# Patient Record
Sex: Female | Born: 1965 | Race: Black or African American | Hispanic: No | Marital: Married | Smoking: Never smoker
Health system: Southern US, Community
[De-identification: ages and names within clinical notes are randomized; demographics above are authoritative.]

## PROBLEM LIST (undated history)

## (undated) DIAGNOSIS — N3281 Overactive bladder: Secondary | ICD-10-CM

## (undated) DIAGNOSIS — L988 Other specified disorders of the skin and subcutaneous tissue: Secondary | ICD-10-CM

## (undated) DIAGNOSIS — D649 Anemia, unspecified: Secondary | ICD-10-CM

## (undated) DIAGNOSIS — F32A Depression, unspecified: Secondary | ICD-10-CM

## (undated) DIAGNOSIS — K602 Anal fissure, unspecified: Secondary | ICD-10-CM

## (undated) DIAGNOSIS — F329 Major depressive disorder, single episode, unspecified: Secondary | ICD-10-CM

## (undated) DIAGNOSIS — F419 Anxiety disorder, unspecified: Secondary | ICD-10-CM

## (undated) DIAGNOSIS — M1990 Unspecified osteoarthritis, unspecified site: Secondary | ICD-10-CM

## (undated) DIAGNOSIS — K625 Hemorrhage of anus and rectum: Secondary | ICD-10-CM

## (undated) HISTORY — DX: Unspecified osteoarthritis, unspecified site: M19.90

## (undated) HISTORY — PX: APPENDECTOMY: SHX54

## (undated) HISTORY — PX: COLONOSCOPY: SHX174

## (undated) HISTORY — DX: Anal fissure, unspecified: K60.2

## (undated) HISTORY — DX: Anxiety disorder, unspecified: F41.9

---

## 2009-03-17 ENCOUNTER — Emergency Department (HOSPITAL_BASED_OUTPATIENT_CLINIC_OR_DEPARTMENT_OTHER): Admission: EM | Admit: 2009-03-17 | Discharge: 2009-03-17 | Payer: Self-pay | Admitting: Emergency Medicine

## 2009-03-19 ENCOUNTER — Emergency Department (HOSPITAL_BASED_OUTPATIENT_CLINIC_OR_DEPARTMENT_OTHER): Admission: EM | Admit: 2009-03-19 | Discharge: 2009-03-19 | Payer: Self-pay | Admitting: Emergency Medicine

## 2009-03-20 ENCOUNTER — Emergency Department (HOSPITAL_BASED_OUTPATIENT_CLINIC_OR_DEPARTMENT_OTHER): Admission: EM | Admit: 2009-03-20 | Discharge: 2009-03-20 | Payer: Self-pay | Admitting: Emergency Medicine

## 2009-05-09 ENCOUNTER — Emergency Department (HOSPITAL_BASED_OUTPATIENT_CLINIC_OR_DEPARTMENT_OTHER): Admission: EM | Admit: 2009-05-09 | Discharge: 2009-05-09 | Payer: Self-pay | Admitting: Emergency Medicine

## 2009-09-15 ENCOUNTER — Emergency Department (HOSPITAL_BASED_OUTPATIENT_CLINIC_OR_DEPARTMENT_OTHER): Admission: EM | Admit: 2009-09-15 | Discharge: 2009-09-15 | Payer: Self-pay | Admitting: Emergency Medicine

## 2010-07-13 LAB — URINALYSIS, ROUTINE W REFLEX MICROSCOPIC
Bilirubin Urine: NEGATIVE
Glucose, UA: NEGATIVE mg/dL
Hgb urine dipstick: NEGATIVE
Ketones, ur: NEGATIVE mg/dL
Protein, ur: NEGATIVE mg/dL
Urobilinogen, UA: 0.2 mg/dL (ref 0.0–1.0)

## 2010-07-13 LAB — BASIC METABOLIC PANEL
BUN: 7 mg/dL (ref 6–23)
CO2: 29 mEq/L (ref 19–32)
Calcium: 8.7 mg/dL (ref 8.4–10.5)
Chloride: 102 mEq/L (ref 96–112)
Creatinine, Ser: 0.8 mg/dL (ref 0.4–1.2)
GFR calc Af Amer: >60 mL/min (ref >60)
Glucose, Bld: 97 mg/dL (ref 70–99)

## 2010-07-13 LAB — CBC
MCHC: 31.8 g/dL (ref 30.0–36.0)
MCV: 75.6 fL — ABNORMAL LOW (ref 78.0–100.0)
RBC: 5.48 MIL/uL — ABNORMAL HIGH (ref 3.87–5.11)
RDW: 15.3 % (ref 11.5–15.5)

## 2010-07-13 LAB — DIFFERENTIAL
Basophils Absolute: 0.2 10*3/uL — ABNORMAL HIGH (ref 0.0–0.1)
Basophils Relative: 3 % — ABNORMAL HIGH (ref 0–1)
Eosinophils Absolute: 0 10*3/uL (ref 0.0–0.7)
Monocytes Relative: 6 % (ref 3–12)
Neutro Abs: 5.2 10*3/uL (ref 1.7–7.7)
Neutrophils Relative %: 83 % — ABNORMAL HIGH (ref 43–77)

## 2010-07-30 LAB — CULTURE, ROUTINE-ABSCESS: Gram Stain: NONE SEEN

## 2010-11-22 ENCOUNTER — Other Ambulatory Visit: Payer: Self-pay

## 2010-11-22 ENCOUNTER — Encounter: Payer: Self-pay | Admitting: *Deleted

## 2010-11-22 ENCOUNTER — Emergency Department (INDEPENDENT_AMBULATORY_CARE_PROVIDER_SITE_OTHER): Payer: PRIVATE HEALTH INSURANCE

## 2010-11-22 ENCOUNTER — Emergency Department (HOSPITAL_BASED_OUTPATIENT_CLINIC_OR_DEPARTMENT_OTHER)
Admission: EM | Admit: 2010-11-22 | Discharge: 2010-11-22 | Disposition: A | Discharge status: Home / Self Care | Payer: PRIVATE HEALTH INSURANCE | Attending: Emergency Medicine | Admitting: Emergency Medicine

## 2010-11-22 DIAGNOSIS — R51 Headache: Secondary | ICD-10-CM | POA: Insufficient documentation

## 2010-11-22 DIAGNOSIS — I10 Essential (primary) hypertension: Secondary | ICD-10-CM | POA: Insufficient documentation

## 2010-11-22 LAB — BASIC METABOLIC PANEL
CO2: 26 mEq/L (ref 19–32)
Chloride: 108 mEq/L (ref 96–112)
GFR calc Af Amer: >60 mL/min (ref >60)
Potassium: 4.7 mEq/L (ref 3.5–5.1)

## 2010-11-22 MED ORDER — TRIAMTERENE-HCTZ 37.5-25 MG PO TABS: 1.0000 | ORAL_TABLET | Freq: Every day | ORAL | Status: DC

## 2010-11-22 MED ORDER — ACETAMINOPHEN 500 MG PO TABS
1000.0000 mg | ORAL_TABLET | Freq: Once | ORAL | Status: AC
  Administered 2010-11-22: 1000 mg via ORAL
  Filled 2010-11-22: qty 2

## 2010-11-22 NOTE — ED Notes (Signed)
Patient states she was taking triamterene/hctz and ran out last week, c/o HA and legs swelling since that time

## 2010-11-22 NOTE — ED Provider Notes (Addendum)
History     Chief Complaint  Patient presents with  . Headache    C/O HA & legs swelling   Patient is a 45 y.o. female presenting with headaches. The history is provided by the patient.  Headache  This is a recurrent problem. The current episode started more than 1 week ago. The problem occurs constantly. The problem has been gradually worsening. The headache is associated with nothing. The pain is located in the bilateral region. The quality of the pain is described as dull and throbbing. The pain is at a severity of 5/10. The pain is moderate. The pain does not radiate. Pertinent negatives include no anorexia, no fever, no malaise/fatigue, no chest pressure, no near-syncope, no shortness of breath, no nausea and no vomiting. She has tried nothing for the symptoms.   Pt says that she is out of her BP/fluid pill for awhile.  Is having increased swelling to legs which she gets when she is out of her meds.  Also bifrontal h/a which she has had off/on for six month.  Usually worse when her BP is high, but has not been seen for her h/a in past.  No the worst h/a of life.  No trauma, no fever.  No cp or sob Past Medical History  Diagnosis Date  . Hypertension     Past Surgical History  Procedure Date  . Appendectomy     History reviewed. No pertinent family history.  History  Substance Use Topics  . Smoking status: Never Smoker   . Smokeless tobacco: Not on file  . Alcohol Use: Yes    OB History    Grav Para Term Preterm Abortions TAB SAB Ect Mult Living                  Review of Systems  Constitutional: Negative for fever, malaise/fatigue, diaphoresis and fatigue.  HENT: Negative for congestion and facial swelling.   Eyes: Negative.   Respiratory: Negative.  Negative for shortness of breath.   Cardiovascular: Negative.  Negative for near-syncope.  Gastrointestinal: Negative.  Negative for nausea, vomiting and anorexia.  Genitourinary: Negative.   Musculoskeletal: Negative.    Skin: Negative.   Neurological: Positive for headaches. Negative for dizziness, speech difficulty, weakness and numbness.    Physical Exam  BP 124/85  Pulse 70  Temp(Src) 98.1 F (36.7 C) (Oral)  Resp 14  SpO2 100%  Physical Exam  Constitutional: She is oriented to person, place, and time. She appears well-developed and well-nourished.  HENT:  Head: Normocephalic and atraumatic.  Eyes: EOM are normal. Pupils are equal, round, and reactive to light.  Neck: Normal range of motion.  Cardiovascular: Normal rate, regular rhythm and normal heart sounds.   Pulmonary/Chest: Breath sounds normal.  Abdominal: Soft. Bowel sounds are normal.  Musculoskeletal: Normal range of motion. She exhibits edema.  Neurological: She is alert and oriented to person, place, and time.  Skin: Skin is warm and dry.    ED Course  Procedures EKG: normal EKG, normal sinus rhythm, unchanged from previous tracings, there are no previous tracings available for comparison, normal sinus rhythm, no ST/T wave changes, normal axis.  HR 51.  MDM CT head negative.  Nothing to suggest ICH or meningitis.  Will start back on BP med.  Has appt on August to see PCP      Rolan Bucco, MD 11/22/10 1155  Rolan Bucco, MD 11/22/10 1610  Rolan Bucco, MD 11/22/10 1234

## 2010-11-22 NOTE — Discharge Instructions (Signed)
hyper

## 2011-02-09 ENCOUNTER — Ambulatory Visit (INDEPENDENT_AMBULATORY_CARE_PROVIDER_SITE_OTHER): Payer: No Typology Code available for payment source | Admitting: Family Medicine

## 2011-02-09 ENCOUNTER — Encounter: Payer: Self-pay | Admitting: Family Medicine

## 2011-02-09 VITALS — BP 118/82 | HR 80 | Temp 98.5°F | Resp 16 | Ht 66.0 in | Wt 171.0 lb

## 2011-02-09 DIAGNOSIS — R609 Edema, unspecified: Secondary | ICD-10-CM

## 2011-02-09 LAB — POCT URINALYSIS DIPSTICK
Bilirubin, UA: NEGATIVE
Leukocytes, UA: NEGATIVE
Nitrite, UA: NEGATIVE
Urobilinogen, UA: 0.2
pH, UA: 5

## 2011-02-09 MED ORDER — TRIAMTERENE-HCTZ 37.5-25 MG PO TABS: 1.0000 | ORAL_TABLET | Freq: Every day | ORAL | Status: DC

## 2011-02-09 NOTE — Patient Instructions (Signed)
Edema Edema is an abnormal build-up of fluids in tissues. Because this is partly dependent on gravity (water flows to the lowest place), it is more common in the lower extremities (legs and thighs). It is also common in the looser tissues, like around the eyes. Painless swelling of the feet and ankles is common and increases as a person ages. It may affect both legs and may include the calves or even thighs. When squeezed, the fluid may move out of the affected area and may leave a dent for a few moments. CAUSES  Prolonged standing or sitting in one place for extended periods of time. Movement helps pump tissue fluid into the veins, and absence of movement prevents this, resulting in edema.   Varicose veins. The valves in the veins do not work as well as they should. This causes fluid to leak into the tissues.   Fluid and salt overload.   Injury, burn, or surgery to the leg, ankle, or foot, may damage veins and allow fluid to leak out.   Sunburn damages vessels. Leaky vessels allow fluid to go out into the sunburned tissues.   Allergies (from insect bites or stings, medications or chemicals) cause swelling by allowing vessels to become leaky.   Protein in the blood helps keep fluid in your vessels. Low protein, as in malnutrition, allows fluid to leak out.   Hormonal changes, including pregnancy and menstruation, cause fluid retention. This fluid may leak out of vessels and cause edema.   Medications that cause fluid retention. Examples are sex hormones, blood pressure medications, steroid treatment, or anti-depressants.   Some illnesses cause edema, especially heart failure, kidney disease, or liver disease.   Surgery that cuts veins or lymph nodes, such as surgery done for the heart or for breast cancer, may result in edema.  DIAGNOSIS Your caregiver is usually easily able to determine what is causing your swelling (edema) by simply asking what is wrong (getting a history) and examining  you (doing a physical). Sometimes x-rays, EKG (electrocardiogram or heart tracing), and blood work may be done to evaluate for underlying medical illness. TREATMENT General treatment includes:  Leg elevation (or elevation of the affected body part).   Restriction of fluid intake.   Prevention of fluid overload.   Compression of the affected body part. Compression with elastic bandages or support stockings squeezes the tissues, preventing fluid from entering and forcing it back into the blood vessels.   Diuretics (also called water pills or fluid pills) pull fluid out of your body in the form of increased urination. These are effective in reducing the swelling, but can have side effects and must be used only under your caregiver's supervision. Diuretics are appropriate only for some types of edema.  The specific treatment can be directed at any underlying causes discovered. Heart, liver, or kidney disease should be treated appropriately. HOME CARE INSTRUCTIONS  Elevate the legs (or affected body part) above the level of the heart, while lying down.   Avoid sitting or standing still for prolonged periods of time.   Avoid putting anything directly under the knees when lying down, and do not wear constricting clothing or garters on the upper legs.   Exercising the legs causes the fluid to work back into the veins and lymphatic channels. This may help the swelling go down.   The pressure applied by elastic bandages or support stockings can help reduce ankle swelling.   A low-salt diet may help reduce fluid retention and decrease the   ankle swelling.   Take any medications exactly as prescribed.  SEEK MEDICAL CARE IF:  Your edema is not responding to recommended treatments.  SEEK IMMEDIATE MEDICAL CARE IF:  You develop shortness of breath or chest pain.   You cannot breathe when you lay down; or if, while lying down, you have to get up and go to the window to get your breath.   You are  having increasing swelling without relief from treatment.   You develop a fever over 100.4.   You develop pain or redness in the areas that are swollen.   Tell your caregiver right away if you have gained 1-2 lbs in 1 day or  5 lbs in a week.  MAKE SURE YOU:  Understand these instructions.   Will watch your condition.   Will get help right away if you are not doing well or get worse.  Document Released: 04/13/2005 Document Re-Released: 10/01/2009 [employer] Patient Information 2011 [employer]. 

## 2011-02-09 NOTE — Progress Notes (Signed)
  Subjective:    Jo Compton is a 45 y.o. female who presents for follow-up of congestive heart failure. Current symptoms include: dyspnea and lower extremity edema. She denies chest pain, chest pressure/discomfort, claudication, exertional chest pressure/discomfort, fatigue, irregular heart beat, near-syncope, orthopnea, palpitations, paroxysmal nocturnal dyspnea, syncope and tachypnea. She states she is compliant all of the time with her medications. She states she is noncompliant much of the time with her diet.  The following portions of the patient's history were reviewed and updated as appropriate: allergies, current medications, past family history, past medical history, past social history, past surgical history and problem list.  Review of Systems Pertinent items are noted in HPI.   Objective:    BP 118/82  Pulse 80  Temp(Src) 98.5 F (36.9 C) (Oral)  Resp 16  Ht 5\' 6"  (1.676 m)  Wt 171 lb (77.565 kg)  BMI 27.60 kg/m2 General appearance: alert, cooperative, appears stated age and no distress Neck: no adenopathy, no carotid bruit, no JVD, supple, symmetrical, trachea midline and thyroid not enlarged, symmetric, no tenderness/mass/nodules Lungs: clear to auscultation bilaterally Extremities: edema trace left > right   Assessment:    Lower extremity edema  Plan:    Emphasized salt restriction. Encouraged daily monitoring of the patient's weight. Encouraged regular exercise. maxzide

## 2011-02-10 LAB — CBC WITH DIFFERENTIAL/PLATELET
Basophils Relative: 0.3 % (ref 0.0–3.0)
Eosinophils Absolute: 0 10*3/uL (ref 0.0–0.7)
Eosinophils Relative: 0.7 % (ref 0.0–5.0)
Lymphocytes Relative: 46.3 % — ABNORMAL HIGH (ref 12.0–46.0)
MCHC: 31.7 g/dL (ref 30.0–36.0)
Neutrophils Relative %: 45.2 % (ref 43.0–77.0)
RBC: 4.69 Mil/uL (ref 3.87–5.11)
WBC: 4.7 10*3/uL (ref 4.5–10.5)

## 2011-02-10 LAB — LIPID PANEL
HDL: 57.9 mg/dL (ref >39.00)
LDL Cholesterol: 95 mg/dL (ref 0–99)
Total CHOL/HDL Ratio: 3

## 2011-02-10 LAB — HEPATIC FUNCTION PANEL
ALT: 21 U/L (ref 0–35)
AST: 22 U/L (ref 0–37)
Albumin: 3.8 g/dL (ref 3.5–5.2)
Alkaline Phosphatase: 51 U/L (ref 39–117)
Bilirubin, Direct: 0 mg/dL (ref 0.0–0.3)
Total Bilirubin: 0.4 mg/dL (ref 0.3–1.2)
Total Protein: 6.9 g/dL (ref 6.0–8.3)

## 2011-02-10 LAB — BASIC METABOLIC PANEL WITH GFR
BUN: 10 mg/dL (ref 6–23)
CO2: 27 meq/L (ref 19–32)
Calcium: 8.7 mg/dL (ref 8.4–10.5)
Chloride: 106 meq/L (ref 96–112)
Creatinine, Ser: 0.8 mg/dL (ref 0.4–1.2)
GFR: 78.54 mL/min
Glucose, Bld: 86 mg/dL (ref 70–99)
Potassium: 4.2 meq/L (ref 3.5–5.1)
Sodium: 138 meq/L (ref 135–145)

## 2011-02-10 LAB — TSH: TSH: 0.74 u[IU]/mL (ref 0.35–5.50)

## 2011-03-02 ENCOUNTER — Emergency Department (INDEPENDENT_AMBULATORY_CARE_PROVIDER_SITE_OTHER): Payer: PRIVATE HEALTH INSURANCE

## 2011-03-02 ENCOUNTER — Encounter (HOSPITAL_BASED_OUTPATIENT_CLINIC_OR_DEPARTMENT_OTHER): Payer: Self-pay | Admitting: *Deleted

## 2011-03-02 ENCOUNTER — Emergency Department (HOSPITAL_BASED_OUTPATIENT_CLINIC_OR_DEPARTMENT_OTHER)
Admission: EM | Admit: 2011-03-02 | Discharge: 2011-03-02 | Disposition: A | Discharge status: Home / Self Care | Payer: PRIVATE HEALTH INSURANCE | Attending: Emergency Medicine | Admitting: Emergency Medicine

## 2011-03-02 DIAGNOSIS — I10 Essential (primary) hypertension: Secondary | ICD-10-CM | POA: Insufficient documentation

## 2011-03-02 DIAGNOSIS — M79669 Pain in unspecified lower leg: Secondary | ICD-10-CM

## 2011-03-02 DIAGNOSIS — M79609 Pain in unspecified limb: Secondary | ICD-10-CM

## 2011-03-02 DIAGNOSIS — R209 Unspecified disturbances of skin sensation: Secondary | ICD-10-CM

## 2011-03-02 LAB — CBC
HCT: 33.7 % — ABNORMAL LOW (ref 36.0–46.0)
Hemoglobin: 10.4 g/dL — ABNORMAL LOW (ref 12.0–15.0)
MCV: 73.4 fL — ABNORMAL LOW (ref 78.0–100.0)
WBC: 6.3 10*3/uL (ref 4.0–10.5)

## 2011-03-02 LAB — BASIC METABOLIC PANEL
BUN: 9 mg/dL (ref 6–23)
CO2: 26 mEq/L (ref 19–32)
Chloride: 102 mEq/L (ref 96–112)
Glucose, Bld: 100 mg/dL — ABNORMAL HIGH (ref 70–99)
Potassium: 3.7 mEq/L (ref 3.5–5.1)

## 2011-03-02 NOTE — ED Provider Notes (Signed)
History    Scribed for Geoffery Lyons, MD, the patient was seen in room MH09/MH09. This chart was scribed by Katha Cabal.   CSN: [id number] Arrival date & time: 03/02/2011  3:56 PM   First MD Initiated Contact with Patient 03/02/11 1558      Chief Complaint  Patient presents with  . Leg Pain    (Consider location/radiation/quality/duration/timing/severity/associated sxs/prior treatment) HPI Jo Compton is a 45 y.o. female who presents to the Emergency Department complaining of intermittent moderate to severe bilateral LE pain over the last couple days.  Patient reports that is worse in left LE.  Patient denies leg swelling, weakness and numbness.  Pain is associated with intermittent tingling in feet. Pain is described as cramping spasms. Pain has improved.  Patient reports pain is worse at night.   Pain is aggravated by walking.  Patient reports no exertion activities.  There is no history of smoking.  Patient is not taking OCPs.  Patient has history of HTN and is compliant with medication.     Past Medical History  Diagnosis Date  . Hypertension     Past Surgical History  Procedure Date  . Appendectomy     No family history on file.  History  Substance Use Topics  . Smoking status: Never Smoker   . Smokeless tobacco: Not on file  . Alcohol Use: Yes    OB History    Grav Para Term Preterm Abortions TAB SAB Ect Mult Living                  Review of Systems  All other systems reviewed and are negative.    Allergies  Review of patient's allergies indicates no known allergies.  Home Medications   Current Outpatient Rx  Name Route Sig Dispense Refill  . FERROUS SULFATE 325 (65 FE) MG PO TABS Oral Take 325 mg by mouth daily with breakfast.      . IBUPROFEN 200 MG PO TABS Oral Take 200 mg by mouth every 6 (six) hours as needed.      . TRIAMTERENE-HCTZ 37.5-25 MG PO TABS Oral Take 1 tablet by mouth daily. 30 tablet 0    BP 103/66  Pulse 67  Temp(Src) 97.8  F (36.6 C) (Oral)  Resp 17  Ht 5\' 6"  (1.676 m)  Wt 174 lb (78.926 kg)  BMI 28.08 kg/m2  SpO2 99%  LMP 02/22/2011  Physical Exam  Constitutional: She is oriented to person, place, and time. She appears well-developed and well-nourished. No distress.  HENT:  Head: Normocephalic and atraumatic.  Eyes: Conjunctivae and EOM are normal.  Neck: Normal range of motion. Neck supple.  Cardiovascular: Normal rate and intact distal pulses.        DP Pulse is palpable   Pulmonary/Chest: Effort normal. No respiratory distress.  Abdominal: Soft. There is no tenderness.  Musculoskeletal: Normal range of motion. She exhibits tenderness. She exhibits no edema.       No Homans sign present, no palpable cord behind the knee, soft tissue tenderness in left calf   Neurological: She is alert and oriented to person, place, and time.  Skin: Skin is warm and dry. No rash noted.  Psychiatric: She has a normal mood and affect. Her behavior is normal.    ED Course  Procedures (including critical care time)   DIAGNOSTIC STUDIES: Oxygen Saturation is 99% on room air, normal by my interpretation.    COORDINATION OF CARE:  4:06 PM  Physical exam complete.  Will  check for electrolyte imbalances.  Will order left LE ultrasound.  5:41 PM  Reviewed ultrasound.  No evidence of DVT in LLE.   5:43 PM  Discussed results of ultrasound with patient.  Plan to discharge patient.  Patient agrees with plan.     Orders Placed This Encounter  Procedures  . US Venous Img Lower Unilateral Left  . CBC  . Basic metabolic panel     LABS / RADIOLOGY:   Labs Reviewed  CBC - Abnormal; Notable for the following:    Hemoglobin 10.4 (*)    HCT 33.7 (*)    MCV 73.4 (*)    MCH 22.7 (*)    RDW 20.1 (*)    All other components within normal limits  BASIC METABOLIC PANEL - Abnormal; Notable for the following:    Glucose, Bld 100 (*)    All other components within normal limits   US Venous Img Lower Unilateral  Left  03/02/2011  *RADIOLOGY REPORT*  Clinical Data: Left leg pain with muscle spasms and tingling for 3- 4 days question deep venous thrombosis, history hypertension  LEFT LOWER EXTREMITY VENOUS DUPLEX ULTRASOUND  Technique:  Gray-scale sonography with graded compression, as well as color Doppler and duplex ultrasound, were performed to evaluate the deep venous system of the lower extremity from the level of the common femoral vein through the popliteal and proximal calf veins. Spectral Doppler was utilized to evaluate flow at rest and with distal augmentation maneuvers.  Comparison:  None  Findings: Deep venous system patent and compressible from left groin through popliteal fossa. Spontaneous venous flow present with intact augmentation and evidence of respiratory phasicity. No intraluminal thrombus identified. Visualized portion of the greater saphenous system unremarkable.  IMPRESSION: No evidence of deep venous thrombosis in left lower extremity.  Original Report Authenticated By: Lollie Marrow, M.D.         MDM   MDM: Labs okay, ultrasound negative for dvt.  Appears to be muscle strain.  Will treat with nsaids, rest.  Follow up prn.     MEDICATIONS GIVEN IN THE E.D. Scheduled Meds:   Continuous Infusions:       IMPRESSION: No diagnosis found.   DISCHARGE MEDICATIONS: New Prescriptions   No medications on file      I personally performed the services described in this documentation, which was scribed in my presence. The recorded information has been reviewed and considered.   Scribe            Geoffery Lyons, MD 03/02/11 [phone]

## 2011-03-02 NOTE — ED Notes (Signed)
Pt c/o bilat lower leg cramping that began over the weekend. Pain is currently in left lower leg and is worse with walking.

## 2011-03-02 NOTE — ED Notes (Signed)
Pt acuity changed from 4 to 3 based on plan of care. 

## 2011-03-13 ENCOUNTER — Ambulatory Visit: Payer: No Typology Code available for payment source | Admitting: Family Medicine

## 2011-03-13 ENCOUNTER — Encounter (HOSPITAL_BASED_OUTPATIENT_CLINIC_OR_DEPARTMENT_OTHER): Payer: Self-pay | Admitting: *Deleted

## 2011-03-13 DIAGNOSIS — Z0289 Encounter for other administrative examinations: Secondary | ICD-10-CM

## 2011-04-13 ENCOUNTER — Telehealth: Payer: Self-pay | Admitting: Family Medicine

## 2011-04-13 ENCOUNTER — Encounter (HOSPITAL_BASED_OUTPATIENT_CLINIC_OR_DEPARTMENT_OTHER): Payer: Self-pay | Admitting: *Deleted

## 2011-04-13 ENCOUNTER — Emergency Department (HOSPITAL_BASED_OUTPATIENT_CLINIC_OR_DEPARTMENT_OTHER)
Admission: EM | Admit: 2011-04-13 | Discharge: 2011-04-13 | Disposition: A | Discharge status: Home / Self Care | Payer: PRIVATE HEALTH INSURANCE | Attending: Emergency Medicine | Admitting: Emergency Medicine

## 2011-04-13 DIAGNOSIS — K625 Hemorrhage of anus and rectum: Secondary | ICD-10-CM | POA: Insufficient documentation

## 2011-04-13 DIAGNOSIS — I10 Essential (primary) hypertension: Secondary | ICD-10-CM | POA: Insufficient documentation

## 2011-04-13 LAB — CBC
HCT: 36.9 % (ref 36.0–46.0)
Hemoglobin: 11.9 g/dL — ABNORMAL LOW (ref 12.0–15.0)
MCH: 24.3 pg — ABNORMAL LOW (ref 26.0–34.0)
MCHC: 32.2 g/dL (ref 30.0–36.0)
MCV: 75.3 fL — ABNORMAL LOW (ref 78.0–100.0)

## 2011-04-13 LAB — COMPREHENSIVE METABOLIC PANEL
ALT: 15 U/L (ref 0–35)
BUN: 16 mg/dL (ref 6–23)
CO2: 25 mEq/L (ref 19–32)
Calcium: 9 mg/dL (ref 8.4–10.5)
Creatinine, Ser: 0.9 mg/dL (ref 0.50–1.10)
GFR calc Af Amer: 89 mL/min — ABNORMAL LOW (ref >90)
GFR calc non Af Amer: 77 mL/min — ABNORMAL LOW (ref >90)
Glucose, Bld: 91 mg/dL (ref 70–99)
Sodium: 136 mEq/L (ref 135–145)

## 2011-04-13 NOTE — Telephone Encounter (Signed)
Patient is having rectal bleeding but she is also having her period - she doesn't have  A bowel movement every day she wondered if it could be a hemmroid

## 2011-04-13 NOTE — ED Provider Notes (Signed)
History     CSN: [id number] Arrival date & time: 04/13/2011  7:28 PM   First MD Initiated Contact with Patient 04/13/11 1932      Chief Complaint  Patient presents with  . Rectal Bleeding    (Consider location/radiation/quality/duration/timing/severity/associated sxs/prior treatment) HPI History provided by pt.   Pt has had bright red blood in stool for the past week.  Associated w/ nausea.  Denies fever, abd pain, rectal pain, SOB, weakness, lightheadedness.  No GU sx; currently has her period but started yesterday.  Had colonoscopy 3-4 years ago that was normal.    Past Medical History  Diagnosis Date  . Hypertension     Past Surgical History  Procedure Date  . Appendectomy     Family History  Problem Relation Age of Onset  . Hypertension Mother   . COPD Mother   . Hypertension Maternal Aunt   . Cancer Maternal Uncle     colon  . Cancer Maternal Uncle     liver    History  Substance Use Topics  . Smoking status: Never Smoker   . Smokeless tobacco: Not on file  . Alcohol Use: Yes    OB History    Grav Para Term Preterm Abortions TAB SAB Ect Mult Living                  Review of Systems  All other systems reviewed and are negative.    Allergies  Review of patient's allergies indicates no known allergies.  Home Medications   Current Outpatient Rx  Name Route Sig Dispense Refill  . FERROUS SULFATE 325 (65 FE) MG PO TABS Oral Take 325 mg by mouth daily with breakfast.      . IBUPROFEN 200 MG PO TABS Oral Take 200 mg by mouth every 6 (six) hours as needed. For pain    . TRIAMTERENE-HCTZ 37.5-25 MG PO TABS Oral Take 1 each (1 tablet total) by mouth daily. 30 tablet 5    BP 118/86  Pulse 65  Temp(Src) 98.4 F (36.9 C) (Oral)  Resp 16  Ht 5\' 7"  (1.702 m)  Wt 170 lb (77.111 kg)  BMI 26.63 kg/m2  SpO2 100%  LMP 04/12/2011  Physical Exam  Nursing note and vitals reviewed. Constitutional: She is oriented to person, place, and time. She appears  well-developed and well-nourished. No distress.  HENT:  Head: Normocephalic and atraumatic.  Eyes: Conjunctivae are normal.       Normal appearance  Neck: Normal range of motion.  Cardiovascular: Normal rate and regular rhythm.   Pulmonary/Chest: Effort normal and breath sounds normal.  Abdominal: Soft. Bowel sounds are normal. She exhibits no distension. There is no tenderness.  Genitourinary:       Nml rectal tone.  Non-tender.  Stool color nml and no gross blood.   Neurological: She is alert and oriented to person, place, and time.  Skin: Skin is warm and dry. No rash noted.  Psychiatric: She has a normal mood and affect. Her behavior is normal.    ED Course  Procedures (including critical care time)  Labs Reviewed  CBC - Abnormal; Notable for the following:    Hemoglobin 11.9 (*)    MCV 75.3 (*)    MCH 24.3 (*)    RDW 19.1 (*)    All other components within normal limits  COMPREHENSIVE METABOLIC PANEL - Abnormal; Notable for the following:    Total Bilirubin 0.2 (*)    GFR calc non Af Amer 77 (*)  GFR calc Af Amer 89 (*)    All other components within normal limits  OCCULT BLOOD X 1 CARD TO LAB, STOOL   No results found.   1. Rectal bleeding       MDM  Pt presents w/ 1wk rectal bleeding w/ associated nausea only.  Nml colonoscopy 3-61yrs ago.  No gross blood in rectum and abd benign and non-tender on exam.   Hemoccult pos.  Hgb stable and .  Pt has a gastroenterologist in [place] but referred to [facility] as well.  She also has a PCP to f/u with.  Strict return precautions discussed.        Otilio Miu, [place] 04/13/11 2129

## 2011-04-13 NOTE — ED Notes (Signed)
Pt c/o bright red rectal bleeding with clots  X 1 week. sched appt  tomorrow with pmd.

## 2011-04-13 NOTE — Telephone Encounter (Signed)
Discussed with patient and she stated she has seen blood from the rectum x's 1 week when wiping. She stated she has been constipated some but had some relief from prune juice. She is currently on her menstrual cycle and she was not sure if she is still having rectal bleeding. Apt scheduled for tomorrow at 1:30 for an evaluation    KP

## 2011-04-14 ENCOUNTER — Ambulatory Visit: Payer: Self-pay | Admitting: Family Medicine

## 2011-04-14 ENCOUNTER — Telehealth: Payer: Self-pay | Admitting: Internal Medicine

## 2011-04-14 NOTE — ED Provider Notes (Signed)
Medical screening examination/treatment/procedure(s) were performed by non-physician practitioner and as supervising physician I was immediately available for consultation/collaboration.  Annai Heick, MD 04/14/11 0003 

## 2011-04-14 NOTE — Telephone Encounter (Signed)
Reviewed patient's ER record from 04/13/11 for rectal bleeding. She has an appointment today with her PCP Dr. Laury Axon also. Discussed with patient the need for [place] to have her previous GI records from [facility] prior to her visit here so the MD can properly treat her problem. She will keep her PCP appointment today. She will get her previous GI records faxed to [place] and keep the appointment on 04/22/11 with Dr. Christella Hartigan.

## 2011-04-15 NOTE — Telephone Encounter (Signed)
I agree with the disposition 

## 2011-04-22 ENCOUNTER — Ambulatory Visit: Payer: Self-pay | Admitting: Gastroenterology

## 2011-05-22 ENCOUNTER — Telehealth: Payer: Self-pay | Admitting: Gastroenterology

## 2011-05-22 NOTE — Telephone Encounter (Signed)
Message copied by Arna Snipe on Fri May 22, 2011  8:43 AM ------      Message from: Donata Duff      Created: Wed Apr 22, 2011  4:05 PM       Do not charge

## 2011-08-11 ENCOUNTER — Telehealth: Payer: Self-pay | Admitting: Family Medicine

## 2011-08-11 ENCOUNTER — Ambulatory Visit: Payer: Self-pay | Admitting: Internal Medicine

## 2011-08-11 NOTE — Telephone Encounter (Signed)
This is ok to wait for Arman Bogus

## 2011-08-11 NOTE — Telephone Encounter (Signed)
To accommodate patients schedule I put patient in a 4pm appointment for Mole removal 5.28.13. Can you please review the schedule & let me know if that is a problem. If so I will call patient back to re-schedule at a better time for Dr.Lowne

## 2011-08-17 NOTE — Telephone Encounter (Signed)
This is ok.   KP

## 2011-09-19 ENCOUNTER — Emergency Department (HOSPITAL_BASED_OUTPATIENT_CLINIC_OR_DEPARTMENT_OTHER)
Admission: EM | Admit: 2011-09-19 | Discharge: 2011-09-19 | Disposition: A | Discharge status: Home / Self Care | Payer: 59 | Attending: Emergency Medicine | Admitting: Emergency Medicine

## 2011-09-19 ENCOUNTER — Encounter (HOSPITAL_BASED_OUTPATIENT_CLINIC_OR_DEPARTMENT_OTHER): Payer: Self-pay | Admitting: *Deleted

## 2011-09-19 ENCOUNTER — Emergency Department (HOSPITAL_BASED_OUTPATIENT_CLINIC_OR_DEPARTMENT_OTHER): Payer: 59

## 2011-09-19 DIAGNOSIS — R05 Cough: Secondary | ICD-10-CM | POA: Insufficient documentation

## 2011-09-19 DIAGNOSIS — R059 Cough, unspecified: Secondary | ICD-10-CM | POA: Insufficient documentation

## 2011-09-19 DIAGNOSIS — R079 Chest pain, unspecified: Secondary | ICD-10-CM | POA: Insufficient documentation

## 2011-09-19 DIAGNOSIS — I10 Essential (primary) hypertension: Secondary | ICD-10-CM | POA: Insufficient documentation

## 2011-09-19 DIAGNOSIS — J40 Bronchitis, not specified as acute or chronic: Secondary | ICD-10-CM | POA: Insufficient documentation

## 2011-09-19 DIAGNOSIS — R490 Dysphonia: Secondary | ICD-10-CM | POA: Insufficient documentation

## 2011-09-19 MED ORDER — IBUPROFEN 800 MG PO TABS
800.0000 mg | ORAL_TABLET | Freq: Once | ORAL | Status: AC
  Administered 2011-09-19: 800 mg via ORAL
  Filled 2011-09-19: qty 1

## 2011-09-19 MED ORDER — ALBUTEROL SULFATE HFA 108 (90 BASE) MCG/ACT IN AERS
2.0000 | INHALATION_SPRAY | RESPIRATORY_TRACT | Status: DC | PRN
  Administered 2011-09-19: 2 via RESPIRATORY_TRACT
  Filled 2011-09-19: qty 6.7

## 2011-09-19 MED ORDER — OXYMETAZOLINE HCL 0.05 % NA SOLN
1.0000 | Freq: Once | NASAL | Status: AC
  Administered 2011-09-19: 1 via NASAL
  Filled 2011-09-19: qty 15

## 2011-09-19 NOTE — ED Provider Notes (Signed)
History   This chart was scribed for Jo Chick, MD by Shari Heritage. The patient was seen in room MH01/MH01. Patient's care was started at 2110.     CSN: [id number]  Arrival date & time 09/19/11  2110   First MD Initiated Contact with Patient 09/19/11 2211      Chief Complaint  Patient presents with  . Sore Throat  . Nasal Congestion  . Chest Pain    (Consider location/radiation/quality/duration/timing/severity/associated sxs/prior treatment) The history is provided by the patient. No language interpreter was used.   JOSLIN DOELL is a 46 y.o. female who presents to the Emergency Department complaining of productive cough onset 3 days ago with associated nausea, nasal congestion, sore throat and chest pain. Patient says she has been producing green and yellow mucous especially in the morning. Patient says that nasal congestion makes breathing a little more difficult. Patient also reports general aches. Patient has taken Tylenol, Claritin and Robitussin with no relief. Patient denies vomiting. Patient with h/o of HTN and appendectomy. Patient has never smoked. Patient w/o hx of asthma.  Past Medical History  Diagnosis Date  . Hypertension     Past Surgical History  Procedure Date  . Appendectomy     Family History  Problem Relation Age of Onset  . Hypertension Mother   . COPD Mother   . Hypertension Maternal Aunt   . Cancer Maternal Uncle     colon  . Cancer Maternal Uncle     liver    History  Substance Use Topics  . Smoking status: Never Smoker   . Smokeless tobacco: Never Used  . Alcohol Use: 0.6 oz/week    1 Glasses of wine per week    OB History    Grav Para Term Preterm Abortions TAB SAB Ect Mult Living                  Review of Systems A complete 10 system review of systems was obtained and all systems are negative except as noted in the HPI and PMH.   Allergies  Review of patient's allergies indicates no known allergies.  Home Medications    Current Outpatient Rx  Name Route Sig Dispense Refill  . ACETAMINOPHEN 500 MG PO TABS Oral Take 1,000 mg by mouth every 6 (six) hours as needed. Patient used this medication for headache and body aches.    Marland Kitchen DEXTROMETHORPHAN HBR 15 MG/5ML PO SYRP Oral Take 10 mLs by mouth 4 (four) times daily as needed. Patient used this medication for cold symptoms.    Di Kindle SULFATE 325 (65 FE) MG PO TABS Oral Take 325 mg by mouth daily with breakfast.      . CLARITIN PO Oral Take 1 tablet by mouth daily as needed. Patient used this medication for allergies.    . TRIAMTERENE-HCTZ 37.5-25 MG PO TABS Oral Take 1 each (1 tablet total) by mouth daily. 30 tablet 5    BP 107/80  Pulse 85  Temp(Src) 98.4 F (36.9 C) (Oral)  Resp 18  Ht 5\' 6"  (1.676 m)  Wt 175 lb (79.379 kg)  BMI 28.25 kg/m2  SpO2 100%  LMP 09/06/2011  Physical Exam  Nursing note and vitals reviewed. Constitutional: She is oriented to person, place, and time. She appears well-developed and well-nourished.  HENT:  Head: Normocephalic and atraumatic.  Mouth/Throat: Oropharynx is clear and moist.       No redness in throat. Voice is hoarse.  Eyes: Conjunctivae and EOM are  normal. Pupils are equal, round, and reactive to light.  Neck: Normal range of motion. Neck supple.  Cardiovascular: Normal rate and regular rhythm.   Pulmonary/Chest: Effort normal and breath sounds normal.  Abdominal: Soft. Bowel sounds are normal.  Musculoskeletal: Normal range of motion.       No leg swelling.  Neurological: She is alert and oriented to person, place, and time.  Skin: Skin is warm and dry.  Psychiatric: She has a normal mood and affect.    ED Course  Procedures (including critical care time) DIAGNOSTIC STUDIES: Oxygen Saturation is 100% on room air, normal by my interpretation.   BP- 107/80 Pulse- 85  COORDINATION OF CARE: 11:00PM- Patient informed of current plan for treatment and evaluation. Will prescribe an Albuterol inhaler and  afrin. Suggested that pt take Ibuprofen for body aches.   Date: 09/19/2011  Rate: 89  Rhythm: normal sinus rhythm  QRS Axis: normal  Intervals: normal  ST/T Wave abnormalities: normal  Conduction Disutrbances: none  Narrative Interpretation: unremarkable        Labs Reviewed - No data to display Dg Chest 2 View  09/19/2011  *RADIOLOGY REPORT*  Clinical Data: Chest pain, cough, congestion  CHEST - 2 VIEW  Comparison: None.  Findings: The heart size and pulmonary vascularity are normal. The lungs appear clear and expanded without focal air space disease or consolidation. No blunting of the costophrenic angles.  No pneumothorax.  IMPRESSION: No evidence of active pulmonary disease.  Original Report Authenticated By: Marlon Pel, M.D.     1. Bronchitis       MDM  Pt presenting with c/o body aches, productive cough and nasal congestion.  No fever.  CXR reassuring- lungs clear.  Pt overall well hydrated and nontoxic on exam.  Pt given albuterol MDI for likely bronchitis, as well as afrin nasal spray for congestion, ibuprofen for body aches.  Pt discharged with stirct return precuations.  She is agreeable with this plan.      I personally performed the services described in this documentation, which was scribed in my presence. The recorded information has been reviewed and considered.    Jo Chick, MD 09/20/11 [phone]

## 2011-09-19 NOTE — ED Notes (Signed)
Pt reports congestion sore throat and chest pain x 3 days- productive cough for green and yellow mucous

## 2011-09-19 NOTE — Discharge Instructions (Signed)
Return to the ED with any concerns including difficulty breathing, vomiting and not able to keep down liquids, decreased level of alertness/lethargy, or any other alarming symptoms  You should use albuterol inhaler 2 puffs every 4-6 hours for cough, also use afrin nasal spray twice daily for no more than 3 days- otherwise this may cause increased congestion.  Please be sure to drink plenty of fluids.

## 2011-09-22 ENCOUNTER — Encounter: Payer: Self-pay | Admitting: Family Medicine

## 2011-09-22 ENCOUNTER — Ambulatory Visit (INDEPENDENT_AMBULATORY_CARE_PROVIDER_SITE_OTHER): Payer: PRIVATE HEALTH INSURANCE | Admitting: Family Medicine

## 2011-09-22 VITALS — BP 120/84 | HR 71 | Temp 98.5°F | Wt 175.2 lb

## 2011-09-22 DIAGNOSIS — R110 Nausea: Secondary | ICD-10-CM

## 2011-09-22 DIAGNOSIS — Q828 Other specified congenital malformations of skin: Secondary | ICD-10-CM

## 2011-09-22 MED ORDER — OMEPRAZOLE MAGNESIUM 20 MG PO TBEC: 20.0000 mg | DELAYED_RELEASE_TABLET | Freq: Every day | ORAL | Status: DC

## 2011-09-22 NOTE — Patient Instructions (Signed)
Diet for GERD or PUD Nutrition therapy can help ease the discomfort of gastroesophageal reflux disease (GERD) and peptic ulcer disease (PUD).  HOME CARE INSTRUCTIONS   Eat your meals slowly, in a relaxed setting.   Eat 5 to 6 small meals per day.   If a food causes distress, stop eating it for a period of time.  FOODS TO AVOID  Coffee, regular or decaffeinated.   Cola beverages, regular or low calorie.   Tea, regular or decaffeinated.   Pepper.   Cocoa.   High fat foods, including meats.   Butter, margarine, hydrogenated oil (trans fats).   Peppermint or spearmint (if you have GERD).   Fruits and vegetables if not tolerated.   Alcohol.   Nicotine (smoking or chewing). This is one of the most potent stimulants to acid production in the gastrointestinal tract.   Any food that seems to aggravate your condition.  If you have questions regarding your diet, ask your caregiver or a registered dietitian. TIPS  Lying flat may make symptoms worse. Keep the head of your bed raised 6 to 9 inches (15 to 23 cm) by using a foam wedge or blocks under the legs of the bed.   Do not lay down until 3 hours after eating a meal.   Daily physical activity may help reduce symptoms.  MAKE SURE YOU:   Understand these instructions.   Will watch your condition.   Will get help right away if you are not doing well or get worse.  Document Released: 04/13/2005 Document Revised: 04/02/2011 Document Reviewed: 02/27/2011 [employer] Patient Information 2012 [employer]. 

## 2011-09-22 NOTE — Progress Notes (Signed)
  Skin Tag Removal Procedure Note  Pre-operative Diagnosis: Classic skin tags (acrochordon)  Post-operative Diagnosis: Classic skin tags (acrochordon)  Locations:lateral neck  Indications: irritated  Anesthesia: Lidocaine 1% without epinephrine without added sodium bicarbonate  Procedure Details  The risks (including bleeding and infection) and benefits of the procedure and Written informed consent obtained. Using sterile iris scissors, multiple skin tags were snipped off at their bases after cleansing with Betadine.  Bleeding was controlled by pressure.   Findings: Pathognomonic benign lesions  not sent for pathological exam.  Condition: Stable  Complications: none.  Plan: 1. Instructed to keep the wounds dry and covered for 24-48h and clean thereafter. 2. Warning signs of infection were reviewed.   3. Recommended that the patient use OTC acetaminophen as needed for pain.  4. Return as needed.  As an aside pt states she was seen in er for epigastric pain and vaginal bleeding and was found to have fibroids.  She has also had nausea.   She has a gyn and GI doctor.  She will call them.  prilosec was given to pt.

## 2011-10-02 ENCOUNTER — Other Ambulatory Visit: Payer: Self-pay

## 2011-10-02 ENCOUNTER — Other Ambulatory Visit: Payer: Self-pay | Admitting: Family Medicine

## 2011-10-02 NOTE — Telephone Encounter (Signed)
Rx sent 

## 2011-12-15 ENCOUNTER — Other Ambulatory Visit (HOSPITAL_COMMUNITY)
Admission: RE | Admit: 2011-12-15 | Discharge: 2011-12-15 | Disposition: A | Discharge status: Home / Self Care | Payer: 59 | Source: Ambulatory Visit | Attending: Obstetrics and Gynecology | Admitting: Obstetrics and Gynecology

## 2011-12-15 DIAGNOSIS — Z1151 Encounter for screening for human papillomavirus (HPV): Secondary | ICD-10-CM | POA: Insufficient documentation

## 2011-12-15 DIAGNOSIS — Z124 Encounter for screening for malignant neoplasm of cervix: Secondary | ICD-10-CM | POA: Insufficient documentation

## 2011-12-27 ENCOUNTER — Encounter (HOSPITAL_BASED_OUTPATIENT_CLINIC_OR_DEPARTMENT_OTHER): Payer: Self-pay | Admitting: Emergency Medicine

## 2011-12-27 ENCOUNTER — Emergency Department (HOSPITAL_BASED_OUTPATIENT_CLINIC_OR_DEPARTMENT_OTHER)
Admission: EM | Admit: 2011-12-27 | Discharge: 2011-12-27 | Disposition: A | Discharge status: Home / Self Care | Payer: 59 | Attending: Emergency Medicine | Admitting: Emergency Medicine

## 2011-12-27 DIAGNOSIS — N390 Urinary tract infection, site not specified: Secondary | ICD-10-CM | POA: Insufficient documentation

## 2011-12-27 DIAGNOSIS — I10 Essential (primary) hypertension: Secondary | ICD-10-CM | POA: Insufficient documentation

## 2011-12-27 LAB — URINALYSIS, ROUTINE W REFLEX MICROSCOPIC
Nitrite: NEGATIVE
Specific Gravity, Urine: 1.026 (ref 1.005–1.030)
Urobilinogen, UA: 1 mg/dL (ref 0.0–1.0)

## 2011-12-27 LAB — PREGNANCY, URINE: Preg Test, Ur: NEGATIVE

## 2011-12-27 LAB — URINE MICROSCOPIC-ADD ON

## 2011-12-27 MED ORDER — CIPROFLOXACIN HCL 500 MG PO TABS
500.0000 mg | ORAL_TABLET | Freq: Once | ORAL | Status: AC
  Administered 2011-12-27: 500 mg via ORAL
  Filled 2011-12-27: qty 1

## 2011-12-27 MED ORDER — CIPROFLOXACIN HCL 500 MG PO TABS: 500.0000 mg | ORAL_TABLET | Freq: Two times a day (BID) | ORAL | Status: AC

## 2011-12-27 NOTE — ED Notes (Signed)
Pt having dysuria for several days.  Started having lower back pain yesterday.  No known fever.

## 2011-12-27 NOTE — ED Provider Notes (Signed)
History     CSN: [id number]  Arrival date & time 12/27/11  1021   First MD Initiated Contact with Patient 12/27/11 1110      Chief Complaint  Patient presents with  . Dysuria    (Consider location/radiation/quality/duration/timing/severity/associated sxs/prior treatment) HPI Comments: Patient presents with a 3 to four-day history of burning on urination and urinary frequency. The last 2 days she's been having some crampy pain in her left lower back area. She denies any nausea or vomiting. Denies any fevers or chills. Denies any vaginal bleeding or discharge. She's had a constant throbbing pain in her lower back the last 2 days. Denies any blood in her urine.  The history is provided by the patient.    Past Medical History  Diagnosis Date  . Hypertension     Past Surgical History  Procedure Date  . Appendectomy     Family History  Problem Relation Age of Onset  . Hypertension Mother   . COPD Mother   . Hypertension Maternal Aunt   . Cancer Maternal Uncle     colon  . Cancer Maternal Uncle     liver    History  Substance Use Topics  . Smoking status: Never Smoker   . Smokeless tobacco: Never Used  . Alcohol Use: 0.6 oz/week    1 Glasses of wine per week    OB History    Grav Para Term Preterm Abortions TAB SAB Ect Mult Living                  Review of Systems  Constitutional: Negative for fever, chills, diaphoresis and fatigue.  HENT: Negative for congestion, rhinorrhea and sneezing.   Eyes: Negative.   Respiratory: Negative for cough, chest tightness and shortness of breath.   Cardiovascular: Negative for chest pain and leg swelling.  Gastrointestinal: Negative for nausea, vomiting, abdominal pain, diarrhea and blood in stool.  Genitourinary: Positive for dysuria and frequency. Negative for hematuria, flank pain and difficulty urinating.  Musculoskeletal: Positive for back pain. Negative for arthralgias.  Skin: Negative for rash.  Neurological: Negative  for dizziness, speech difficulty, weakness, numbness and headaches.    Allergies  Review of patient's allergies indicates no known allergies.  Home Medications   Current Outpatient Rx  Name Route Sig Dispense Refill  . ACETAMINOPHEN 500 MG PO TABS Oral Take 1,000 mg by mouth every 6 (six) hours as needed. Patient used this medication for headache and body aches.    Marland Kitchen CIPROFLOXACIN HCL 500 MG PO TABS Oral Take 1 tablet (500 mg total) by mouth every 12 (twelve) hours. 14 tablet 0  . FERROUS SULFATE 325 (65 FE) MG PO TABS Oral Take 325 mg by mouth daily with breakfast.      . TRIAMTERENE-HCTZ 37.5-25 MG PO TABS  TAKE 1 TABLET BY MOUTH EVERY DAY 30 tablet 4    BP 113/78  Pulse 70  Temp 98.2 F (36.8 C) (Oral)  Resp 16  SpO2 100%  LMP 12/13/2011  Physical Exam  Constitutional: She is oriented to person, place, and time. She appears well-developed and well-nourished.  HENT:  Head: Normocephalic and atraumatic.  Eyes: Pupils are equal, round, and reactive to light.  Neck: Normal range of motion. Neck supple.  Cardiovascular: Normal rate, regular rhythm and normal heart sounds.   Pulmonary/Chest: Effort normal and breath sounds normal. No respiratory distress. She has no wheezes. She has no rales. She exhibits no tenderness.  Abdominal: Soft. Bowel sounds are normal. There is  no tenderness. There is no rebound and no guarding.  Musculoskeletal: Normal range of motion. She exhibits no edema.  Lymphadenopathy:    She has no cervical adenopathy.  Neurological: She is alert and oriented to person, place, and time.  Skin: Skin is warm and dry. No rash noted.  Psychiatric: She has a normal mood and affect.    ED Course  Procedures (including critical care time)  Results for orders placed during the hospital encounter of 12/27/11  PREGNANCY, URINE      Component Value Range   Preg Test, Ur NEGATIVE  NEGATIVE  URINALYSIS, ROUTINE W REFLEX MICROSCOPIC      Component Value Range    Color, Urine YELLOW  YELLOW   APPearance CLOUDY (*) CLEAR   Specific Gravity, Urine 1.026  1.005 - 1.030   pH 5.5  5.0 - 8.0   Glucose, UA NEGATIVE  NEGATIVE mg/dL   Hgb urine dipstick NEGATIVE  NEGATIVE   Bilirubin Urine SMALL (*) NEGATIVE   Ketones, ur NEGATIVE  NEGATIVE mg/dL   Protein, ur NEGATIVE  NEGATIVE mg/dL   Urobilinogen, UA 1.0  0.0 - 1.0 mg/dL   Nitrite NEGATIVE  NEGATIVE   Leukocytes, UA TRACE (*) NEGATIVE  URINE MICROSCOPIC-ADD ON      Component Value Range   Squamous Epithelial / LPF FEW (*) RARE   WBC, UA 0-2  <3 WBC/hpf   Bacteria, UA MANY (*) RARE   Urine-Other MUCOUS PRESENT     No results found.    1. UTI (lower urinary tract infection)       MDM  Patient is well-appearing in no signs of a urinary tract infection. There's no fevers vomiting or other symptoms suggestive of pyelonephritis. There is no hematuria to suggest kidney stone. She has no vaginal symptoms. We'll treat her with Cipro advised her to followup with her primary care physician if no better within the next 23 days return here as needed for any worsening symptoms        Rolan Bucco, MD 12/27/11 1205

## 2012-04-15 ENCOUNTER — Other Ambulatory Visit: Payer: Self-pay | Admitting: Family Medicine

## 2012-04-15 MED ORDER — TRIAMTERENE-HCTZ 37.5-25 MG PO TABS: ORAL_TABLET | ORAL | Status: DC

## 2012-04-15 NOTE — Telephone Encounter (Signed)
refill  Triamterene-HCTZ (Tab) 37.5-25 MG TAKE 1 TABLET BY MOUTH EVERY DAY #30 wt/2-refills last fill 10.28.13--last ov 5.28.13 Skin tag removal--last labs here 10.15.12

## 2012-04-18 ENCOUNTER — Telehealth: Payer: Self-pay | Admitting: Family Medicine

## 2012-04-18 MED ORDER — TRIAMTERENE-HCTZ 37.5-25 MG PO TABS: ORAL_TABLET | ORAL | Status: DC

## 2012-04-18 NOTE — Telephone Encounter (Signed)
Patient calling to inquire about her blood pressure medication states that she put in a request several days ago and that pharmacy has also submitted a request.  Reviewed epic and script for Maxzide submitted to [facility] pharmacy on 04/15/12.  Patient states that she no longer uses [facility] and that she now uses [facility] [phone].    OFFICE NOTE: PLEASE NOTE CHANGE TO PATIENT PHARMACY PREFERENCE.

## 2012-04-18 NOTE — Telephone Encounter (Signed)
Pharmacy updated and Rx resent to correct pharmacy,

## 2012-05-27 ENCOUNTER — Other Ambulatory Visit: Payer: Self-pay | Admitting: Family Medicine

## 2012-06-07 ENCOUNTER — Encounter: Payer: Self-pay | Admitting: Family Medicine

## 2012-06-07 ENCOUNTER — Ambulatory Visit (INDEPENDENT_AMBULATORY_CARE_PROVIDER_SITE_OTHER): Payer: PRIVATE HEALTH INSURANCE | Admitting: Family Medicine

## 2012-06-07 VITALS — BP 100/60 | HR 70 | Temp 98.7°F | Ht 66.5 in | Wt 184.8 lb

## 2012-06-07 DIAGNOSIS — I739 Peripheral vascular disease, unspecified: Secondary | ICD-10-CM

## 2012-06-07 DIAGNOSIS — R609 Edema, unspecified: Secondary | ICD-10-CM

## 2012-06-07 LAB — POCT URINALYSIS DIPSTICK
Blood, UA: NEGATIVE
Protein, UA: NEGATIVE
Spec Grav, UA: 1.01
Urobilinogen, UA: 0.2
pH, UA: 7.5

## 2012-06-07 LAB — HEPATIC FUNCTION PANEL
AST: 18 U/L (ref 0–37)
Total Bilirubin: 0 mg/dL — ABNORMAL LOW (ref 0.3–1.2)

## 2012-06-07 LAB — BASIC METABOLIC PANEL
BUN: 12 mg/dL (ref 6–23)
Calcium: 9.3 mg/dL (ref 8.4–10.5)
Creatinine, Ser: 0.8 mg/dL (ref 0.4–1.2)
GFR: 95.77 mL/min (ref >60.00)
Glucose, Bld: 81 mg/dL (ref 70–99)

## 2012-06-07 LAB — CBC WITH DIFFERENTIAL/PLATELET
Basophils Absolute: 0 10*3/uL (ref 0.0–0.1)
HCT: 39 % (ref 36.0–46.0)
Lymphs Abs: 2 10*3/uL (ref 0.7–4.0)
Monocytes Relative: 10.2 % (ref 3.0–12.0)
Platelets: 282 10*3/uL (ref 150.0–400.0)
RDW: 14 % (ref 11.5–14.6)

## 2012-06-07 LAB — LIPID PANEL
Cholesterol: 160 mg/dL (ref 0–200)
LDL Cholesterol: 91 mg/dL (ref 0–99)
Triglycerides: 101 mg/dL (ref 0.0–149.0)
VLDL: 20.2 mg/dL (ref 0.0–40.0)

## 2012-06-07 MED ORDER — FUROSEMIDE 20 MG PO TABS: 20.0000 mg | ORAL_TABLET | Freq: Every day | ORAL | Status: DC

## 2012-06-07 NOTE — Patient Instructions (Signed)
Edema Edema is an abnormal build-up of fluids in tissues. Because this is partly dependent on gravity (water flows to the lowest place), it is more common in the legs and thighs (lower extremities). It is also common in the looser tissues, like around the eyes. Painless swelling of the feet and ankles is common and increases as a person ages. It may affect both legs and may include the calves or even thighs. When squeezed, the fluid may move out of the affected area and may leave a dent for a few moments. CAUSES   Prolonged standing or sitting in one place for extended periods of time. Movement helps pump tissue fluid into the veins, and absence of movement prevents this, resulting in edema.  Varicose veins. The valves in the veins do not work as well as they should. This causes fluid to leak into the tissues.  Fluid and salt overload.  Injury, burn, or surgery to the leg, ankle, or foot, may damage veins and allow fluid to leak out.  Sunburn damages vessels. Leaky vessels allow fluid to go out into the sunburned tissues.  Allergies (from insect bites or stings, medications or chemicals) cause swelling by allowing vessels to become leaky.  Protein in the blood helps keep fluid in your vessels. Low protein, as in malnutrition, allows fluid to leak out.  Hormonal changes, including pregnancy and menstruation, cause fluid retention. This fluid may leak out of vessels and cause edema.  Medications that cause fluid retention. Examples are sex hormones, blood pressure medications, steroid treatment, or anti-depressants.  Some illnesses cause edema, especially heart failure, kidney disease, or liver disease.  Surgery that cuts veins or lymph nodes, such as surgery done for the heart or for breast cancer, may result in edema. DIAGNOSIS  Your caregiver is usually easily able to determine what is causing your swelling (edema) by simply asking what is wrong (getting a history) and examining you (doing  a physical). Sometimes x-rays, EKG (electrocardiogram or heart tracing), and blood work may be done to evaluate for underlying medical illness. TREATMENT  General treatment includes:  Leg elevation (or elevation of the affected body part).  Restriction of fluid intake.  Prevention of fluid overload.  Compression of the affected body part. Compression with elastic bandages or support stockings squeezes the tissues, preventing fluid from entering and forcing it back into the blood vessels.  Diuretics (also called water pills or fluid pills) pull fluid out of your body in the form of increased urination. These are effective in reducing the swelling, but can have side effects and must be used only under your caregiver's supervision. Diuretics are appropriate only for some types of edema. The specific treatment can be directed at any underlying causes discovered. Heart, liver, or kidney disease should be treated appropriately. HOME CARE INSTRUCTIONS   Elevate the legs (or affected body part) above the level of the heart, while lying down.  Avoid sitting or standing still for prolonged periods of time.  Avoid putting anything directly under the knees when lying down, and do not wear constricting clothing or garters on the upper legs.  Exercising the legs causes the fluid to work back into the veins and lymphatic channels. This may help the swelling go down.  The pressure applied by elastic bandages or support stockings can help reduce ankle swelling.  A low-salt diet may help reduce fluid retention and decrease the ankle swelling.  Take any medications exactly as prescribed. SEEK MEDICAL CARE IF:  Your edema is   not responding to recommended treatments. SEEK IMMEDIATE MEDICAL CARE IF:   You develop shortness of breath or chest pain.  You cannot breathe when you lay down; or if, while lying down, you have to get up and go to the window to get your breath.  You are having increasing  swelling without relief from treatment.  You develop a fever over 102 F (38.9 C).  You develop pain or redness in the areas that are swollen.  Tell your caregiver right away if you have gained 3 lb/1.4 kg in 1 day or 5 lb/2.3 kg in a week. MAKE SURE YOU:   Understand these instructions.  Will watch your condition.  Will get help right away if you are not doing well or get worse. Document Released: 04/13/2005 Document Revised: 10/13/2011 Document Reviewed: 11/30/2007 [employer] Patient Information 2013 [employer].  

## 2012-06-07 NOTE — Progress Notes (Signed)
  Subjective:    Jo Compton is a 47 y.o. female who presents for evaluation of edema in both ankles and feet and both lower legs. The edema has been severe. Onset of symptoms was 2 years ago, and patient reports symptoms have gradually worsened since that time. The edema is present all day. The patient states the problem is long-standing. The swelling has been aggravated by dependency of involved area and walking. The swelling has been relieved by nothing. Associated factors include: nothing.  Pt c/o pain and tingling in feet and legs with walking and it goes away when she rests.  Cardiac risk factors: hypertension.  The following portions of the patient's history were reviewed and updated as appropriate:  She  has a past medical history of Hypertension. She  does not have a problem list on file. She  has past surgical history that includes Appendectomy. Her family history includes COPD in her mother; Cancer in her maternal uncles; and Hypertension in her maternal aunt and mother. She  reports that she has never smoked. She has never used smokeless tobacco. She reports that she drinks about 0.6 ounces of alcohol per week. She reports that she does not use illicit drugs. She has a current medication list which includes the following prescription(s): acetaminophen, ferrous sulfate, and furosemide. Current Outpatient Prescriptions on File Prior to Visit  Medication Sig Dispense Refill  . acetaminophen (TYLENOL) 500 MG tablet Take 1,000 mg by mouth every 6 (six) hours as needed. Patient used this medication for headache and body aches.      . ferrous sulfate 325 (65 FE) MG tablet Take 325 mg by mouth daily with breakfast.         No current facility-administered medications on file prior to visit.   She has No Known Allergies..  Review of Systems Pertinent items are noted in HPI.   Objective:    BP 100/60  Pulse 70  Temp(Src) 98.7 F (37.1 C) (Oral)  Ht 5' 6.5" (1.689 m)  Wt 184 lb 12.8  oz (83.825 kg)  BMI 29.38 kg/m2  SpO2 99% General appearance: alert, cooperative, appears stated age and no distress Lungs: clear to auscultation bilaterally Heart: S1, S2 normal Extremities: edema trace pitting and no calf pain   Cardiographics ECG: not done  Imaging Chest x-ray: not indicated   Assessment:     Edema .    fatigue ? Claudication-- check art dopplers Plan:    Recommendations: decrease sodium in the diet, elevate feet above the level of the heart whenever possible, increase physical activity and use of compression stockings. The patient was also instructed to call IMMEDIATELY (i.e., day or night) if any cardiopulmonary symptoms occur, especially chest pain, shortness of breath, dyspnea on exertion, paroxysmal nocturnal dyspnea, or orthopnea, and these were explained. Follow up in 2 weeks and as needed.

## 2012-06-07 NOTE — Progress Notes (Deleted)
  Subjective:    Patient ID: Jo Compton, female    DOB: [birth year], 47 y.o.   MRN: [id number]  HPI   Review of Systems     Objective:   Physical Exam        Assessment & Plan:

## 2012-06-14 ENCOUNTER — Encounter: Payer: Self-pay | Admitting: Family Medicine

## 2012-06-16 IMAGING — US US EXTREM LOW VENOUS*L*
1 series · 14 of 20 positions shown · non-contrast
Comparison: None

CLINICAL DATA: Left leg pain with muscle spasms and tingling for 3-
4 days question deep venous thrombosis, history hypertension

LEFT LOWER EXTREMITY VENOUS DUPLEX ULTRASOUND
TECHNIQUE: Gray-scale sonography with graded compression, as well
as color Doppler and duplex ultrasound, were performed to evaluate
the deep venous system of the lower extremity from the level of the
common femoral vein through the popliteal and proximal calf veins.
Spectral Doppler was utilized to evaluate flow at rest and with
distal augmentation maneuvers.

[Series 1: us extrem low venous*left* · 14 of 20 slices shown]
[im 1/20]
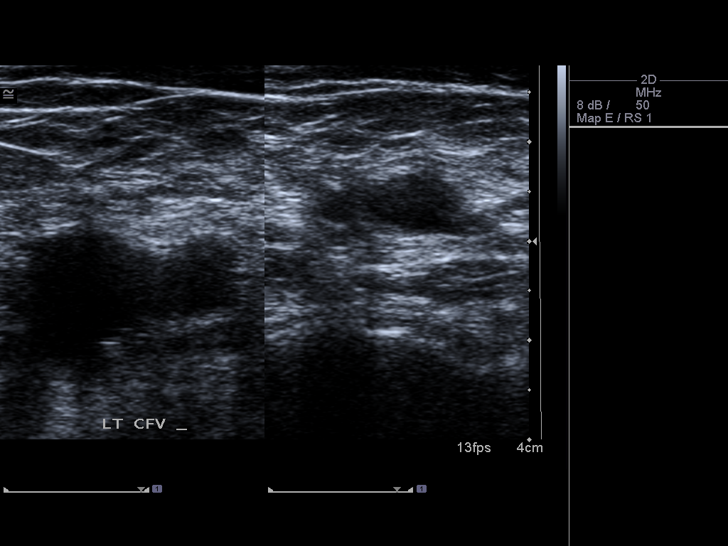
[im 3/20]
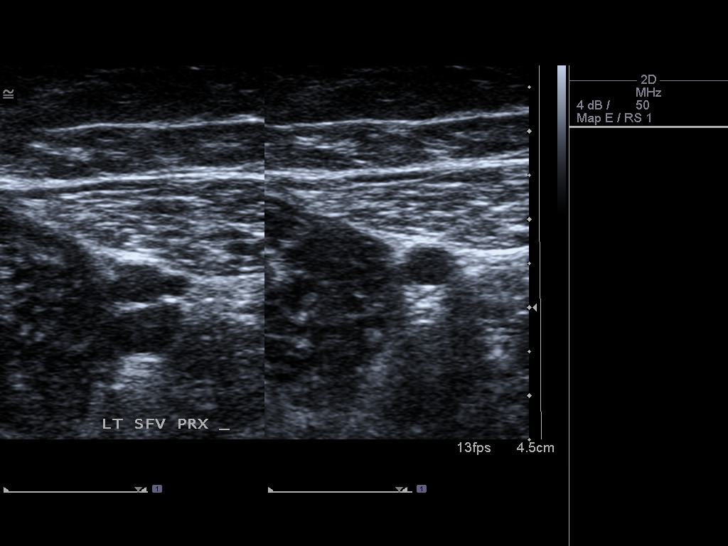
[im 4/20]
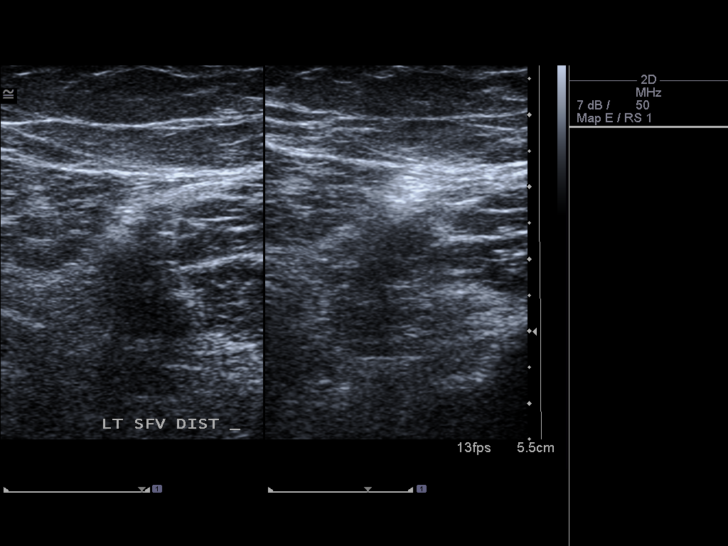
[im 6/20]
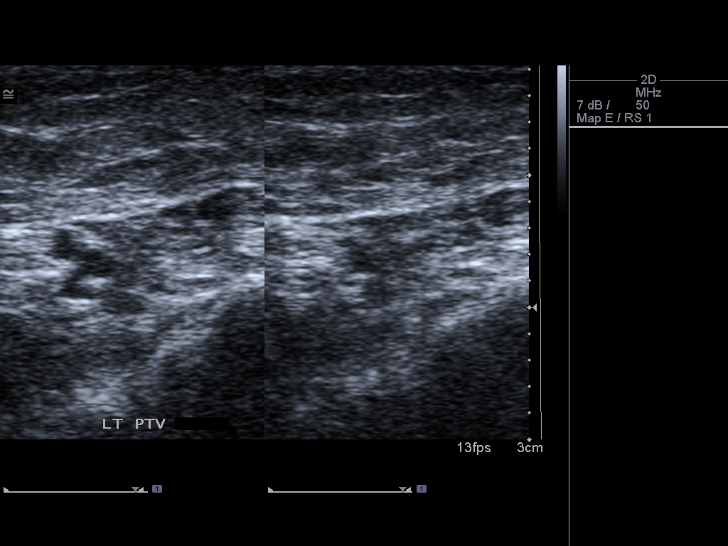
[im 7/20]
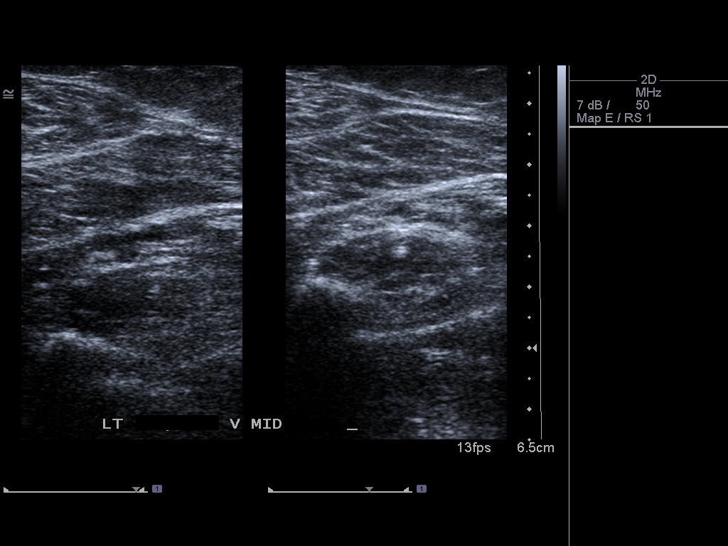
[im 8/20]
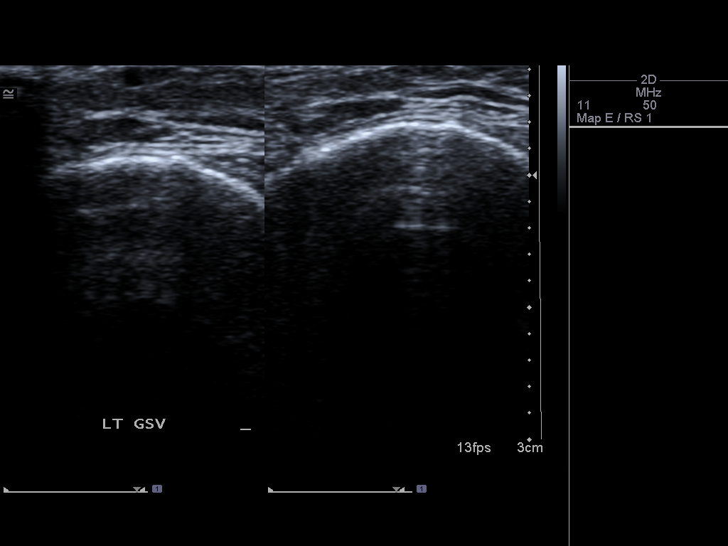
[im 10/20]
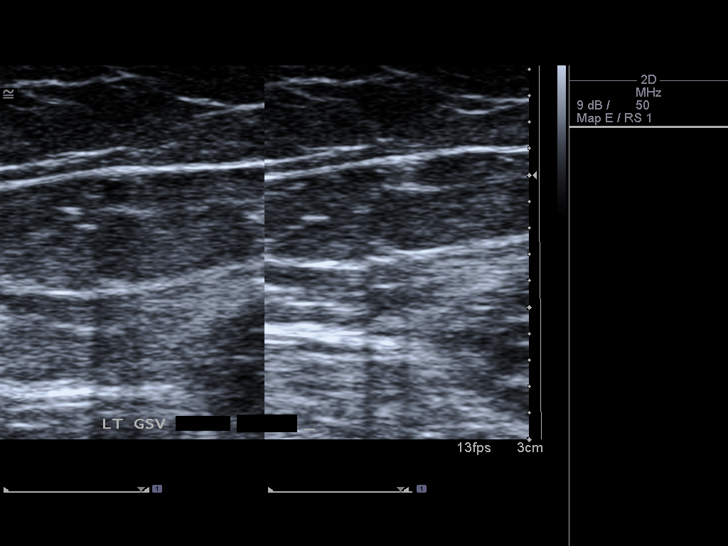
[im 11/20]
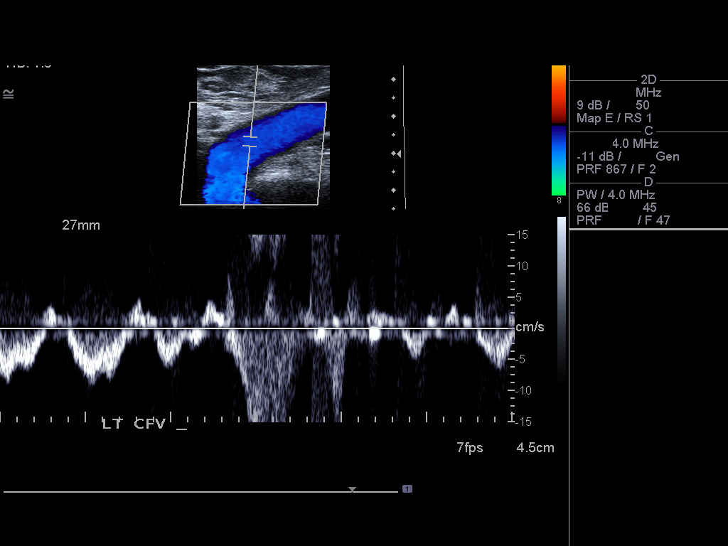
[im 13/20]
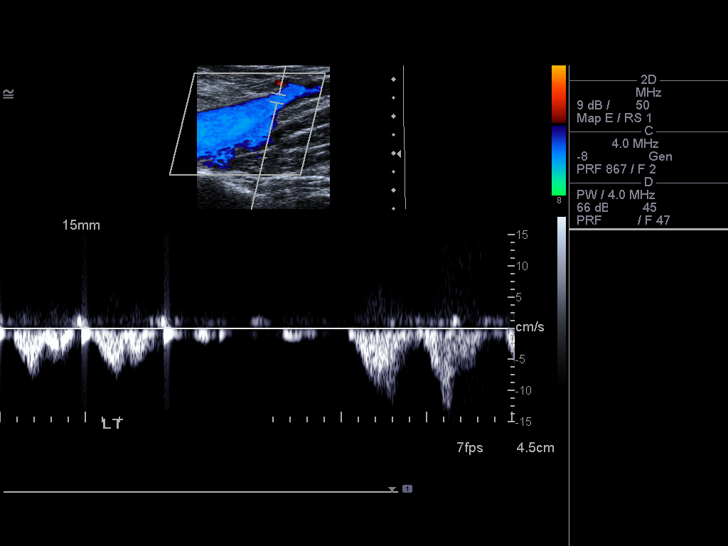
[im 14/20]
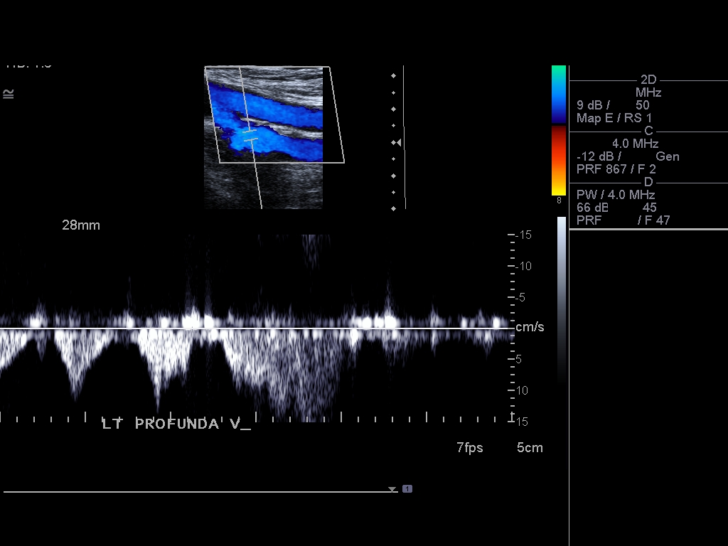
[im 16/20]
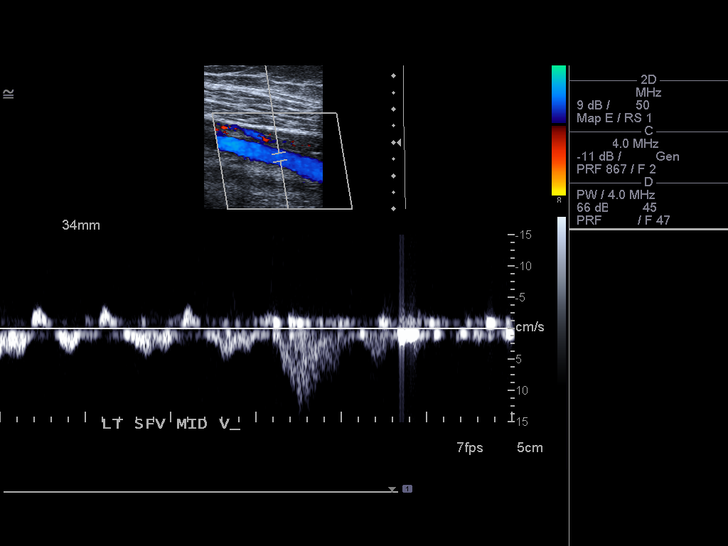
[im 17/20]
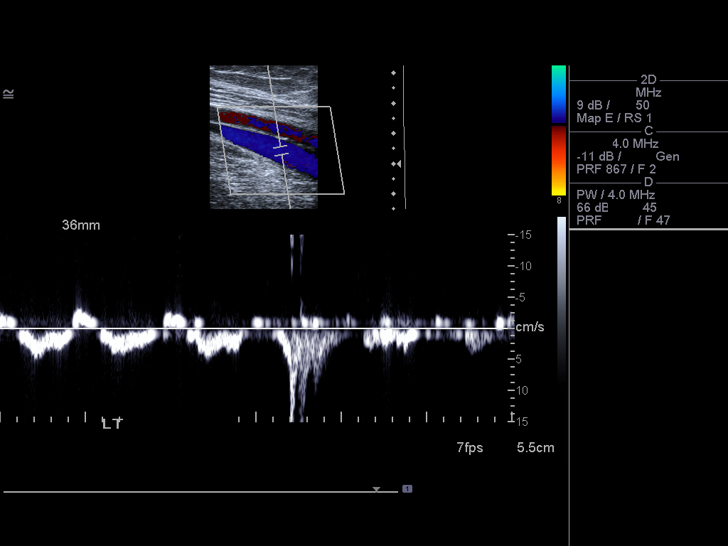
[im 18/20]
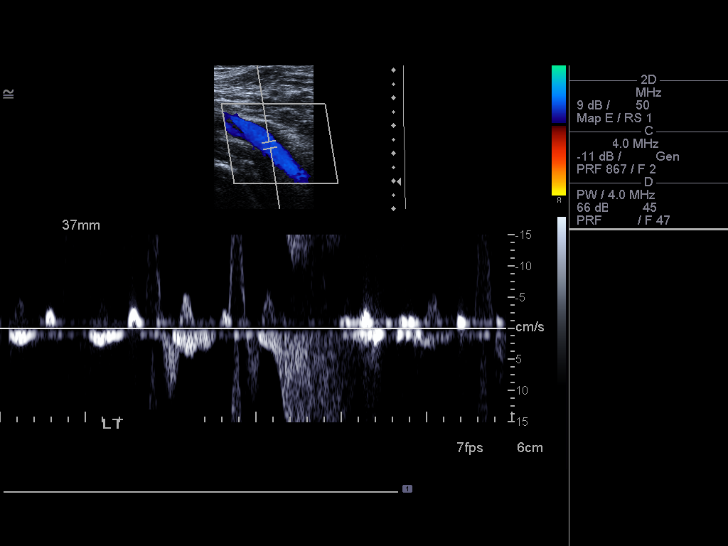
[im 20/20]
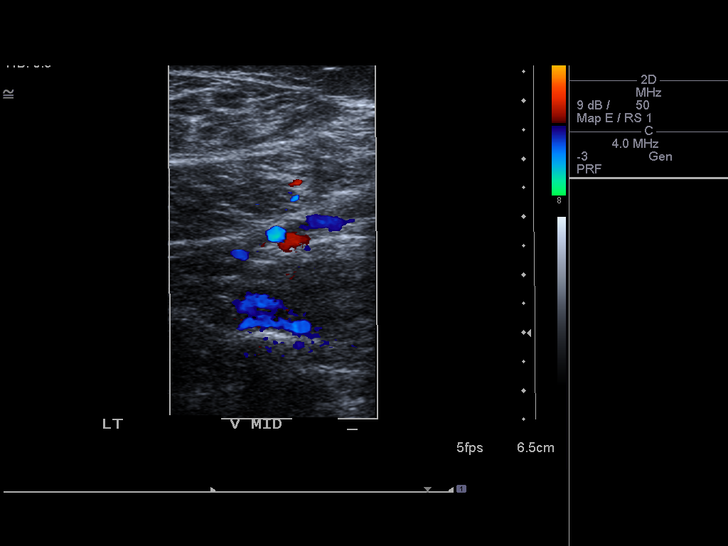

[14 of 20 positions shown; findings below may reference images not displayed]

FINDINGS: Deep venous system patent and compressible from left groin through
popliteal fossa.
Spontaneous venous flow present with intact augmentation and
evidence of respiratory phasicity.
No intraluminal thrombus identified.
Visualized portion of the greater saphenous system unremarkable.
IMPRESSION: No evidence of deep venous thrombosis in left lower extremity.

## 2012-06-17 ENCOUNTER — Other Ambulatory Visit (INDEPENDENT_AMBULATORY_CARE_PROVIDER_SITE_OTHER): Payer: PRIVATE HEALTH INSURANCE

## 2012-06-17 DIAGNOSIS — E0590 Thyrotoxicosis, unspecified without thyrotoxic crisis or storm: Secondary | ICD-10-CM

## 2012-06-17 LAB — T4, FREE: Free T4: 0.97 ng/dL (ref 0.80–1.80)

## 2012-06-17 LAB — TSH: TSH: 0.85 u[IU]/mL (ref 0.350–4.500)

## 2012-06-17 LAB — T3, FREE: T3, Free: 2.4 pg/mL (ref 2.3–4.2)

## 2012-06-17 NOTE — Addendum Note (Signed)
Addended by: Silvio Pate D on: 06/17/2012 03:11 PM   Modules accepted: Orders

## 2012-06-20 DIAGNOSIS — R0989 Other specified symptoms and signs involving the circulatory and respiratory systems: Secondary | ICD-10-CM

## 2012-07-17 ENCOUNTER — Other Ambulatory Visit: Payer: Self-pay | Admitting: Family Medicine

## 2012-08-10 ENCOUNTER — Ambulatory Visit (INDEPENDENT_AMBULATORY_CARE_PROVIDER_SITE_OTHER): Payer: PRIVATE HEALTH INSURANCE | Admitting: Family Medicine

## 2012-08-10 ENCOUNTER — Encounter: Payer: Self-pay | Admitting: Family Medicine

## 2012-08-10 VITALS — BP 120/82 | HR 76 | Temp 98.6°F | Wt 186.2 lb

## 2012-08-10 DIAGNOSIS — M2540 Effusion, unspecified joint: Secondary | ICD-10-CM

## 2012-08-10 DIAGNOSIS — R609 Edema, unspecified: Secondary | ICD-10-CM

## 2012-08-10 DIAGNOSIS — R0602 Shortness of breath: Secondary | ICD-10-CM

## 2012-08-10 DIAGNOSIS — I10 Essential (primary) hypertension: Secondary | ICD-10-CM

## 2012-08-10 DIAGNOSIS — R350 Frequency of micturition: Secondary | ICD-10-CM

## 2012-08-10 LAB — POCT URINALYSIS DIPSTICK
Ketones, UA: NEGATIVE
Nitrite, UA: NEGATIVE
Urobilinogen, UA: 1
pH, UA: 7.5

## 2012-08-10 MED ORDER — CIPROFLOXACIN HCL 250 MG PO TABS: 250.0000 mg | ORAL_TABLET | Freq: Two times a day (BID) | ORAL | Status: DC

## 2012-08-10 MED ORDER — SPIRONOLACTONE 25 MG PO TABS: 25.0000 mg | ORAL_TABLET | Freq: Every day | ORAL | Status: DC

## 2012-08-10 NOTE — Patient Instructions (Addendum)
Edema Edema is an abnormal build-up of fluids in tissues. Because this is partly dependent on gravity (water flows to the lowest place), it is more common in the legs and thighs (lower extremities). It is also common in the looser tissues, like around the eyes. Painless swelling of the feet and ankles is common and increases as a person ages. It may affect both legs and may include the calves or even thighs. When squeezed, the fluid may move out of the affected area and may leave a dent for a few moments. CAUSES   Prolonged standing or sitting in one place for extended periods of time. Movement helps pump tissue fluid into the veins, and absence of movement prevents this, resulting in edema.  Varicose veins. The valves in the veins do not work as well as they should. This causes fluid to leak into the tissues.  Fluid and salt overload.  Injury, burn, or surgery to the leg, ankle, or foot, may damage veins and allow fluid to leak out.  Sunburn damages vessels. Leaky vessels allow fluid to go out into the sunburned tissues.  Allergies (from insect bites or stings, medications or chemicals) cause swelling by allowing vessels to become leaky.  Protein in the blood helps keep fluid in your vessels. Low protein, as in malnutrition, allows fluid to leak out.  Hormonal changes, including pregnancy and menstruation, cause fluid retention. This fluid may leak out of vessels and cause edema.  Medications that cause fluid retention. Examples are sex hormones, blood pressure medications, steroid treatment, or anti-depressants.  Some illnesses cause edema, especially heart failure, kidney disease, or liver disease.  Surgery that cuts veins or lymph nodes, such as surgery done for the heart or for breast cancer, may result in edema. DIAGNOSIS  Your caregiver is usually easily able to determine what is causing your swelling (edema) by simply asking what is wrong (getting a history) and examining you (doing  a physical). Sometimes x-rays, EKG (electrocardiogram or heart tracing), and blood work may be done to evaluate for underlying medical illness. TREATMENT  General treatment includes:  Leg elevation (or elevation of the affected body part).  Restriction of fluid intake.  Prevention of fluid overload.  Compression of the affected body part. Compression with elastic bandages or support stockings squeezes the tissues, preventing fluid from entering and forcing it back into the blood vessels.  Diuretics (also called water pills or fluid pills) pull fluid out of your body in the form of increased urination. These are effective in reducing the swelling, but can have side effects and must be used only under your caregiver's supervision. Diuretics are appropriate only for some types of edema. The specific treatment can be directed at any underlying causes discovered. Heart, liver, or kidney disease should be treated appropriately. HOME CARE INSTRUCTIONS   Elevate the legs (or affected body part) above the level of the heart, while lying down.  Avoid sitting or standing still for prolonged periods of time.  Avoid putting anything directly under the knees when lying down, and do not wear constricting clothing or garters on the upper legs.  Exercising the legs causes the fluid to work back into the veins and lymphatic channels. This may help the swelling go down.  The pressure applied by elastic bandages or support stockings can help reduce ankle swelling.  A low-salt diet may help reduce fluid retention and decrease the ankle swelling.  Take any medications exactly as prescribed. SEEK MEDICAL CARE IF:  Your edema is   not responding to recommended treatments. SEEK IMMEDIATE MEDICAL CARE IF:   You develop shortness of breath or chest pain.  You cannot breathe when you lay down; or if, while lying down, you have to get up and go to the window to get your breath.  You are having increasing  swelling without relief from treatment.  You develop a fever over 102 F (38.9 C).  You develop pain or redness in the areas that are swollen.  Tell your caregiver right away if you have gained 3 lb/1.4 kg in 1 day or 5 lb/2.3 kg in a week. MAKE SURE YOU:   Understand these instructions.  Will watch your condition.  Will get help right away if you are not doing well or get worse. Document Released: 04/13/2005 Document Revised: 10/13/2011 Document Reviewed: 11/30/2007 [employer] Patient Information 2013 [employer].  

## 2012-08-10 NOTE — Progress Notes (Signed)
  Subjective:    Jo Compton is a 47 y.o. female who presents for evaluation of edema in both feet and L >R. The edema has been moderate. Onset of symptoms was several months ago, and patient reports symptoms have gradually worsened since that time. The edema is present all day. The patient states the problem is new. The swelling has been aggravated by dependency of involved area. The swelling has been relieved by nothing. Associated factors include: shortness of breath. Cardiac risk factors: hypertension.  The following portions of the patient's history were reviewed and updated as appropriate: allergies, current medications, past family history, past medical history, past social history, past surgical history and problem list.  Review of Systems Pertinent items are noted in HPI.   Objective:    BP 120/82  Pulse 76  Temp(Src) 98.6 F (37 C) (Oral)  Wt 186 lb 3.2 oz (84.46 kg)  BMI 29.61 kg/m2  SpO2 98% General appearance: alert, cooperative, appears stated age and no distress Throat: lips, mucosa, and tongue normal; teeth and gums normal Neck: no adenopathy, no carotid bruit, no JVD, supple, symmetrical, trachea midline and thyroid not enlarged, symmetric, no tenderness/mass/nodules Lungs: clear to auscultation bilaterally Heart: S1, S2 normal Extremities: edema +1 pitting edema R foot,  tr in L foot   Cardiographics ECG: normal sinus rhythm  Imaging Chest x-ray: not available for review   Assessment:     Edema .    Plan:    Recommendations: decrease sodium in the diet, elevate feet above the level of the heart whenever possible, increase physical activity and use of compression stockings. The patient was also instructed to call IMMEDIATELY (i.e., day or night) if any cardiopulmonary symptoms occur, especially chest pain, shortness of breath, dyspnea on exertion, paroxysmal nocturnal dyspnea, or orthopnea, and these were explained. Follow up in 3 weeks and as needed.

## 2012-08-11 LAB — HEPATIC FUNCTION PANEL
ALT: 30 U/L (ref 0–35)
AST: 31 U/L (ref 0–37)
Alkaline Phosphatase: 45 U/L (ref 39–117)
Total Bilirubin: 0.5 mg/dL (ref 0.3–1.2)

## 2012-08-11 LAB — CBC WITH DIFFERENTIAL/PLATELET
Eosinophils Relative: 0.7 % (ref 0.0–5.0)
HCT: 37.3 % (ref 36.0–46.0)
Hemoglobin: 12.2 g/dL (ref 12.0–15.0)
Lymphocytes Relative: 24.7 % (ref 12.0–46.0)
Lymphs Abs: 1.3 10*3/uL (ref 0.7–4.0)
Monocytes Relative: 4.6 % (ref 3.0–12.0)
Neutro Abs: 3.6 10*3/uL (ref 1.4–7.7)
WBC: 5.2 10*3/uL (ref 4.5–10.5)

## 2012-08-11 LAB — ANA: Anti Nuclear Antibody(ANA): POSITIVE — AB

## 2012-08-11 LAB — VITAMIN B12: Vitamin B-12: 630 pg/mL (ref 211–911)

## 2012-08-11 LAB — BASIC METABOLIC PANEL
GFR: 88.29 mL/min (ref >60.00)
Potassium: 3.6 mEq/L (ref 3.5–5.1)
Sodium: 137 mEq/L (ref 135–145)

## 2012-08-11 LAB — ANTI-NUCLEAR AB-TITER (ANA TITER)

## 2012-08-12 LAB — URINE CULTURE: Colony Count: 5000

## 2012-08-14 LAB — VITAMIN D 1,25 DIHYDROXY: Vitamin D3 1, 25 (OH)2: 54 pg/mL

## 2012-08-15 ENCOUNTER — Other Ambulatory Visit: Payer: Self-pay

## 2012-08-15 DIAGNOSIS — R768 Other specified abnormal immunological findings in serum: Secondary | ICD-10-CM

## 2012-08-15 DIAGNOSIS — M2550 Pain in unspecified joint: Secondary | ICD-10-CM

## 2012-08-16 ENCOUNTER — Encounter: Payer: Self-pay | Admitting: Family Medicine

## 2012-08-17 ENCOUNTER — Ambulatory Visit (HOSPITAL_BASED_OUTPATIENT_CLINIC_OR_DEPARTMENT_OTHER)
Admission: RE | Admit: 2012-08-17 | Discharge: 2012-08-17 | Disposition: A | Discharge status: Home / Self Care | Payer: PRIVATE HEALTH INSURANCE | Source: Ambulatory Visit | Attending: Family Medicine | Admitting: Family Medicine

## 2012-08-17 ENCOUNTER — Other Ambulatory Visit (HOSPITAL_COMMUNITY): Payer: Self-pay | Admitting: Family Medicine

## 2012-08-17 ENCOUNTER — Other Ambulatory Visit (HOSPITAL_COMMUNITY): Payer: PRIVATE HEALTH INSURANCE

## 2012-08-17 DIAGNOSIS — R0602 Shortness of breath: Secondary | ICD-10-CM

## 2012-08-17 DIAGNOSIS — R609 Edema, unspecified: Secondary | ICD-10-CM

## 2012-08-17 DIAGNOSIS — I369 Nonrheumatic tricuspid valve disorder, unspecified: Secondary | ICD-10-CM

## 2012-08-17 DIAGNOSIS — I739 Peripheral vascular disease, unspecified: Secondary | ICD-10-CM

## 2012-08-17 NOTE — Progress Notes (Signed)
Echocardiogram 2D Echocardiogram has been performed.  Jo Compton 08/17/2012, 1:05 PM

## 2012-08-29 ENCOUNTER — Encounter: Payer: Self-pay | Admitting: Cardiology

## 2012-08-29 ENCOUNTER — Encounter (INDEPENDENT_AMBULATORY_CARE_PROVIDER_SITE_OTHER): Payer: PRIVATE HEALTH INSURANCE

## 2012-08-29 DIAGNOSIS — I739 Peripheral vascular disease, unspecified: Secondary | ICD-10-CM

## 2012-08-29 DIAGNOSIS — R52 Pain, unspecified: Secondary | ICD-10-CM

## 2012-09-07 ENCOUNTER — Encounter: Payer: Self-pay | Admitting: General Practice

## 2012-10-17 ENCOUNTER — Telehealth: Payer: Self-pay | Admitting: Family Medicine

## 2012-10-17 NOTE — Telephone Encounter (Signed)
Patient also wanted to know if Dr. Laury Axon knew where she went for last colonoscopy.

## 2012-10-17 NOTE — Telephone Encounter (Signed)
Patient is seeing blood on toilet tissue. Should she come in for OV or should she contact her GI doctor?

## 2012-10-17 NOTE — Telephone Encounter (Signed)
Spoke with patient and I made her aware that she will need to see her GI doctor, she said she has call them and they can not see her until July but she was triaged and awaiting a call back for a sooner apt. She said she has had some issues with constipation and has not seen her GI doctor in a year. Her colonoscopy was done in Feb of 2013 and it was ok although they removed a few polyps. I made her aware to call us back and let us know the status of her appointment and if she can not get into see her GI doctor Dr.Lowne may seen her until she can get to her apt. She voiced understanding and agreed.    KP

## 2012-10-19 ENCOUNTER — Encounter (HOSPITAL_BASED_OUTPATIENT_CLINIC_OR_DEPARTMENT_OTHER): Payer: Self-pay | Admitting: Emergency Medicine

## 2012-10-19 ENCOUNTER — Emergency Department (HOSPITAL_BASED_OUTPATIENT_CLINIC_OR_DEPARTMENT_OTHER)
Admission: EM | Admit: 2012-10-19 | Discharge: 2012-10-20 | Disposition: A | Discharge status: Home / Self Care | Payer: PRIVATE HEALTH INSURANCE | Attending: Emergency Medicine | Admitting: Emergency Medicine

## 2012-10-19 DIAGNOSIS — Z3202 Encounter for pregnancy test, result negative: Secondary | ICD-10-CM | POA: Insufficient documentation

## 2012-10-19 DIAGNOSIS — I10 Essential (primary) hypertension: Secondary | ICD-10-CM | POA: Insufficient documentation

## 2012-10-19 DIAGNOSIS — K602 Anal fissure, unspecified: Secondary | ICD-10-CM | POA: Insufficient documentation

## 2012-10-19 DIAGNOSIS — Z79899 Other long term (current) drug therapy: Secondary | ICD-10-CM | POA: Insufficient documentation

## 2012-10-19 HISTORY — DX: Hemorrhage of anus and rectum: K62.5

## 2012-10-19 NOTE — ED Notes (Signed)
Pt c/o rectal bleeding with BM x 1 week. Pt states she is now having lower back pain. Pt had colonoscopy x 1 year ago due to hx of rectal bleeding.

## 2012-10-19 NOTE — ED Notes (Signed)
Pt reports bright red blood after bm, has history of hemorrhoids, feeling nauseated at times no vomiting and back pain that radiates to right buttocks, no history of injury

## 2012-10-19 NOTE — ED Notes (Signed)
Pt called GI MD who called her in suppository that she started taking yesterday.

## 2012-10-19 NOTE — ED Provider Notes (Signed)
History    CSN: [id number] Arrival date & time 10/19/12  2103  First MD Initiated Contact with Patient 10/19/12 2329     Chief Complaint  Patient presents with  . Rectal Bleeding   (Consider location/radiation/quality/duration/timing/severity/associated sxs/prior Treatment) HPI This is a 47 year old female with one-week history of rectal bleeding. The bleeding occurs after defecation and is described as blood on the paper. She is chronically constipated and only moves her bowels about once a week. Her husband states she strains at stool. There is also some bleeding into the toilet bowl and on her stools. She is not sure if the blood is coming from the outside or higher. There is no pain associated with bowel movements. She recently had a colonoscopy and was placed on a stool softener yesterday. Today she is having low back pain. She denies dysuria but does on her menses.  Past Medical History  Diagnosis Date  . Hypertension   . Rectal bleeding    Past Surgical History  Procedure Laterality Date  . Appendectomy    . Colonoscopy     Family History  Problem Relation Age of Onset  . Hypertension Mother   . COPD Mother   . Hypertension Maternal Aunt   . Cancer Maternal Uncle     colon  . Cancer Maternal Uncle     liver   History  Substance Use Topics  . Smoking status: Never Smoker   . Smokeless tobacco: Never Used  . Alcohol Use: 0.6 oz/week    1 Glasses of wine per week   OB History   Grav Para Term Preterm Abortions TAB SAB Ect Mult Living                 Review of Systems  All other systems reviewed and are negative.    Allergies  Review of patient's allergies indicates no known allergies.  Home Medications   Current Outpatient Rx  Name  Route  Sig  Dispense  Refill  . ferrous sulfate 325 (65 FE) MG tablet   Oral   Take 325 mg by mouth daily with breakfast.           . hydrocortisone (ANUSOL-HC) 25 MG suppository   Rectal   Place 25 mg rectally 2  (two) times daily.         . metroNIDAZOLE (FLAGYL) 500 MG tablet   Oral   Take 500 mg by mouth 3 (three) times daily.         Marland Kitchen spironolactone (ALDACTONE) 25 MG tablet   Oral   Take 1 tablet (25 mg total) by mouth daily.   30 tablet   2   . ciprofloxacin (CIPRO) 250 MG tablet   Oral   Take 1 tablet (250 mg total) by mouth 2 (two) times daily.   6 tablet   0    BP 125/79  Pulse 74  Temp(Src) 97.5 F (36.4 C) (Oral)  Resp 18  Ht 5\' 7"  (1.702 m)  Wt 185 lb (83.915 kg)  BMI 28.97 kg/m2  SpO2 100%  Physical Exam General: Well-developed, well-nourished female in no acute distress; appearance consistent with age of record HENT: normocephalic, atraumatic Eyes: pupils equal round and reactive to light; extraocular muscles intact Neck: supple Heart: regular rate and rhythm Lungs: clear to auscultation bilaterally Abdomen: soft; nondistended; nontender; no masses or hepatosplenomegaly; bowel sounds present Rectal: External hemorrhoids without thrombosis or inflammation; posterior midline fissure with slight bleeding; normal rectal tone; no stool or gross blood  in rectal vault Extremities: No deformity; full range of motion; pulses normal Neurologic: Awake, alert and oriented; motor function intact in all extremities and symmetric; no facial droop Skin: Warm and dry Psychiatric: Normal mood and affect    ED Course  Procedures (including critical care time)    MDM   Nursing notes and vitals signs, including pulse oximetry, reviewed.  Summary of this visit's results, reviewed by myself:  Labs:  Results for orders placed during the hospital encounter of 10/19/12 (from the past 24 hour(s))  OCCULT BLOOD X 1 CARD TO LAB, STOOL     Status: Abnormal   Collection Time    10/19/12 11:48 PM      Result Value Range   Fecal Occult Bld POSITIVE (*) NEGATIVE  CBC WITH DIFFERENTIAL     Status: None   Collection Time    10/20/12 12:10 AM      Result Value Range   WBC 6.3   4.0 - 10.5 K/uL   RBC 4.76  3.87 - 5.11 MIL/uL   Hemoglobin 12.4  12.0 - 15.0 g/dL   HCT 40.9  81.1 - 91.4 %   MCV 78.6  78.0 - 100.0 fL   MCH 26.1  26.0 - 34.0 pg   MCHC 33.2  30.0 - 36.0 g/dL   RDW 78.2  95.6 - 21.3 %   Platelets 272  150 - 400 K/uL   Neutrophils Relative % 49  43 - 77 %   Neutro Abs 3.1  1.7 - 7.7 K/uL   Lymphocytes Relative 40  12 - 46 %   Lymphs Abs 2.6  0.7 - 4.0 K/uL   Monocytes Relative 9  3 - 12 %   Monocytes Absolute 0.6  0.1 - 1.0 K/uL   Eosinophils Relative 2  0 - 5 %   Eosinophils Absolute 0.1  0.0 - 0.7 K/uL   Basophils Relative 0  0 - 1 %   Basophils Absolute 0.0  0.0 - 0.1 K/uL  BASIC METABOLIC PANEL     Status: Abnormal   Collection Time    10/20/12 12:10 AM      Result Value Range   Sodium 139  135 - 145 mEq/L   Potassium 4.2  3.5 - 5.1 mEq/L   Chloride 104  96 - 112 mEq/L   CO2 24  19 - 32 mEq/L   Glucose, Bld 96  70 - 99 mg/dL   BUN 11  6 - 23 mg/dL   Creatinine, Ser 0.86  0.50 - 1.10 mg/dL   Calcium 9.8  8.4 - 57.8 mg/dL   GFR calc non Af Amer 88 (*) >90 mL/min   GFR calc Af Amer >90  >90 mL/min  URINALYSIS, ROUTINE W REFLEX MICROSCOPIC     Status: None   Collection Time    10/20/12 12:13 AM      Result Value Range   Color, Urine YELLOW  YELLOW   APPearance CLEAR  CLEAR   Specific Gravity, Urine 1.023  1.005 - 1.030   pH 7.0  5.0 - 8.0   Glucose, UA NEGATIVE  NEGATIVE mg/dL   Hgb urine dipstick NEGATIVE  NEGATIVE   Bilirubin Urine NEGATIVE  NEGATIVE   Ketones, ur NEGATIVE  NEGATIVE mg/dL   Protein, ur NEGATIVE  NEGATIVE mg/dL   Urobilinogen, UA 1.0  0.0 - 1.0 mg/dL   Nitrite NEGATIVE  NEGATIVE   Leukocytes, UA NEGATIVE  NEGATIVE  PREGNANCY, URINE     Status: None   Collection  Time    10/20/12 12:13 AM      Result Value Range   Preg Test, Ur NEGATIVE  NEGATIVE   1:03 AM The patient's bleeding is likely due to the observed fissure and we will treat accordingly. She has a followup appointment with her primary care physician  tomorrow.   Hanley Seamen, MD 10/20/12 [id number]

## 2012-10-20 LAB — CBC WITH DIFFERENTIAL/PLATELET
Basophils Absolute: 0 10*3/uL (ref 0.0–0.1)
Basophils Relative: 0 % (ref 0–1)
Eosinophils Absolute: 0.1 10*3/uL (ref 0.0–0.7)
Hemoglobin: 12.4 g/dL (ref 12.0–15.0)
MCHC: 33.2 g/dL (ref 30.0–36.0)
Neutro Abs: 3.1 10*3/uL (ref 1.7–7.7)
Neutrophils Relative %: 49 % (ref 43–77)
Platelets: 272 10*3/uL (ref 150–400)
RDW: 15.3 % (ref 11.5–15.5)

## 2012-10-20 LAB — BASIC METABOLIC PANEL
Chloride: 104 mEq/L (ref 96–112)
GFR calc Af Amer: >90 mL/min (ref >90)
GFR calc non Af Amer: 88 mL/min — ABNORMAL LOW (ref >90)
Potassium: 4.2 mEq/L (ref 3.5–5.1)
Sodium: 139 mEq/L (ref 135–145)

## 2012-10-20 LAB — URINALYSIS, ROUTINE W REFLEX MICROSCOPIC
Leukocytes, UA: NEGATIVE
Nitrite: NEGATIVE
Specific Gravity, Urine: 1.023 (ref 1.005–1.030)
Urobilinogen, UA: 1 mg/dL (ref 0.0–1.0)
pH: 7 (ref 5.0–8.0)

## 2012-10-20 LAB — OCCULT BLOOD X 1 CARD TO LAB, STOOL: Fecal Occult Bld: POSITIVE — AB

## 2012-10-20 MED ORDER — LIDOCAINE (ANORECTAL) 5 % EX CREA: TOPICAL_CREAM | CUTANEOUS | Status: DC

## 2012-10-21 ENCOUNTER — Ambulatory Visit (INDEPENDENT_AMBULATORY_CARE_PROVIDER_SITE_OTHER): Payer: PRIVATE HEALTH INSURANCE | Admitting: Family Medicine

## 2012-10-21 ENCOUNTER — Encounter: Payer: Self-pay | Admitting: Family Medicine

## 2012-10-21 ENCOUNTER — Other Ambulatory Visit: Payer: Self-pay | Admitting: *Deleted

## 2012-10-21 VITALS — BP 120/70 | HR 80 | Temp 98.8°F | Wt 185.0 lb

## 2012-10-21 DIAGNOSIS — K219 Gastro-esophageal reflux disease without esophagitis: Secondary | ICD-10-CM

## 2012-10-21 DIAGNOSIS — R195 Other fecal abnormalities: Secondary | ICD-10-CM

## 2012-10-21 MED ORDER — OMEPRAZOLE 40 MG PO CPDR: 40.0000 mg | DELAYED_RELEASE_CAPSULE | Freq: Every day | ORAL | Status: DC

## 2012-10-21 NOTE — Telephone Encounter (Signed)
Spoke with pharmacist to inquire if lidocaine cream came in lower dose, she advised the 2.5% was available OTC and not covered by insurance. The lowest strength available by prescription was the one already ordered by Dr. Laury Axon and already picked up by the patient.

## 2012-10-22 ENCOUNTER — Encounter: Payer: Self-pay | Admitting: Family Medicine

## 2012-10-22 NOTE — Progress Notes (Signed)
  Subjective:    Jo Compton is a 47 y.o. female here for evaluation of blood in stool. Patient has associated symptoms of constipation and visible blood: water in bowl turns pinkish. The patient denies abdominal pain, diarrhea and loose stools. The patient has a known history of: anal fissure. The patient has had several episodes of rectal bleeding. There is not a history of rectal injury. Patient has had similar episodes of rectal bleeding in the past.  The following portions of the patient's history were reviewed and updated as appropriate: allergies, current medications, past family history, past medical history, past social history, past surgical history and problem list.  Review of Systems Pertinent items are noted in HPI.    Objective:    BP 120/70  Pulse 80  Temp(Src) 98.8 F (37.1 C) (Oral)  Wt 185 lb (83.915 kg)  BMI 28.97 kg/m2  SpO2 98% General appearance: alert, cooperative, appears stated age and no distress Lungs: clear to auscultation bilaterally Heart: S1, S2 normal Abdomen: soft, non-tender; bowel sounds normal; no masses,  no organomegaly Rectal-heme+ brown stool      Assessment:    Rectal bleeding, likely secondary to probably anal fissure.    Plan:    1. f/u GI 2. Refill prilosec. Follow up as needed.

## 2012-11-14 ENCOUNTER — Other Ambulatory Visit: Payer: Self-pay | Admitting: Family Medicine

## 2012-12-16 ENCOUNTER — Encounter: Payer: Self-pay | Admitting: Family Medicine

## 2012-12-16 ENCOUNTER — Ambulatory Visit (INDEPENDENT_AMBULATORY_CARE_PROVIDER_SITE_OTHER): Payer: PRIVATE HEALTH INSURANCE | Admitting: Family Medicine

## 2012-12-16 VITALS — BP 118/74 | HR 80 | Temp 98.6°F | Wt 195.0 lb

## 2012-12-16 DIAGNOSIS — R195 Other fecal abnormalities: Secondary | ICD-10-CM

## 2012-12-16 DIAGNOSIS — R5381 Other malaise: Secondary | ICD-10-CM

## 2012-12-16 DIAGNOSIS — R350 Frequency of micturition: Secondary | ICD-10-CM

## 2012-12-16 LAB — BASIC METABOLIC PANEL
BUN: 11 mg/dL (ref 6–23)
CO2: 24 mEq/L (ref 19–32)
Chloride: 105 mEq/L (ref 96–112)
Glucose, Bld: 84 mg/dL (ref 70–99)
Potassium: 4.2 mEq/L (ref 3.5–5.3)
Sodium: 135 mEq/L (ref 135–145)

## 2012-12-16 LAB — CBC WITH DIFFERENTIAL/PLATELET
Basophils Relative: 0 % (ref 0–1)
HCT: 37.2 % (ref 36.0–46.0)
Hemoglobin: 12 g/dL (ref 12.0–15.0)
Lymphs Abs: 1.8 10*3/uL (ref 0.7–4.0)
MCHC: 32.3 g/dL (ref 30.0–36.0)
Monocytes Absolute: 0.5 10*3/uL (ref 0.1–1.0)
Monocytes Relative: 9 % (ref 3–12)
Neutro Abs: 3.4 10*3/uL (ref 1.7–7.7)
Neutrophils Relative %: 59 % (ref 43–77)
RBC: 4.77 MIL/uL (ref 3.87–5.11)

## 2012-12-16 LAB — POCT URINALYSIS DIPSTICK
Bilirubin, UA: NEGATIVE
Blood, UA: NEGATIVE
Nitrite, UA: NEGATIVE
Protein, UA: NEGATIVE
pH, UA: 7.5

## 2012-12-16 LAB — HEPATIC FUNCTION PANEL
ALT: 15 U/L (ref 0–35)
AST: 16 U/L (ref 0–37)
Albumin: 3.6 g/dL (ref 3.5–5.2)
Alkaline Phosphatase: 45 U/L (ref 39–117)
Total Protein: 6.1 g/dL (ref 6.0–8.3)

## 2012-12-16 MED ORDER — SPIRONOLACTONE 25 MG PO TABS: ORAL_TABLET | ORAL | Status: DC

## 2012-12-16 NOTE — Patient Instructions (Addendum)

## 2012-12-17 DIAGNOSIS — E669 Obesity, unspecified: Secondary | ICD-10-CM | POA: Insufficient documentation

## 2012-12-17 LAB — TSH: TSH: 0.713 u[IU]/mL (ref 0.350–4.500)

## 2012-12-17 NOTE — Progress Notes (Signed)
  Subjective:     Jo Compton is a 47 y.o. female who presents for evaluation of fatigue. Symptoms began several weeks ago. The patient feels the fatigue began with: excessive menstrual bleeding. Symptoms of her fatigue have been general malaise. Patient describes the following psychological symptoms: none. Patient denies change in hair texture, cold intolerance, constipation, exercise intolerance, fever, GI blood loss, significant change in weight, symptoms of arthritis, unusual rashes and witnessed or suspected sleep apnea. Symptoms have gradually worsened. Symptom severity: symptoms bothersome, but easily able to carry out all usual work/school/family activities. Previous visits for this problem: none. -- here but she has been seen by gyn and was dx with fibroids.  She has appointment with them next week to discuss next step.  She does also have urinary frequency.  No dysuria.  The following portions of the patient's history were reviewed and updated as appropriate: allergies, current medications, past family history, past medical history, past social history, past surgical history and problem list.  Review of Systems Pertinent items are noted in HPI.    Objective:    BP 118/74  Pulse 80  Temp(Src) 98.6 F (37 C) (Oral)  Wt 195 lb (88.451 kg)  BMI 30.53 kg/m2  SpO2 98% General appearance: alert, cooperative, appears stated age and no distress Eyes: conjunctiva is pale Ears: normal TM's and external ear canals both ears Nose: Nares normal. Septum midline. Mucosa normal. No drainage or sinus tenderness. Throat: lips, mucosa, and tongue normal; teeth and gums normal Neck: no adenopathy, no carotid bruit, no JVD, supple, symmetrical, trachea midline and thyroid not enlarged, symmetric, no tenderness/mass/nodules Lungs: clear to auscultation bilaterally Heart: S1, S2 normal Extremities: extremities normal, atraumatic, no cyanosis or edema    Assessment:    Fatigue, organic cause  likely.  Differential diagnoses includes: anemia due to fibroids?.    Plan:    Discussed diagnosis with patient. See orders for lab evaluation. f/u gyn rto here prn

## 2012-12-20 ENCOUNTER — Telehealth: Payer: Self-pay | Admitting: Family Medicine

## 2012-12-20 ENCOUNTER — Encounter: Payer: Self-pay | Admitting: Gastroenterology

## 2012-12-20 LAB — URINE CULTURE

## 2012-12-20 NOTE — Telephone Encounter (Signed)
12/20/12  Achol called to get test results from blood work and UA.  Please call.

## 2012-12-21 NOTE — Telephone Encounter (Signed)
Patient aware labs are WNL.     KP 

## 2013-01-09 ENCOUNTER — Encounter: Payer: Self-pay | Admitting: Family Medicine

## 2013-01-09 ENCOUNTER — Ambulatory Visit (INDEPENDENT_AMBULATORY_CARE_PROVIDER_SITE_OTHER): Payer: PRIVATE HEALTH INSURANCE | Admitting: Family Medicine

## 2013-01-09 VITALS — BP 108/74 | HR 79 | Temp 98.3°F | Wt 196.8 lb

## 2013-01-09 DIAGNOSIS — R350 Frequency of micturition: Secondary | ICD-10-CM

## 2013-01-09 LAB — POCT URINALYSIS DIPSTICK
Blood, UA: NEGATIVE
Glucose, UA: NEGATIVE
Nitrite, UA: NEGATIVE
Spec Grav, UA: 1.01
Urobilinogen, UA: 0.2
pH, UA: 7

## 2013-01-09 NOTE — Patient Instructions (Signed)
Urinary Frequency °The number of times a normal person urinates depends upon how much liquid they take in and how much liquid they are losing. If the temperature is hot and there is high humidity then the person will sweat more and usually breathe a little more frequently. These factors decrease the amount of frequency of urination that would be considered normal. °The amount you drink is easily determined, but the amount of fluid lost is sometimes more difficult to calculate.  °Fluid is lost in two ways: °· Sensible fluid loss is usually measured by the amount of urine that you get rid of. Losses of fluid can also occur with diarrhea. °· Insensible fluid loss is more difficult to measure. It is caused by evaporation. Insensible loss of fluid occurs through breathing and sweating. It usually ranges from a little less than a quart to a little more than a quart of fluid a day. °In normal temperatures and activity levels the average person may urinate 4 to 7 times in a 24-hour period. Needing to urinate more often than that could indicate a problem. If one urinates 4 to 7 times in 24 hours and has large volumes each time, that could indicate a different problem from one who urinates 4 to 7 times a day and has small volumes. The time of urinating is also an important. Most urinating should be done during the waking hours. Getting up at night to urinate frequently can indicate some problems. °CAUSES  °The bladder is the organ in your lower abdomen that holds urine. Like a balloon, it swells some as it fills up. Your nerves sense this and tell you it is time to head for the bathroom. There are a number of reasons that you might feel the need to urinate more often than usual. They include: °· Urinary tract infection. This is usually associated with other signs such as burning when you urinate. °· In men, problems with the prostate (a walnut-size gland that is located near the tube that carries urine out of your body).  There are two reasons why the prostate can cause an increased frequency of urination: °· An enlarged prostate that does not let the bladder empty well. If the bladder only half empties when you urinate then it only has half the capacity to fill before you have to urinate again. °· The nerves in the bladder become more hypersensitive with an increased size of the prostate even if the bladder empties completely. °· Pregnancy. °· Obesity. Excess weight is more likely to cause a problem for women more than for men. °· Bladder stones or other bladder problems. °· Caffeine. °· Alcohol. °· Medications. For example, drugs that help the body get rid of extra fluid (diuretics) increase urine production. Some other medicines must be taken with lots of fluids. °· Muscle or nerve weakness. This might be the result of a spinal cord injury, a stroke, multiple sclerosis or Parkinson's disease. °· Long-standing diabetes can decrease the sensation of the bladder. This loss of sensation makes it harder to sense the bladder needs to be emptied. Over a period of years the bladder is stretched out by constant overfilling. This weakens the bladder muscles so that the bladder does not empty well and has less capacity to fill with new urine. °· Interstitial cystitis (also called painful bladder syndrome). This condition develops because the tissues that line the insider of the bladder are inflamed (inflammation is the body's way of reacting to injury or infection). It causes pain   and frequent urination. It occurs in women more often than in men. °DIAGNOSIS  °· To decide what might be causing your urinary frequency, your healthcare provider will probably: °· Ask about symptoms you have noticed. °· Ask about your overall health. This will include questions about any medications you are taking. °· Do a physical examination. °· Order some tests. These might include: °· A blood test to check for diabetes or other health issues that could be  contributing to the problem. °· Urine testing. This could measure the flow of urine and the pressure on the bladder. °· A test of your neurological system (the brain, spinal cord and nerves). This is the system that senses the need to urinate. °· A bladder test to check whether it is emptying completely when you urinate. °· Cytoscopy. This test uses a thin tube with a tiny camera on it. It offers a look inside your urethra and bladder to see if there are problems. °· Imaging tests. You might be given a contrast dye and then asked to urinate. X-rays are taken to see how your bladder is working. °TREATMENT  °It is important for you to be evaluated to determine if the amount or frequency that you have is unusual or abnormal. If it is found to be abnormal the cause should be determined and this can usually be found out easily. Depending upon the cause treatment could include medication, stimulation of the nerves, or surgery. °There are not too many things that you can do as an individual to change your urinary frequency. It is important that you balance the amount of fluid intake needed to compensate for your activity and the temperature. Medical problems will be diagnosed and taken care of by your physician. There is no particular bladder training such as Kegel's exercises that you can do to help urinary frequency. This is an exercise this is usually done for people who have leaking of urine when they laugh cough or sneeze. °HOME CARE INSTRUCTIONS  °· Take any medications your healthcare provider prescribed or suggested. Follow the directions carefully. °· Practice any lifestyle changes that are recommended. These might include: °· Drinking less fluid or drinking at different times of the day. If you need to urinate often during the night, for example, you may need to stop drinking fluids early in the evening. °· Cutting down on caffeine or alcohol. They both can make you need to urinate more often than normal.  Caffeine is found in coffee, tea and sodas. °· Losing weight, if that is recommended. °· Keep a journal or a log. You might be asked to record how much you drink and when and when you feel the need to urinate. This will also help evaluate how well the treatment provided by your physician is working. °SEEK MEDICAL CARE IF:  °· Your need to urinate often gets worse. °· You feel increased pain or irritation when you urinate. °· You notice blood in your urine. °· You have questions about any medications that your healthcare provider recommended. °· You notice blood, pus or swelling at the site of any test or treatment procedure. °· You develop a fever of more than 100.5° F (38.1° C). °SEEK IMMEDIATE MEDICAL CARE IF:  °You develop a fever of more than 102.0° F (38.9° C). °Document Released: 02/07/2009 Document Revised: 07/06/2011 Document Reviewed: 02/07/2009 °Exit[employer]® Patient Information ©2014 [employer]. ° °

## 2013-01-09 NOTE — Progress Notes (Signed)
  Subjective:    Jo Compton is a 47 y.o. female who complains of frequency and urgency. She has had symptoms for since last ov. Patient also complains of nothing else. Patient denies back pain, congestion, cough, fever, headache, rhinitis, sorethroat, stomach ache and vaginal discharge. Patient does not have a history of recurrent UTI. Patient does not have a history of pyelonephritis.   The following portions of the patient's history were reviewed and updated as appropriate: allergies, current medications, past family history, past medical history, past social history, past surgical history and problem list.  Review of Systems Pertinent items are noted in HPI.    Objective:    BP 108/74  Pulse 79  Temp(Src) 98.3 F (36.8 C) (Oral)  Wt 196 lb 12.8 oz (89.268 kg)  BMI 30.82 kg/m2  SpO2 98% General appearance: alert, cooperative and no distress Abdomen: soft, non-tender; bowel sounds normal; no masses,  no organomegaly  Laboratory:  Urine dipstick: trace for leukocyte esterase.   Micro exam: not done.    Assessment:    urinary frequency     Plan:    Medications: not indicated at this time. Maintain adequate hydration. Follow up if symptoms not improving, and as needed.  Check labs Consider urology referral

## 2013-01-10 ENCOUNTER — Telehealth: Payer: Self-pay | Admitting: Family Medicine

## 2013-01-10 LAB — CBC WITH DIFFERENTIAL/PLATELET
Basophils Absolute: 0 10*3/uL (ref 0.0–0.1)
Basophils Relative: 0.3 % (ref 0.0–3.0)
Eosinophils Absolute: 0.1 10*3/uL (ref 0.0–0.7)
Hemoglobin: 11.7 g/dL — ABNORMAL LOW (ref 12.0–15.0)
MCHC: 32.7 g/dL (ref 30.0–36.0)
MCV: 79.2 fl (ref 78.0–100.0)
Monocytes Absolute: 0.4 10*3/uL (ref 0.1–1.0)
Neutro Abs: 3.2 10*3/uL (ref 1.4–7.7)
Neutrophils Relative %: 61.5 % (ref 43.0–77.0)
RBC: 4.5 Mil/uL (ref 3.87–5.11)
RDW: 15.1 % — ABNORMAL HIGH (ref 11.5–14.6)

## 2013-01-10 LAB — BASIC METABOLIC PANEL
CO2: 26 mEq/L (ref 19–32)
Chloride: 108 mEq/L (ref 96–112)
Creatinine, Ser: 0.7 mg/dL (ref 0.4–1.2)
Glucose, Bld: 87 mg/dL (ref 70–99)

## 2013-01-10 LAB — HEPATIC FUNCTION PANEL
ALT: 15 U/L (ref 0–35)
Total Protein: 6.2 g/dL (ref 6.0–8.3)

## 2013-01-10 NOTE — Telephone Encounter (Signed)
Patient is calling to request blood work results. Please advise.

## 2013-01-11 LAB — URINE CULTURE: Organism ID, Bacteria: NO GROWTH

## 2013-01-11 NOTE — Telephone Encounter (Signed)
Notes Recorded by Lelon Perla, DO on 01/11/2013 at 9:51 AM normal Notes Recorded by Lelon Perla, DO on 01/10/2013 at 2:05 PM Is she taking the iron? + anemia If yes . Inc to 2 a day

## 2013-01-11 NOTE — Telephone Encounter (Signed)
VM box is not setup yet. Copy mailed     KP

## 2013-01-12 ENCOUNTER — Other Ambulatory Visit: Payer: Self-pay | Admitting: Family Medicine

## 2013-01-12 ENCOUNTER — Telehealth: Payer: Self-pay | Admitting: Family Medicine

## 2013-01-12 DIAGNOSIS — R350 Frequency of micturition: Secondary | ICD-10-CM

## 2013-01-12 NOTE — Telephone Encounter (Signed)
Spoke with pt who states she continues to have frequent urination and requests referral to urology since blood work was essentially neg for DM or UTI. Pt also advised to take iron as prescribed, (she stopped it due to constipation) and to add OTC stool softeners prn

## 2013-01-12 NOTE — Telephone Encounter (Signed)
Patient would like someone to call her regarding her recent lab results.

## 2013-01-12 NOTE — Telephone Encounter (Signed)
Referral put in.

## 2013-01-17 ENCOUNTER — Encounter: Payer: Self-pay | Admitting: Gastroenterology

## 2013-01-17 ENCOUNTER — Ambulatory Visit (INDEPENDENT_AMBULATORY_CARE_PROVIDER_SITE_OTHER): Payer: PRIVATE HEALTH INSURANCE | Admitting: Gastroenterology

## 2013-01-17 VITALS — BP 100/70 | HR 72 | Ht 68.0 in | Wt 193.4 lb

## 2013-01-17 DIAGNOSIS — K625 Hemorrhage of anus and rectum: Secondary | ICD-10-CM

## 2013-01-17 DIAGNOSIS — K602 Anal fissure, unspecified: Secondary | ICD-10-CM

## 2013-01-17 NOTE — Progress Notes (Signed)
History of Present Illness: A 47-year-old Afro-American female referred for evaluation of rectal bleeding.  Approximately 3 months ago she was experiencing bright red blood per rectum consisting of blood discoloring the water and on the tissue.  She also has a burning and stinging pain with defecation  It may last for hours.  In June she tested Hemoccult positive at her primary care physician's office.  At an ER visit a fissure was diagnosed. Bleeding has been persistent but has subsided over the past 4 weeks.  This has been a recurrent problem.  She underwent colonoscopy about 2012 for similar symptoms and was told to have a polyp.  Rectal pain has been treated with topicals.    Past Medical History  Diagnosis Date  . Hypertension   . Rectal bleeding    Past Surgical History  Procedure Laterality Date  . Appendectomy    . Colonoscopy     family history includes COPD in her mother; Cancer in her maternal uncle and maternal uncle; Hypertension in her maternal aunt and mother. Current Outpatient Prescriptions  Medication Sig Dispense Refill  . Docusate Calcium (STOOL SOFTENER PO) Take by mouth. prn      . omeprazole (PRILOSEC) 40 MG capsule Take 1 capsule (40 mg total) by mouth daily.  30 capsule  11  . ferrous sulfate 325 (65 FE) MG tablet Take 325 mg by mouth daily with breakfast.        . Lidocaine, Anorectal, 5 % CREA Apply to anal area as needed for pain.  45 g  0   No current facility-administered medications for this visit.   Allergies as of 01/17/2013  . (No Known Allergies)    reports that she has never smoked. She has never used smokeless tobacco. She reports that she drinks about 0.6 ounces of alcohol per week. She reports that she does not use illicit drugs.     Review of Systems: Pertinent positive and negative review of systems were noted in the above HPI section. All other review of systems were otherwise negative.  Vital signs were reviewed in today's medical  record Physical Exam: General: Well developed , well nourished, no acute distress Skin: anicteric Head: Normocephalic and atraumatic Eyes:  sclerae anicteric, EOMI Ears: Normal auditory acuity Mouth: No deformity or lesions Neck: Supple, no masses or thyromegaly Lungs: Clear throughout to auscultation Heart: Regular rate and rhythm; no murmurs, rubs or bruits Abdomen: Soft, non tender and non distended. No masses, hepatosplenomegaly or hernias noted. Normal Bowel sounds Rectal: Skin tags are present.  There is a fissure at the posterior midline. Musculoskeletal: Symmetrical with no gross deformities  Skin: No lesions on visible extremities Pulses:  Normal pulses noted Extremities: No clubbing, cyanosis, edema or deformities noted Neurological: Alert oriented x 4, grossly nonfocal Cervical Nodes:  No significant cervical adenopathy Inguinal Nodes: No significant inguinal adenopathy Psychological:  Alert and cooperative. Normal mood and affect

## 2013-01-17 NOTE — Patient Instructions (Addendum)
You will pick up this prescription at [facility] Use a stool softner daily Warm Soaks Follow up in 2 weeks

## 2013-01-17 NOTE — Assessment & Plan Note (Signed)
See treatment under rectal bleeding

## 2013-01-17 NOTE — Assessment & Plan Note (Signed)
Limited rectal bleeding is very likely due to 2 a persistent fissure.  Recommendations #1 diltiazem ointment #2 stool softeners

## 2013-07-07 ENCOUNTER — Encounter: Payer: Self-pay | Admitting: Family Medicine

## 2013-07-07 ENCOUNTER — Ambulatory Visit (INDEPENDENT_AMBULATORY_CARE_PROVIDER_SITE_OTHER): Payer: PRIVATE HEALTH INSURANCE | Admitting: Family Medicine

## 2013-07-07 VITALS — BP 116/80 | HR 67 | Temp 98.5°F | Wt 195.0 lb

## 2013-07-07 DIAGNOSIS — L0231 Cutaneous abscess of buttock: Secondary | ICD-10-CM

## 2013-07-07 DIAGNOSIS — L03317 Cellulitis of buttock: Principal | ICD-10-CM

## 2013-07-07 MED ORDER — CEPHALEXIN 500 MG PO CAPS: 500.0000 mg | ORAL_CAPSULE | Freq: Two times a day (BID) | ORAL | Status: DC

## 2013-07-07 NOTE — Patient Instructions (Signed)
Abscess An abscess is an infected area that contains a collection of pus and debris.It can occur in almost any part of the body. An abscess is also known as a furuncle or boil. CAUSES  An abscess occurs when tissue gets infected. This can occur from blockage of oil or sweat glands, infection of hair follicles, or a minor injury to the skin. As the body tries to fight the infection, pus collects in the area and creates pressure under the skin. This pressure causes pain. People with weakened immune systems have difficulty fighting infections and get certain abscesses more often.  SYMPTOMS Usually an abscess develops on the skin and becomes a painful mass that is red, warm, and tender. If the abscess forms under the skin, you may feel a moveable soft area under the skin. Some abscesses break open (rupture) on their own, but most will continue to get worse without care. The infection can spread deeper into the body and eventually into the bloodstream, causing you to feel ill.  DIAGNOSIS  Your caregiver will take your medical history and perform a physical exam. A sample of fluid may also be taken from the abscess to determine what is causing your infection. TREATMENT  Your caregiver may prescribe antibiotic medicines to fight the infection. However, taking antibiotics alone usually does not cure an abscess. Your caregiver may need to make a small cut (incision) in the abscess to drain the pus. In some cases, gauze is packed into the abscess to reduce pain and to continue draining the area. HOME CARE INSTRUCTIONS   Only take over-the-counter or prescription medicines for pain, discomfort, or fever as directed by your caregiver.  If you were prescribed antibiotics, take them as directed. Finish them even if you start to feel better.  If gauze is used, follow your caregiver's directions for changing the gauze.  To avoid spreading the infection:  Keep your draining abscess covered with a  bandage.  Wash your hands well.  Do not share personal care items, towels, or whirlpools with others.  Avoid skin contact with others.  Keep your skin and clothes clean around the abscess.  Keep all follow-up appointments as directed by your caregiver. SEEK MEDICAL CARE IF:   You have increased pain, swelling, redness, fluid drainage, or bleeding.  You have muscle aches, chills, or a general ill feeling.  You have a fever. MAKE SURE YOU:   Understand these instructions.  Will watch your condition.  Will get help right away if you are not doing well or get worse. Document Released: 01/21/2005 Document Revised: 10/13/2011 Document Reviewed: 06/26/2011 ExitCare Patient Information 2014 [employer].  

## 2013-07-07 NOTE — Progress Notes (Signed)
Pre visit review using our clinic review tool, if applicable. No additional management support is needed unless otherwise documented below in the visit note. 

## 2013-07-07 NOTE — Progress Notes (Signed)
Patient ID: ELLERIE ARENZ, female   DOB: 11-29-1965, 48 y.o.   MRN: [id number]   Subjective:    Patient ID: Cherie Dark, female    DOB: 1965/11/29, 48 y.o.   MRN: [id number] HPI Pt here c/o abscess perirectally.  No fever.  + pain It is draining.           Objective:    BP 116/80  Pulse 67  Temp(Src) 98.5 F (36.9 C) (Oral)  Wt 195 lb (88.451 kg)  SpO2 99% General appearance: alert, cooperative and appears stated age Skin: + perirectal abscess draining , tender        Assessment & Plan:  1. Cellulitis and abscess of buttock abx per orders Refer to gyn/ surgery if persist Warm soaks

## 2013-08-03 ENCOUNTER — Encounter: Payer: Self-pay | Admitting: Family Medicine

## 2013-08-03 ENCOUNTER — Ambulatory Visit (INDEPENDENT_AMBULATORY_CARE_PROVIDER_SITE_OTHER): Payer: PRIVATE HEALTH INSURANCE | Admitting: Family Medicine

## 2013-08-03 VITALS — BP 116/78 | HR 80 | Temp 98.3°F | Wt 196.0 lb

## 2013-08-03 DIAGNOSIS — R609 Edema, unspecified: Secondary | ICD-10-CM

## 2013-08-03 DIAGNOSIS — D509 Iron deficiency anemia, unspecified: Secondary | ICD-10-CM

## 2013-08-03 MED ORDER — FUROSEMIDE 40 MG PO TABS: 40.0000 mg | ORAL_TABLET | Freq: Every day | ORAL | Status: DC

## 2013-08-03 MED ORDER — FERRALET 90 90-1 MG PO TABS: ORAL_TABLET | ORAL | Status: DC

## 2013-08-03 NOTE — Progress Notes (Signed)
Pre visit review using our clinic review tool, if applicable. No additional management support is needed unless otherwise documented below in the visit note. 

## 2013-08-03 NOTE — Progress Notes (Signed)
  Subjective:    Jo Compton is a 49 y.o. female who presents for evaluation of edema in both ankles and feet. The edema has been moderate. Onset of symptoms was several months ago, and patient reports symptoms have gradually worsened since that time. The edema is present in the evening. The patient states the problem is long-standing. The swelling has been aggravated by dependency of involved area and increased salt intake. The swelling has been relieved by support stockings, elevation of involved area. Associated factors include: nothing. Cardiac risk factors: obesity (BMI >= 30 kg/m2) and sedentary lifestyle.  The following portions of the patient's history were reviewed and updated as appropriate: allergies, current medications, past family history, past medical history, past social history, past surgical history and problem list.  Review of Systems Pertinent items are noted in HPI.   Objective:    BP 116/78  Pulse 80  Temp(Src) 98.3 F (36.8 C) (Oral)  Wt 196 lb (88.905 kg)  SpO2 98%  LMP 07/24/2013 General appearance: alert, cooperative, appears stated age and no distress Throat: lips, mucosa, and tongue normal; teeth and gums normal Neck: no adenopathy, supple, symmetrical, trachea midline and thyroid not enlarged, symmetric, no tenderness/mass/nodules Lungs: clear to auscultation bilaterally Heart: S1, S2 normal Extremities: extremities normal, atraumatic, no cyanosis or edema   Cardiographics ECG: not done  Imaging Chest x-ray: not indicated   Assessment:     Edema.    Plan:    Recommendations: decrease sodium in the diet, elevate feet above the level of the heart whenever possible, increase physical activity and use of compression stockings. The patient was also instructed to call IMMEDIATELY (i.e., day or night) if any cardiopulmonary symptoms occur, especially chest pain, shortness of breath, dyspnea on exertion, paroxysmal nocturnal dyspnea, or orthopnea, and  these were explained. Follow up in 1 month and as needed.

## 2013-08-03 NOTE — Patient Instructions (Signed)
Edema Edema is an abnormal build-up of fluids in tissues. Because this is partly dependent on gravity (water flows to the lowest place), it is more common in the legs and thighs (lower extremities). It is also common in the looser tissues, like around the eyes. Painless swelling of the feet and ankles is common and increases as a person ages. It may affect both legs and may include the calves or even thighs. When squeezed, the fluid may move out of the affected area and may leave a dent for a few moments. CAUSES   Prolonged standing or sitting in one place for extended periods of time. Movement helps pump tissue fluid into the veins, and absence of movement prevents this, resulting in edema.  Varicose veins. The valves in the veins do not work as well as they should. This causes fluid to leak into the tissues.  Fluid and salt overload.  Injury, burn, or surgery to the leg, ankle, or foot, may damage veins and allow fluid to leak out.  Sunburn damages vessels. Leaky vessels allow fluid to go out into the sunburned tissues.  Allergies (from insect bites or stings, medications or chemicals) cause swelling by allowing vessels to become leaky.  Protein in the blood helps keep fluid in your vessels. Low protein, as in malnutrition, allows fluid to leak out.  Hormonal changes, including pregnancy and menstruation, cause fluid retention. This fluid may leak out of vessels and cause edema.  Medications that cause fluid retention. Examples are sex hormones, blood pressure medications, steroid treatment, or anti-depressants.  Some illnesses cause edema, especially heart failure, kidney disease, or liver disease.  Surgery that cuts veins or lymph nodes, such as surgery done for the heart or for breast cancer, may result in edema. DIAGNOSIS  Your caregiver is usually easily able to determine what is causing your swelling (edema) by simply asking what is wrong (getting a history) and examining you (doing  a physical). Sometimes x-rays, EKG (electrocardiogram or heart tracing), and blood work may be done to evaluate for underlying medical illness. TREATMENT  General treatment includes:  Leg elevation (or elevation of the affected body part).  Restriction of fluid intake.  Prevention of fluid overload.  Compression of the affected body part. Compression with elastic bandages or support stockings squeezes the tissues, preventing fluid from entering and forcing it back into the blood vessels.  Diuretics (also called water pills or fluid pills) pull fluid out of your body in the form of increased urination. These are effective in reducing the swelling, but can have side effects and must be used only under your caregiver's supervision. Diuretics are appropriate only for some types of edema. The specific treatment can be directed at any underlying causes discovered. Heart, liver, or kidney disease should be treated appropriately. HOME CARE INSTRUCTIONS   Elevate the legs (or affected body part) above the level of the heart, while lying down.  Avoid sitting or standing still for prolonged periods of time.  Avoid putting anything directly under the knees when lying down, and do not wear constricting clothing or garters on the upper legs.  Exercising the legs causes the fluid to work back into the veins and lymphatic channels. This may help the swelling go down.  The pressure applied by elastic bandages or support stockings can help reduce ankle swelling.  A low-salt diet may help reduce fluid retention and decrease the ankle swelling.  Take any medications exactly as prescribed. SEEK MEDICAL CARE IF:  Your edema is   not responding to recommended treatments. SEEK IMMEDIATE MEDICAL CARE IF:   You develop shortness of breath or chest pain.  You cannot breathe when you lay down; or if, while lying down, you have to get up and go to the window to get your breath.  You are having increasing  swelling without relief from treatment.  You develop a fever over 102 F (38.9 C).  You develop pain or redness in the areas that are swollen.  Tell your caregiver right away if you have gained 03 lb/1.4 kg in 1 day or 05 lb/2.3 kg in a week. MAKE SURE YOU:   Understand these instructions.  Will watch your condition.  Will get help right away if you are not doing well or get worse. Document Released: 04/13/2005 Document Revised: 10/13/2011 Document Reviewed: 11/30/2007 Exit[employer] Patient Information 2014 [employer].  

## 2013-08-03 NOTE — Assessment & Plan Note (Signed)
Check labs ferrelet daily

## 2013-08-04 LAB — CBC WITH DIFFERENTIAL/PLATELET
BASOS ABS: 0 10*3/uL (ref 0.0–0.1)
Basophils Relative: 0.5 % (ref 0.0–3.0)
EOS ABS: 0 10*3/uL (ref 0.0–0.7)
Eosinophils Relative: 0.8 % (ref 0.0–5.0)
HCT: 32.4 % — ABNORMAL LOW (ref 36.0–46.0)
Hemoglobin: 10.5 g/dL — ABNORMAL LOW (ref 12.0–15.0)
LYMPHS PCT: 24.4 % (ref 12.0–46.0)
Lymphs Abs: 1.3 10*3/uL (ref 0.7–4.0)
MCHC: 32.3 g/dL (ref 30.0–36.0)
MCV: 76 fl — AB (ref 78.0–100.0)
MONO ABS: 0.5 10*3/uL (ref 0.1–1.0)
Monocytes Relative: 10 % (ref 3.0–12.0)
NEUTROS PCT: 64.3 % (ref 43.0–77.0)
Neutro Abs: 3.4 10*3/uL (ref 1.4–7.7)
PLATELETS: 303 10*3/uL (ref 150.0–400.0)
RBC: 4.26 Mil/uL (ref 3.87–5.11)
RDW: 16.1 % — AB (ref 11.5–14.6)
WBC: 5.2 10*3/uL (ref 4.5–10.5)

## 2013-08-04 LAB — BASIC METABOLIC PANEL
BUN: 9 mg/dL (ref 6–23)
CALCIUM: 9.1 mg/dL (ref 8.4–10.5)
CHLORIDE: 106 meq/L (ref 96–112)
CO2: 26 meq/L (ref 19–32)
CREATININE: 0.7 mg/dL (ref 0.4–1.2)
GFR: 112.26 mL/min (ref >60.00)
GLUCOSE: 87 mg/dL (ref 70–99)
Potassium: 3.7 mEq/L (ref 3.5–5.1)
Sodium: 138 mEq/L (ref 135–145)

## 2013-08-04 LAB — IBC PANEL
Iron: 41 ug/dL — ABNORMAL LOW (ref 42–145)
SATURATION RATIOS: 10.3 % — AB (ref 20.0–50.0)
TRANSFERRIN: 284.4 mg/dL (ref 212.0–360.0)

## 2013-10-11 ENCOUNTER — Other Ambulatory Visit: Payer: Self-pay | Admitting: Gastroenterology

## 2014-01-19 ENCOUNTER — Other Ambulatory Visit: Payer: Self-pay

## 2014-01-19 DIAGNOSIS — R609 Edema, unspecified: Secondary | ICD-10-CM

## 2014-01-19 MED ORDER — FUROSEMIDE 40 MG PO TABS: 40.0000 mg | ORAL_TABLET | Freq: Every day | ORAL | Status: DC

## 2014-02-04 ENCOUNTER — Encounter (HOSPITAL_BASED_OUTPATIENT_CLINIC_OR_DEPARTMENT_OTHER): Payer: Self-pay | Admitting: Emergency Medicine

## 2014-02-04 ENCOUNTER — Emergency Department (HOSPITAL_BASED_OUTPATIENT_CLINIC_OR_DEPARTMENT_OTHER)
Admission: EM | Admit: 2014-02-04 | Discharge: 2014-02-04 | Disposition: A | Discharge status: Home / Self Care | Payer: PRIVATE HEALTH INSURANCE | Attending: Emergency Medicine | Admitting: Emergency Medicine

## 2014-02-04 ENCOUNTER — Emergency Department (HOSPITAL_BASED_OUTPATIENT_CLINIC_OR_DEPARTMENT_OTHER): Payer: PRIVATE HEALTH INSURANCE

## 2014-02-04 DIAGNOSIS — Y92410 Unspecified street and highway as the place of occurrence of the external cause: Secondary | ICD-10-CM | POA: Diagnosis not present

## 2014-02-04 DIAGNOSIS — I10 Essential (primary) hypertension: Secondary | ICD-10-CM | POA: Diagnosis not present

## 2014-02-04 DIAGNOSIS — Z79899 Other long term (current) drug therapy: Secondary | ICD-10-CM | POA: Diagnosis not present

## 2014-02-04 DIAGNOSIS — S3992XA Unspecified injury of lower back, initial encounter: Secondary | ICD-10-CM | POA: Diagnosis present

## 2014-02-04 DIAGNOSIS — S4991XA Unspecified injury of right shoulder and upper arm, initial encounter: Secondary | ICD-10-CM | POA: Diagnosis not present

## 2014-02-04 DIAGNOSIS — Y9389 Activity, other specified: Secondary | ICD-10-CM | POA: Diagnosis not present

## 2014-02-04 DIAGNOSIS — Z8719 Personal history of other diseases of the digestive system: Secondary | ICD-10-CM | POA: Insufficient documentation

## 2014-02-04 DIAGNOSIS — S4992XA Unspecified injury of left shoulder and upper arm, initial encounter: Secondary | ICD-10-CM | POA: Insufficient documentation

## 2014-02-04 MED ORDER — CYCLOBENZAPRINE HCL 10 MG PO TABS: 10.0000 mg | ORAL_TABLET | Freq: Two times a day (BID) | ORAL | Status: DC | PRN

## 2014-02-04 MED ORDER — KETOROLAC TROMETHAMINE 30 MG/ML IJ SOLN
60.0000 mg | Freq: Once | INTRAMUSCULAR | Status: AC
  Administered 2014-02-04: 60 mg via INTRAMUSCULAR
  Filled 2014-02-04: qty 2

## 2014-02-04 NOTE — Discharge Instructions (Signed)
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
## 2014-06-25 ENCOUNTER — Other Ambulatory Visit: Payer: Self-pay | Admitting: Family Medicine

## 2014-08-23 ENCOUNTER — Other Ambulatory Visit: Payer: Self-pay | Admitting: Family Medicine

## 2014-09-26 ENCOUNTER — Other Ambulatory Visit: Payer: Self-pay | Admitting: Family Medicine

## 2014-09-27 NOTE — Telephone Encounter (Signed)
Appointment scheduled for 10/22/14. Patient requesting that meds that she had requested be sent in. Thanks!

## 2014-09-27 NOTE — Telephone Encounter (Signed)
#  30 only-- sent to the pharmacy.       KP

## 2014-09-27 NOTE — Telephone Encounter (Signed)
Please offer this patient an apt for a follow up.     KP

## 2014-10-22 ENCOUNTER — Ambulatory Visit: Payer: PRIVATE HEALTH INSURANCE | Admitting: Family Medicine

## 2014-10-22 DIAGNOSIS — Z0289 Encounter for other administrative examinations: Secondary | ICD-10-CM

## 2014-10-23 ENCOUNTER — Telehealth: Payer: Self-pay | Admitting: Family Medicine

## 2014-10-23 ENCOUNTER — Encounter: Payer: Self-pay | Admitting: Family Medicine

## 2014-10-23 NOTE — Telephone Encounter (Signed)
Pt was no show 10/22/14 2:15pm, follow up 15 appt, has not rescheduled, mailing letter, charge?

## 2014-10-23 NOTE — Telephone Encounter (Signed)
yes

## 2014-12-04 ENCOUNTER — Ambulatory Visit (INDEPENDENT_AMBULATORY_CARE_PROVIDER_SITE_OTHER): Payer: PRIVATE HEALTH INSURANCE | Admitting: Family Medicine

## 2014-12-04 ENCOUNTER — Encounter: Payer: Self-pay | Admitting: Family Medicine

## 2014-12-04 VITALS — BP 116/81 | HR 65 | Temp 98.1°F | Ht 68.0 in | Wt 192.2 lb

## 2014-12-04 DIAGNOSIS — N390 Urinary tract infection, site not specified: Secondary | ICD-10-CM | POA: Diagnosis not present

## 2014-12-04 DIAGNOSIS — R609 Edema, unspecified: Secondary | ICD-10-CM

## 2014-12-04 DIAGNOSIS — F32A Depression, unspecified: Secondary | ICD-10-CM

## 2014-12-04 DIAGNOSIS — R82998 Other abnormal findings in urine: Secondary | ICD-10-CM

## 2014-12-04 DIAGNOSIS — F430 Acute stress reaction: Secondary | ICD-10-CM | POA: Diagnosis not present

## 2014-12-04 DIAGNOSIS — R350 Frequency of micturition: Secondary | ICD-10-CM | POA: Diagnosis not present

## 2014-12-04 DIAGNOSIS — F329 Major depressive disorder, single episode, unspecified: Secondary | ICD-10-CM

## 2014-12-04 LAB — POCT URINALYSIS DIPSTICK
Glucose, UA: NEGATIVE
Ketones, UA: NEGATIVE
NITRITE UA: NEGATIVE
PH UA: 6
UROBILINOGEN UA: 2

## 2014-12-04 MED ORDER — FUROSEMIDE 20 MG PO TABS: 20.0000 mg | ORAL_TABLET | Freq: Every day | ORAL | Status: DC

## 2014-12-04 MED ORDER — ESCITALOPRAM OXALATE 10 MG PO TABS: 10.0000 mg | ORAL_TABLET | Freq: Every day | ORAL | Status: DC
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
## 2014-12-04 NOTE — Patient Instructions (Signed)

## 2014-12-04 NOTE — Progress Notes (Signed)
Pre visit review using our clinic review tool, if applicable. No additional management support is needed unless otherwise documented below in the visit note. 

## 2014-12-05 DIAGNOSIS — F32A Depression, unspecified: Secondary | ICD-10-CM | POA: Insufficient documentation

## 2014-12-05 DIAGNOSIS — F329 Major depressive disorder, single episode, unspecified: Secondary | ICD-10-CM | POA: Insufficient documentation

## 2014-12-05 NOTE — Assessment & Plan Note (Signed)
lexapro 10 mg qd rto 4-6 weeks

## 2014-12-06 LAB — URINE CULTURE: Colony Count: 60000

## 2014-12-15 ENCOUNTER — Encounter: Payer: Self-pay | Admitting: Family Medicine

## 2015-03-13 ENCOUNTER — Other Ambulatory Visit: Payer: Self-pay | Admitting: Family Medicine

## 2015-03-17 ENCOUNTER — Emergency Department (HOSPITAL_BASED_OUTPATIENT_CLINIC_OR_DEPARTMENT_OTHER): Payer: PRIVATE HEALTH INSURANCE

## 2015-03-17 ENCOUNTER — Encounter (HOSPITAL_BASED_OUTPATIENT_CLINIC_OR_DEPARTMENT_OTHER): Payer: Self-pay | Admitting: *Deleted

## 2015-03-17 ENCOUNTER — Emergency Department (HOSPITAL_BASED_OUTPATIENT_CLINIC_OR_DEPARTMENT_OTHER)
Admission: EM | Admit: 2015-03-17 | Discharge: 2015-03-17 | Disposition: A | Discharge status: Home / Self Care | Payer: PRIVATE HEALTH INSURANCE | Source: Home / Self Care | Attending: Emergency Medicine | Admitting: Emergency Medicine

## 2015-03-17 DIAGNOSIS — D259 Leiomyoma of uterus, unspecified: Secondary | ICD-10-CM | POA: Insufficient documentation

## 2015-03-17 DIAGNOSIS — K644 Residual hemorrhoidal skin tags: Secondary | ICD-10-CM

## 2015-03-17 DIAGNOSIS — Z9889 Other specified postprocedural states: Secondary | ICD-10-CM

## 2015-03-17 DIAGNOSIS — Z79899 Other long term (current) drug therapy: Secondary | ICD-10-CM

## 2015-03-17 DIAGNOSIS — I10 Essential (primary) hypertension: Secondary | ICD-10-CM | POA: Insufficient documentation

## 2015-03-17 DIAGNOSIS — N938 Other specified abnormal uterine and vaginal bleeding: Secondary | ICD-10-CM | POA: Insufficient documentation

## 2015-03-17 DIAGNOSIS — R1084 Generalized abdominal pain: Secondary | ICD-10-CM

## 2015-03-17 DIAGNOSIS — N920 Excessive and frequent menstruation with regular cycle: Secondary | ICD-10-CM | POA: Diagnosis not present

## 2015-03-17 DIAGNOSIS — Z3202 Encounter for pregnancy test, result negative: Secondary | ICD-10-CM

## 2015-03-17 DIAGNOSIS — R112 Nausea with vomiting, unspecified: Secondary | ICD-10-CM

## 2015-03-17 LAB — CBC WITH DIFFERENTIAL/PLATELET
BASOS PCT: 0 %
Basophils Absolute: 0 10*3/uL (ref 0.0–0.1)
EOS ABS: 0.2 10*3/uL (ref 0.0–0.7)
Eosinophils Relative: 3 %
HCT: 27.2 % — ABNORMAL LOW (ref 36.0–46.0)
HEMOGLOBIN: 8.2 g/dL — AB (ref 12.0–15.0)
LYMPHS ABS: 1.1 10*3/uL (ref 0.7–4.0)
Lymphocytes Relative: 23 %
MCH: 21.5 pg — AB (ref 26.0–34.0)
MCHC: 30.1 g/dL (ref 30.0–36.0)
MCV: 71.4 fL — ABNORMAL LOW (ref 78.0–100.0)
MONOS PCT: 11 %
Monocytes Absolute: 0.5 10*3/uL (ref 0.1–1.0)
NEUTROS ABS: 2.9 10*3/uL (ref 1.7–7.7)
NEUTROS PCT: 62 %
PLATELETS: 354 10*3/uL (ref 150–400)
RBC: 3.81 MIL/uL — AB (ref 3.87–5.11)
RDW: 18 % — ABNORMAL HIGH (ref 11.5–15.5)
WBC: 4.7 10*3/uL (ref 4.0–10.5)

## 2015-03-17 LAB — URINALYSIS, ROUTINE W REFLEX MICROSCOPIC
BILIRUBIN URINE: NEGATIVE
GLUCOSE, UA: NEGATIVE mg/dL
Ketones, ur: NEGATIVE mg/dL
Nitrite: NEGATIVE
PROTEIN: 30 mg/dL — AB
Specific Gravity, Urine: 1.015 (ref 1.005–1.030)
pH: 6 (ref 5.0–8.0)

## 2015-03-17 LAB — URINE MICROSCOPIC-ADD ON

## 2015-03-17 LAB — COMPREHENSIVE METABOLIC PANEL
ALT: 10 U/L — AB (ref 14–54)
ANION GAP: 4 — AB (ref 5–15)
AST: 15 U/L (ref 15–41)
Albumin: 3.3 g/dL — ABNORMAL LOW (ref 3.5–5.0)
Alkaline Phosphatase: 42 U/L (ref 38–126)
BUN: 6 mg/dL (ref 6–20)
CHLORIDE: 108 mmol/L (ref 101–111)
CO2: 25 mmol/L (ref 22–32)
Calcium: 9 mg/dL (ref 8.9–10.3)
Creatinine, Ser: 0.82 mg/dL (ref 0.44–1.00)
Glucose, Bld: 95 mg/dL (ref 65–99)
POTASSIUM: 4.1 mmol/L (ref 3.5–5.1)
SODIUM: 137 mmol/L (ref 135–145)
Total Bilirubin: 0.4 mg/dL (ref 0.3–1.2)
Total Protein: 6.7 g/dL (ref 6.5–8.1)

## 2015-03-17 LAB — LIPASE, BLOOD: LIPASE: 26 U/L (ref 11–51)

## 2015-03-17 LAB — PREGNANCY, URINE: PREG TEST UR: NEGATIVE

## 2015-03-17 MED ORDER — HYDROCODONE-ACETAMINOPHEN 5-325 MG PO TABS: 1.0000 | ORAL_TABLET | Freq: Four times a day (QID) | ORAL | Status: DC | PRN

## 2015-03-17 MED ORDER — SODIUM CHLORIDE 0.9 % IV SOLN
INTRAVENOUS | Status: DC
  Administered 2015-03-17: 10:00:00 via INTRAVENOUS

## 2015-03-17 MED ORDER — IOHEXOL 300 MG/ML  SOLN
25.0000 mL | Freq: Once | INTRAMUSCULAR | Status: AC | PRN
Start: 2015-03-17 — End: 2015-03-17
  Administered 2015-03-17: 25 mL via ORAL

## 2015-03-17 MED ORDER — ONDANSETRON HCL 4 MG/2ML IJ SOLN
4.0000 mg | Freq: Once | INTRAMUSCULAR | Status: AC
  Administered 2015-03-17: 4 mg via INTRAVENOUS
  Filled 2015-03-17: qty 2

## 2015-03-17 MED ORDER — SODIUM CHLORIDE 0.9 % IV BOLUS (SEPSIS)
500.0000 mL | Freq: Once | INTRAVENOUS | Status: AC
  Administered 2015-03-17: 500 mL via INTRAVENOUS

## 2015-03-17 MED ORDER — IOHEXOL 300 MG/ML  SOLN
100.0000 mL | Freq: Once | INTRAMUSCULAR | Status: AC | PRN
  Administered 2015-03-17: 100 mL via INTRAVENOUS

## 2015-03-17 MED ORDER — HYDROMORPHONE HCL 1 MG/ML IJ SOLN
1.0000 mg | Freq: Once | INTRAMUSCULAR | Status: AC
  Administered 2015-03-17: 1 mg via INTRAVENOUS
  Filled 2015-03-17: qty 1

## 2015-03-17 NOTE — ED Provider Notes (Signed)
CSN: [id number]     Arrival date & time 03/17/15  0801 History   First MD Initiated Contact with Patient 03/17/15 [phone]     Chief Complaint  Patient presents with  . Abdominal Pain     (Consider location/radiation/quality/duration/timing/severity/associated sxs/prior Treatment) Patient is a 49 y.o. female presenting with abdominal pain. The history is provided by the patient.  Abdominal Pain Associated symptoms: constipation, nausea, vaginal bleeding and vomiting   Associated symptoms: no chest pain, no dysuria, no fever and no shortness of breath    patient presents with the complaint of generalized abdominal pain for one week associated with some nausea and vomiting earlier in the week no diarrhea. No nausea vomiting recently. Patient has a history of uterine fibroids and chronic vaginal bleeding due to this. Patient was transfused at [facility] 2 units just 4 days ago. For significant anemia. Patient was asymptomatic at the time. Patient started with the perianal rectal pain about 3 days ago. Thinks that it's hemorrhoids. No bleeding. Patient feels that she's constipated. Patient's been on tramadol for the uterine discomfort for several days. The generalized abdominal pain is different than her uterine pain.  Past Medical History  Diagnosis Date  . Hypertension   . Rectal bleeding    Past Surgical History  Procedure Laterality Date  . Appendectomy    . Colonoscopy     Family History  Problem Relation Age of Onset  . Hypertension Mother   . COPD Mother   . Hypertension Maternal Aunt   . Cancer Maternal Uncle     colon  . Cancer Maternal Uncle     liver   Social History  Substance Use Topics  . Smoking status: Never Smoker   . Smokeless tobacco: Never Used  . Alcohol Use: 0.6 oz/week    1 Glasses of wine per week   OB History    No data available     Review of Systems  Constitutional: Negative for fever.  HENT: Negative for congestion.   Eyes: Negative for  visual disturbance.  Respiratory: Negative for shortness of breath.   Cardiovascular: Negative for chest pain.  Gastrointestinal: Positive for nausea, vomiting, abdominal pain, constipation and rectal pain. Negative for blood in stool and anal bleeding.  Genitourinary: Positive for vaginal bleeding. Negative for dysuria.  Musculoskeletal: Negative for back pain.  Skin: Negative for rash.  Neurological: Negative for dizziness, light-headedness and headaches.  Hematological: Does not bruise/bleed easily.  Psychiatric/Behavioral: Negative for confusion.      Allergies  Review of patient's allergies indicates no known allergies.  Home Medications   Prior to Admission medications   Medication Sig Start Date End Date Taking? Authorizing Provider  escitalopram (LEXAPRO) 10 MG tablet Take 1 tablet (10 mg total) by mouth daily. 12/04/14   Alferd Apa Lowne, DO  Fe Cbn-Fe Gluc-FA-B12-C-DSS (FERRALET 90) 90-1 MG TABS Take 1 tablet by mouth daily. 03/13/15   Rosalita Chessman, DO  furosemide (LASIX) 20 MG tablet Take 1 tablet (20 mg total) by mouth daily. 12/04/14   Rosalita Chessman, DO  HYDROcodone-acetaminophen (NORCO/VICODIN) 5-325 MG tablet Take 1-2 tablets by mouth every 6 (six) hours as needed for moderate pain. 03/17/15   Fredia Sorrow, MD   BP 129/99 mmHg  Pulse 68  Temp(Src) 98 F (36.7 C) (Oral)  Resp 16  Ht 5\' 8"  (1.727 m)  Wt 190 lb (86.183 kg)  BMI 28.90 kg/m2  SpO2 100%  LMP 02/14/2015 (Approximate) Physical Exam  Constitutional: She is oriented  to person, place, and time. She appears well-developed and well-nourished. No distress.  HENT:  Head: Normocephalic and atraumatic.  Mouth/Throat: Oropharynx is clear and moist.  Eyes: Conjunctivae and EOM are normal. Pupils are equal, round, and reactive to light.  Neck: Normal range of motion. Neck supple.  Cardiovascular: Normal rate, regular rhythm and normal heart sounds.   Pulmonary/Chest: Effort normal and breath sounds normal. No  respiratory distress.  Abdominal: Soft. Bowel sounds are normal. She exhibits no mass. There is no tenderness.  Genitourinary:  Perianal area with the several inflamed external hemorrhoids no thrombosis no bleeding no evidence of any anal fissure. Rectal exam was not done due to the discomfort from the external hemorrhoids.   Musculoskeletal: Normal range of motion.  Neurological: She is alert and oriented to person, place, and time. No cranial nerve deficit. She exhibits normal muscle tone. Coordination normal.  Skin: Skin is warm. No rash noted.  Nursing note and vitals reviewed.   ED Course  Procedures (including critical care time) Labs Review Labs Reviewed  URINALYSIS, ROUTINE W REFLEX MICROSCOPIC (NOT AT [facility]) - Abnormal; Notable for the following:    Color, Urine RED (*)    APPearance CLOUDY (*)    Hgb urine dipstick LARGE (*)    Protein, ur 30 (*)    Leukocytes, UA TRACE (*)    All other components within normal limits  URINE MICROSCOPIC-ADD ON - Abnormal; Notable for the following:    Squamous Epithelial / LPF 0-5 (*)    Bacteria, UA MANY (*)    All other components within normal limits  CBC WITH DIFFERENTIAL/PLATELET - Abnormal; Notable for the following:    RBC 3.81 (*)    Hemoglobin 8.2 (*)    HCT 27.2 (*)    MCV 71.4 (*)    MCH 21.5 (*)    RDW 18.0 (*)    All other components within normal limits  COMPREHENSIVE METABOLIC PANEL - Abnormal; Notable for the following:    Albumin 3.3 (*)    ALT 10 (*)    Anion gap 4 (*)    All other components within normal limits  PREGNANCY, URINE  LIPASE, BLOOD   Results for orders placed or performed during the hospital encounter of 03/17/15  Urinalysis, Routine w reflex microscopic (not at [facility])  Result Value Ref Range   Color, Urine RED (A) YELLOW   APPearance CLOUDY (A) CLEAR   Specific Gravity, Urine 1.015 1.005 - 1.030   pH 6.0 5.0 - 8.0   Glucose, UA NEGATIVE NEGATIVE mg/dL   Hgb urine dipstick LARGE (A) NEGATIVE    Bilirubin Urine NEGATIVE NEGATIVE   Ketones, ur NEGATIVE NEGATIVE mg/dL   Protein, ur 30 (A) NEGATIVE mg/dL   Nitrite NEGATIVE NEGATIVE   Leukocytes, UA TRACE (A) NEGATIVE  Pregnancy, urine  Result Value Ref Range   Preg Test, Ur NEGATIVE NEGATIVE  Urine microscopic-add on  Result Value Ref Range   Squamous Epithelial / LPF 0-5 (A) NONE SEEN   WBC, UA 0-5 0 - 5 WBC/hpf   RBC / HPF TOO NUMEROUS TO COUNT 0 - 5 RBC/hpf   Bacteria, UA MANY (A) NONE SEEN   Urine-Other MUCOUS PRESENT   CBC with Differential/Platelet  Result Value Ref Range   WBC 4.7 4.0 - 10.5 K/uL   RBC 3.81 (L) 3.87 - 5.11 MIL/uL   Hemoglobin 8.2 (L) 12.0 - 15.0 g/dL   HCT 27.2 (L) 36.0 - 46.0 %   MCV 71.4 (L) 78.0 - 100.0  fL   MCH 21.5 (L) 26.0 - 34.0 pg   MCHC 30.1 30.0 - 36.0 g/dL   RDW 18.0 (H) 11.5 - 15.5 %   Platelets 354 150 - 400 K/uL   Neutrophils Relative % 62 %   Neutro Abs 2.9 1.7 - 7.7 K/uL   Lymphocytes Relative 23 %   Lymphs Abs 1.1 0.7 - 4.0 K/uL   Monocytes Relative 11 %   Monocytes Absolute 0.5 0.1 - 1.0 K/uL   Eosinophils Relative 3 %   Eosinophils Absolute 0.2 0.0 - 0.7 K/uL   Basophils Relative 0 %   Basophils Absolute 0.0 0.0 - 0.1 K/uL   RBC Morphology TEARDROP CELLS   Comprehensive metabolic panel  Result Value Ref Range   Sodium 137 135 - 145 mmol/L   Potassium 4.1 3.5 - 5.1 mmol/L   Chloride 108 101 - 111 mmol/L   CO2 25 22 - 32 mmol/L   Glucose, Bld 95 65 - 99 mg/dL   BUN 6 6 - 20 mg/dL   Creatinine, Ser 0.82 0.44 - 1.00 mg/dL   Calcium 9.0 8.9 - 10.3 mg/dL   Total Protein 6.7 6.5 - 8.1 g/dL   Albumin 3.3 (L) 3.5 - 5.0 g/dL   AST 15 15 - 41 U/L   ALT 10 (L) 14 - 54 U/L   Alkaline Phosphatase 42 38 - 126 U/L   Total Bilirubin 0.4 0.3 - 1.2 mg/dL   GFR calc non Af Amer >60 >60 mL/min   GFR calc Af Amer >60 >60 mL/min   Anion gap 4 (L) 5 - 15  Lipase, blood  Result Value Ref Range   Lipase 26 11 - 51 U/L     Imaging Review Ct Abdomen Pelvis W Contrast  03/17/2015   CLINICAL DATA:  Patient with history of constipation. Uterine fibroids. Pelvic pain. EXAM: CT ABDOMEN AND PELVIS WITH CONTRAST TECHNIQUE: Multidetector CT imaging of the abdomen and pelvis was performed using the standard protocol following bolus administration of intravenous contrast. CONTRAST:  172mL OMNIPAQUE IOHEXOL 300 MG/ML SOLN, 27mL OMNIPAQUE IOHEXOL 300 MG/ML SOLN COMPARISON:  None. FINDINGS: Lower chest: Normal heart size. Right middle lobe calcified granuloma. No large pleural effusion. Hepatobiliary: Liver is normal in size and contour. Fatty deposition adjacent to the falciform ligament. Gallbladder is decompressed. No intrahepatic or extrahepatic biliary ductal dilatation. Pancreas: Unremarkable Spleen: Unremarkable Adrenals/Urinary Tract: The adrenal glands are normal. Kidneys enhance symmetrically with contrast. Stomach/Bowel: No abnormal bowel wall thickening or evidence for bowel obstruction. There is a nonspecific 10 mm soft tissue nodule along the anterior aspect of ascending colon (image 49; series 2). The stomach is normal in morphology. No free intraperitoneal air. Vascular/Lymphatic: Normal caliber abdominal aorta. No retroperitoneal lymphadenopathy. Other: Uterus is enlarged and heterogeneous in enhancement, most compatible with fibroids. The endometrium is distended and contains mixed density material measuring up to 3 cm. Additionally there is soft tissue fullness within the expected location of the cervix (image 72; series 2). The ovaries are grossly unremarkable. Small amount of fluid within the adnexal bilaterally. Small amount of ascites within the left upper and right lower quadrants. Musculoskeletal: Sclerotic foci within the proximal right femur nonspecific however may represent a bone island. Lumbar spine degenerative changes. IMPRESSION: There is abnormal thickening of endometrium which is nonspecific however may be secondary to a primary endometrial process (hyperplasia or  malignancy) or potentially cervical stenosis. This needs dedicated evaluation with pelvic ultrasound. Fibroid uterus. Nonspecific soft tissue nodule anterior to the ascending colon. This  may potentially be postprocedural from prior appendectomy. If process involving the endometrium is benign, follow-up CT abdomen pelvis in 6 months is recommended to ensure stability. Small amount of ascites. These results were called by telephone at the time of interpretation on 03/17/2015 at 10:39 am to Dr. Fredia Sorrow , who verbally acknowledged these results. Electronically Signed   By: Lovey Newcomer M.D.   On: 03/17/2015 10:43   I have personally reviewed and evaluated these images and lab results as part of my medical decision-making.   EKG Interpretation None      MDM   Final diagnoses:  Dysfunctional uterine bleeding  Uterine leiomyoma, unspecified location  Generalized abdominal pain  External hemorrhoids without complication    Workup shows evidence of the fibroid uterus which was known and marked thickening of the lining the uterus which I suspect is known by her OB/GYN doctor since they are planning a hysterectomy. But also shows evidence of a nodule in the ascending colon which could be from having the appendix removed but further evaluation is needed. Patient's hemoglobin is 8.2 just had a transfusion 2 units for days ago. Good chance that she will drop below 8 sometime in the next few days. Patient will be contacted her OB/GYN on Monday. Right now they have planned hysterectomy for the end of December. Patient may very well require another blood transfusion part of that time.  No evidence of any significant constipation. On rectal exam there was marked inflammation of external hemorrhoids but no thrombosis. No bleeding. Treatment for this will be sits baths and continuing the Preparation H and will treat with pain medicine.      Fredia Sorrow, MD 03/17/15 1112

## 2015-03-17 NOTE — ED Notes (Signed)
Has abd pain, no nausea in the past two days. Appetite poor

## 2015-03-17 NOTE — ED Notes (Signed)
Pt returns from radiology, placed back on cont POX monitoring with int NBP assessments.

## 2015-03-17 NOTE — ED Notes (Signed)
DC instructions reviewed with pt, discussed safety while taking PO narcotics, also discussed importance of calling OB/GYN MD and make an appointment to be seen this week per EDP recommendations. Opportunity for questions provided

## 2015-03-17 NOTE — ED Notes (Signed)
MD at bedside. 

## 2015-03-17 NOTE — ED Notes (Signed)
Pt states she has utilized [employer] and preparation H for comfort measures

## 2015-03-17 NOTE — ED Notes (Signed)
Presents with abd pain x 1 week, decreased BMs

## 2015-03-17 NOTE — Discharge Instructions (Signed)
Blood counts are getting low again. Very important to call your OB/GYN doctor on Monday for follow-up. CT scan also did show a nodule on the ascending part of the large intestines that we'll need further investigation. No evidence of constipation. For the external hemorrhoids recommend sitz bath twice a day and the continuing of the Preparation H. Take pain medicine as needed.

## 2015-03-17 NOTE — ED Notes (Signed)
Placed on cont POX with int NBP

## 2015-03-19 ENCOUNTER — Other Ambulatory Visit: Payer: Self-pay | Admitting: Obstetrics and Gynecology

## 2015-03-19 ENCOUNTER — Encounter (HOSPITAL_COMMUNITY): Payer: Self-pay

## 2015-03-19 NOTE — Patient Instructions (Addendum)
Your procedure is scheduled on:  Tomorrow, Nov. 23, 2016  Enter through the [employer] [facility] at:  7:30 AM  Sign Name on [place] at main desk and have a sit.  Bring Picture ID and Insurance card.  Remember: Do NOT eat food or drink after:  Midnight Tonight Take these medicines the morning of surgery with a SIP OF WATER:  None  Do NOT wear jewelry (body piercing), metal hair clips/bobby pins, make-up, or nail polish. Do NOT wear lotions, powders, or perfumes.  You may wear deoderant. Do NOT shave for 48 hours prior to surgery. Do NOT bring valuables to the hospital. Contacts, dentures, or bridgework may not be worn into surgery. Leave suitcase in car.  After surgery it may be brought to your room.  For patients admitted to the hospital, checkout time is 11:00 AM the day of discharge.

## 2015-03-19 NOTE — H&P (Addendum)
  Admission History and Physical Exam for a Gynecology Patient  [name] is a 49 y.o. female, P 1-0-0-1, who presents for laparoscopy assisted vaginal hysterectomy, bilateral salpingectomy, possible total abdominal hysterectomy. She has been followed at the [facility] and Gynecology division of [facility]. She complains of menorrhagia, fibroids, anemia, and abdominal pain.  She had a D&C by her primary gynecologist.  Pathology report was benign.  She has been transfused 2 units of blood because of a hemoglobin of 7.  Her post-transfusion hemoglobin was 8.2.  She has had a CT scan performed.  No etiology can be found for her pain.  Fibroids were noted.  OB History    No data available      Past Medical History  Diagnosis Date  . Rectal bleeding   . OAB (overactive bladder)   . Depression   . Anemia   . Fissure     rectal    No prescriptions prior to admission    Past Surgical History  Procedure Laterality Date  . Appendectomy    . Colonoscopy      No Known Allergies  Family History: family history includes COPD in her mother; Cancer in her maternal uncle and maternal uncle; Hypertension in her maternal aunt and mother.  Social History:  reports that she has never smoked. She has never used smokeless tobacco. She reports that she drinks about 0.6 oz of alcohol per week. She reports that she does not use illicit drugs.  Review of systems: See HPI.  Admission Physical Exam:    BMI equals 28  Last menstrual period 02/14/2015.  HEENT:                 Within normal limits Chest:                   Clear Heart:                    Regular rate and rhythm Breasts:                No masses, skin changes, bleeding, or discharge present Abdomen:             Nontender, no masses Extremities:          Grossly normal Neurologic exam: Grossly normal  Pelvic exam:  External genitalia: normal general appearance Vaginal: normal without  tenderness, induration or masses and cystocele Cervix: normal appearance Adnexa: normal bimanual exam Uterus: 12-14 week size, irregular  Assessment:  Menorrhagia  Fibroid uterus  Anemia  Pelvic pain  Overweight  Plan:  The patient will undergo a laparoscopy assisted vaginal hysterectomy.  She will also have a bilateral salpingectomy.  She understands that we may need to perform a total abdominal hysterectomy.  She understands that no guarantees can be given concerning total relief of her pain.  She understands the indications for her surgical procedure.  She accepts the risks of, but not limited to, anesthetic complications, bleeding, infections, and possible damage to the surrounding organs.   Eli Hose 03/19/2015

## 2015-03-20 ENCOUNTER — Inpatient Hospital Stay (HOSPITAL_COMMUNITY)
Admission: RE | Admit: 2015-03-20 | Discharge: 2015-03-22 | DRG: 743 | Disposition: A | Discharge status: Home / Self Care | Payer: PRIVATE HEALTH INSURANCE | Source: Ambulatory Visit | Attending: Obstetrics and Gynecology | Admitting: Obstetrics and Gynecology

## 2015-03-20 ENCOUNTER — Encounter (HOSPITAL_COMMUNITY): Payer: Self-pay | Admitting: Anesthesiology

## 2015-03-20 ENCOUNTER — Ambulatory Visit (HOSPITAL_COMMUNITY): Payer: PRIVATE HEALTH INSURANCE | Admitting: Anesthesiology

## 2015-03-20 ENCOUNTER — Encounter (HOSPITAL_COMMUNITY)
Admission: RE | Disposition: A | Discharge status: Home / Self Care | Payer: Self-pay | Source: Ambulatory Visit | Attending: Obstetrics and Gynecology

## 2015-03-20 DIAGNOSIS — N803 Endometriosis of pelvic peritoneum: Secondary | ICD-10-CM | POA: Diagnosis present

## 2015-03-20 DIAGNOSIS — N3289 Other specified disorders of bladder: Secondary | ICD-10-CM | POA: Diagnosis present

## 2015-03-20 DIAGNOSIS — E669 Obesity, unspecified: Secondary | ICD-10-CM | POA: Diagnosis present

## 2015-03-20 DIAGNOSIS — N920 Excessive and frequent menstruation with regular cycle: Principal | ICD-10-CM | POA: Diagnosis present

## 2015-03-20 DIAGNOSIS — N739 Female pelvic inflammatory disease, unspecified: Secondary | ICD-10-CM | POA: Diagnosis present

## 2015-03-20 DIAGNOSIS — I10 Essential (primary) hypertension: Secondary | ICD-10-CM | POA: Diagnosis present

## 2015-03-20 DIAGNOSIS — I878 Other specified disorders of veins: Secondary | ICD-10-CM | POA: Diagnosis present

## 2015-03-20 DIAGNOSIS — D649 Anemia, unspecified: Secondary | ICD-10-CM | POA: Diagnosis present

## 2015-03-20 DIAGNOSIS — D259 Leiomyoma of uterus, unspecified: Secondary | ICD-10-CM | POA: Diagnosis present

## 2015-03-20 HISTORY — PX: ABDOMINAL HYSTERECTOMY: SHX81

## 2015-03-20 HISTORY — DX: Major depressive disorder, single episode, unspecified: F32.9

## 2015-03-20 HISTORY — PX: LAPAROSCOPY: SHX197

## 2015-03-20 HISTORY — DX: Overactive bladder: N32.81

## 2015-03-20 HISTORY — PX: BILATERAL SALPINGECTOMY: SHX5743

## 2015-03-20 HISTORY — DX: Anemia, unspecified: D64.9

## 2015-03-20 HISTORY — PX: CYSTOSCOPY: SHX5120

## 2015-03-20 HISTORY — DX: Other specified disorders of the skin and subcutaneous tissue: L98.8

## 2015-03-20 HISTORY — DX: Depression, unspecified: F32.A

## 2015-03-20 LAB — CBC
HCT: 28.5 % — ABNORMAL LOW (ref 36.0–46.0)
HCT: 25.7 % — ABNORMAL LOW (ref 36.0–46.0)
HEMOGLOBIN: 7.9 g/dL — AB (ref 12.0–15.0)
Hemoglobin: 8.6 g/dL — ABNORMAL LOW (ref 12.0–15.0)
MCH: 21.6 pg — ABNORMAL LOW (ref 26.0–34.0)
MCH: 22 pg — ABNORMAL LOW (ref 26.0–34.0)
MCHC: 30.2 g/dL (ref 30.0–36.0)
MCHC: 30.7 g/dL (ref 30.0–36.0)
MCV: 71.6 fL — AB (ref 78.0–100.0)
MCV: 71.6 fL — ABNORMAL LOW (ref 78.0–100.0)
PLATELETS: 383 10*3/uL (ref 150–400)
Platelets: 321 10*3/uL (ref 150–400)
RBC: 3.98 MIL/uL (ref 3.87–5.11)
RBC: 3.59 MIL/uL — AB (ref 3.87–5.11)
RDW: 18.3 % — ABNORMAL HIGH (ref 11.5–15.5)
RDW: 18.1 % — ABNORMAL HIGH (ref 11.5–15.5)
WBC: 6.4 10*3/uL (ref 4.0–10.5)
WBC: 9.7 10*3/uL (ref 4.0–10.5)

## 2015-03-20 LAB — PREPARE RBC (CROSSMATCH)

## 2015-03-20 LAB — ABO/RH: ABO/RH(D): O POS

## 2015-03-20 LAB — CREATININE, SERUM
CREATININE: 0.71 mg/dL (ref 0.44–1.00)
GFR calc Af Amer: >60 mL/min (ref >60)

## 2015-03-20 LAB — PREGNANCY, URINE: PREG TEST UR: NEGATIVE

## 2015-03-20 SURGERY — HYSTERECTOMY, ABDOMINAL
Anesthesia: General | Site: Bladder

## 2015-03-20 MED ORDER — HYDROCORTISONE ACE-PRAMOXINE 1-1 % RE CREA
TOPICAL_CREAM | Freq: Three times a day (TID) | RECTAL | Status: DC
  Filled 2015-03-20: qty 30

## 2015-03-20 MED ORDER — DIPHENHYDRAMINE HCL 12.5 MG/5ML PO ELIX
12.5000 mg | ORAL_SOLUTION | Freq: Four times a day (QID) | ORAL | Status: DC | PRN
  Administered 2015-03-21: 12.5 mg via ORAL
  Filled 2015-03-20: qty 5

## 2015-03-20 MED ORDER — STERILE WATER FOR IRRIGATION IR SOLN
Status: DC | PRN
  Administered 2015-03-20: 1000 mL via INTRAVESICAL

## 2015-03-20 MED ORDER — IBUPROFEN 800 MG PO TABS: 800.0000 mg | ORAL_TABLET | Freq: Three times a day (TID) | ORAL | Status: DC | PRN

## 2015-03-20 MED ORDER — FENTANYL CITRATE (PF) 100 MCG/2ML IJ SOLN
INTRAMUSCULAR | Status: DC | PRN
  Administered 2015-03-20 (×3): 50 ug via INTRAVENOUS
  Administered 2015-03-20: 100 ug via INTRAVENOUS
  Administered 2015-03-20 (×2): 50 ug via INTRAVENOUS

## 2015-03-20 MED ORDER — SCOPOLAMINE 1 MG/3DAYS TD PT72
MEDICATED_PATCH | TRANSDERMAL | Status: AC
  Administered 2015-03-20: 1.5 mg via TRANSDERMAL
  Filled 2015-03-20: qty 1

## 2015-03-20 MED ORDER — NEOSTIGMINE METHYLSULFATE 10 MG/10ML IV SOLN
INTRAVENOUS | Status: AC
  Filled 2015-03-20: qty 1

## 2015-03-20 MED ORDER — GENTAMICIN SULFATE 40 MG/ML IJ SOLN: 1.5000 mg/kg | Freq: Three times a day (TID) | INTRAVENOUS | Status: DC

## 2015-03-20 MED ORDER — HYDROMORPHONE HCL 1 MG/ML IJ SOLN
INTRAMUSCULAR | Status: AC
  Filled 2015-03-20: qty 1

## 2015-03-20 MED ORDER — METHYLENE BLUE 1 % INJ SOLN
INTRAMUSCULAR | Status: AC
  Filled 2015-03-20: qty 10

## 2015-03-20 MED ORDER — SODIUM CHLORIDE 0.9 % IJ SOLN
INTRAMUSCULAR | Status: AC
  Filled 2015-03-20: qty 10

## 2015-03-20 MED ORDER — DEXTROSE IN LACTATED RINGERS 5 % IV SOLN
INTRAVENOUS | Status: DC
  Administered 2015-03-20 – 2015-03-21 (×3): via INTRAVENOUS

## 2015-03-20 MED ORDER — DEXAMETHASONE SODIUM PHOSPHATE 10 MG/ML IJ SOLN
INTRAMUSCULAR | Status: DC | PRN
  Administered 2015-03-20: 4 mg via INTRAVENOUS

## 2015-03-20 MED ORDER — BUPIVACAINE-EPINEPHRINE 0.5% -1:200000 IJ SOLN
INTRAMUSCULAR | Status: DC | PRN
  Administered 2015-03-20: 3 mL
  Administered 2015-03-20: 17 mL

## 2015-03-20 MED ORDER — GLYCOPYRROLATE 0.2 MG/ML IJ SOLN
INTRAMUSCULAR | Status: AC
  Filled 2015-03-20: qty 3

## 2015-03-20 MED ORDER — ROCURONIUM BROMIDE 100 MG/10ML IV SOLN
INTRAVENOUS | Status: AC
  Filled 2015-03-20: qty 1

## 2015-03-20 MED ORDER — OXYCODONE-ACETAMINOPHEN 5-325 MG PO TABS
1.0000 | ORAL_TABLET | ORAL | Status: DC | PRN
  Administered 2015-03-21: 2 via ORAL
  Administered 2015-03-21: 1 via ORAL
  Administered 2015-03-22 (×2): 2 via ORAL
  Filled 2015-03-20 (×2): qty 1
  Filled 2015-03-20 (×3): qty 2

## 2015-03-20 MED ORDER — MIRABEGRON ER 50 MG PO TB24
50.0000 mg | ORAL_TABLET | Freq: Every day | ORAL | Status: DC
  Filled 2015-03-20 (×2): qty 1

## 2015-03-20 MED ORDER — SODIUM CHLORIDE 0.9 % IJ SOLN
INTRAMUSCULAR | Status: AC
Start: 2015-03-20 — End: 2015-03-20
  Filled 2015-03-20: qty 100

## 2015-03-20 MED ORDER — HYDROMORPHONE 1 MG/ML IV SOLN
INTRAVENOUS | Status: DC
  Administered 2015-03-20: 0.3 mg via INTRAVENOUS
  Administered 2015-03-20: 15:00:00 via INTRAVENOUS
  Administered 2015-03-20: 2.1 mg via INTRAVENOUS
  Administered 2015-03-21: 1.5 mg via INTRAVENOUS
  Administered 2015-03-21: 0.9 mg via INTRAVENOUS
  Filled 2015-03-20: qty 25

## 2015-03-20 MED ORDER — PROPOFOL 10 MG/ML IV BOLUS
INTRAVENOUS | Status: DC | PRN
  Administered 2015-03-20: 200 mg via INTRAVENOUS

## 2015-03-20 MED ORDER — ESCITALOPRAM OXALATE 10 MG PO TABS
10.0000 mg | ORAL_TABLET | Freq: Every day | ORAL | Status: DC
  Administered 2015-03-21: 10 mg via ORAL
  Filled 2015-03-20 (×2): qty 1

## 2015-03-20 MED ORDER — GENTAMICIN SULFATE 40 MG/ML IJ SOLN
Freq: Three times a day (TID) | INTRAVENOUS | Status: AC
  Administered 2015-03-20 – 2015-03-21 (×3): via INTRAVENOUS
  Filled 2015-03-20 (×3): qty 3.25

## 2015-03-20 MED ORDER — SCOPOLAMINE 1 MG/3DAYS TD PT72
1.0000 | MEDICATED_PATCH | Freq: Once | TRANSDERMAL | Status: DC
  Administered 2015-03-20: 1.5 mg via TRANSDERMAL

## 2015-03-20 MED ORDER — LIDOCAINE HCL (CARDIAC) 20 MG/ML IV SOLN
INTRAVENOUS | Status: AC
  Filled 2015-03-20: qty 5

## 2015-03-20 MED ORDER — FUROSEMIDE 20 MG PO TABS
20.0000 mg | ORAL_TABLET | Freq: Every day | ORAL | Status: DC
  Administered 2015-03-21: 20 mg via ORAL
  Filled 2015-03-20 (×2): qty 1

## 2015-03-20 MED ORDER — FENTANYL CITRATE (PF) 100 MCG/2ML IJ SOLN
INTRAMUSCULAR | Status: AC
  Filled 2015-03-20: qty 2

## 2015-03-20 MED ORDER — SODIUM CHLORIDE 0.9 % IJ SOLN: 9.0000 mL | INTRAMUSCULAR | Status: DC | PRN

## 2015-03-20 MED ORDER — ONDANSETRON HCL 4 MG PO TABS: 4.0000 mg | ORAL_TABLET | Freq: Four times a day (QID) | ORAL | Status: DC | PRN

## 2015-03-20 MED ORDER — MIDAZOLAM HCL 2 MG/2ML IJ SOLN
INTRAMUSCULAR | Status: AC
  Filled 2015-03-20: qty 2

## 2015-03-20 MED ORDER — LACTATED RINGERS IV SOLN
INTRAVENOUS | Status: DC
  Administered 2015-03-20 (×3): via INTRAVENOUS

## 2015-03-20 MED ORDER — MIDAZOLAM HCL 2 MG/2ML IJ SOLN
INTRAMUSCULAR | Status: DC | PRN
  Administered 2015-03-20: 2 mg via INTRAVENOUS

## 2015-03-20 MED ORDER — CEFAZOLIN SODIUM-DEXTROSE 2-3 GM-% IV SOLR
2.0000 g | INTRAVENOUS | Status: AC
  Administered 2015-03-20: 2 g via INTRAVENOUS

## 2015-03-20 MED ORDER — SIMETHICONE 80 MG PO CHEW
80.0000 mg | CHEWABLE_TABLET | Freq: Four times a day (QID) | ORAL | Status: DC | PRN
  Administered 2015-03-21: 80 mg via ORAL
  Filled 2015-03-20: qty 1

## 2015-03-20 MED ORDER — NALOXONE HCL 0.4 MG/ML IJ SOLN: 0.4000 mg | INTRAMUSCULAR | Status: DC | PRN

## 2015-03-20 MED ORDER — HYDROMORPHONE HCL 1 MG/ML IJ SOLN
INTRAMUSCULAR | Status: AC
  Administered 2015-03-20: 0.5 mg via INTRAVENOUS
  Filled 2015-03-20: qty 1

## 2015-03-20 MED ORDER — KETOROLAC TROMETHAMINE 30 MG/ML IJ SOLN: 30.0000 mg | Freq: Four times a day (QID) | INTRAMUSCULAR | Status: AC

## 2015-03-20 MED ORDER — METHYLENE BLUE 1 % INJ SOLN
INTRAMUSCULAR | Status: DC | PRN
  Administered 2015-03-20: 7 mL via INTRAVENOUS

## 2015-03-20 MED ORDER — PROPOFOL 10 MG/ML IV BOLUS
INTRAVENOUS | Status: AC
  Filled 2015-03-20: qty 20

## 2015-03-20 MED ORDER — FENTANYL CITRATE (PF) 250 MCG/5ML IJ SOLN
INTRAMUSCULAR | Status: AC
  Filled 2015-03-20: qty 5

## 2015-03-20 MED ORDER — BUPIVACAINE-EPINEPHRINE (PF) 0.5% -1:200000 IJ SOLN
INTRAMUSCULAR | Status: AC
  Filled 2015-03-20: qty 30

## 2015-03-20 MED ORDER — CLINDAMYCIN PHOSPHATE 900 MG/50ML IV SOLN: 900.0000 mg | Freq: Three times a day (TID) | INTRAVENOUS | Status: DC

## 2015-03-20 MED ORDER — NEOSTIGMINE METHYLSULFATE 10 MG/10ML IV SOLN
INTRAVENOUS | Status: DC | PRN
  Administered 2015-03-20: 4 mg via INTRAVENOUS

## 2015-03-20 MED ORDER — ROCURONIUM BROMIDE 100 MG/10ML IV SOLN
INTRAVENOUS | Status: DC | PRN
  Administered 2015-03-20: 50 mg via INTRAVENOUS
  Administered 2015-03-20: 20 mg via INTRAVENOUS

## 2015-03-20 MED ORDER — DIPHENHYDRAMINE HCL 50 MG/ML IJ SOLN: 12.5000 mg | Freq: Four times a day (QID) | INTRAMUSCULAR | Status: DC | PRN

## 2015-03-20 MED ORDER — CEFAZOLIN SODIUM-DEXTROSE 2-3 GM-% IV SOLR
INTRAVENOUS | Status: AC
  Filled 2015-03-20: qty 50

## 2015-03-20 MED ORDER — ONDANSETRON HCL 4 MG/2ML IJ SOLN: 4.0000 mg | Freq: Four times a day (QID) | INTRAMUSCULAR | Status: DC | PRN

## 2015-03-20 MED ORDER — HYDROMORPHONE HCL 1 MG/ML IJ SOLN
0.2500 mg | INTRAMUSCULAR | Status: DC | PRN
  Administered 2015-03-20 (×2): 0.5 mg via INTRAVENOUS

## 2015-03-20 MED ORDER — KETOROLAC TROMETHAMINE 30 MG/ML IJ SOLN
INTRAMUSCULAR | Status: DC | PRN
  Administered 2015-03-20: 30 mg via INTRAVENOUS

## 2015-03-20 MED ORDER — ONDANSETRON HCL 4 MG/2ML IJ SOLN
INTRAMUSCULAR | Status: DC | PRN
  Administered 2015-03-20: 4 mg via INTRAVENOUS

## 2015-03-20 MED ORDER — MEPERIDINE HCL 25 MG/ML IJ SOLN
INTRAMUSCULAR | Status: AC
  Filled 2015-03-20: qty 1

## 2015-03-20 MED ORDER — METOCLOPRAMIDE HCL 5 MG/ML IJ SOLN: 10.0000 mg | Freq: Once | INTRAMUSCULAR | Status: DC | PRN

## 2015-03-20 MED ORDER — ONDANSETRON HCL 4 MG/2ML IJ SOLN
INTRAMUSCULAR | Status: AC
  Filled 2015-03-20: qty 2

## 2015-03-20 MED ORDER — HYDROCORTISONE ACE-PRAMOXINE 1-1 % RE FOAM
1.0000 | Freq: Two times a day (BID) | RECTAL | Status: DC
  Administered 2015-03-20 – 2015-03-21 (×2): 1 via RECTAL
  Filled 2015-03-20: qty 10

## 2015-03-20 MED ORDER — ENOXAPARIN SODIUM 40 MG/0.4ML ~~LOC~~ SOLN
40.0000 mg | SUBCUTANEOUS | Status: DC
  Administered 2015-03-21 – 2015-03-22 (×2): 40 mg via SUBCUTANEOUS
  Filled 2015-03-20 (×2): qty 0.4

## 2015-03-20 MED ORDER — HYDROMORPHONE HCL 1 MG/ML IJ SOLN
INTRAMUSCULAR | Status: DC | PRN
  Administered 2015-03-20: 1 mg via INTRAVENOUS

## 2015-03-20 MED ORDER — KETOROLAC TROMETHAMINE 30 MG/ML IJ SOLN
30.0000 mg | Freq: Four times a day (QID) | INTRAMUSCULAR | Status: AC
  Administered 2015-03-20 – 2015-03-21 (×3): 30 mg via INTRAVENOUS
  Filled 2015-03-20 (×3): qty 1

## 2015-03-20 MED ORDER — LIDOCAINE HCL (CARDIAC) 20 MG/ML IV SOLN
INTRAVENOUS | Status: DC | PRN
  Administered 2015-03-20: 100 mg via INTRAVENOUS

## 2015-03-20 MED ORDER — BISACODYL 10 MG RE SUPP
10.0000 mg | Freq: Every day | RECTAL | Status: DC | PRN
  Administered 2015-03-21: 10 mg via RECTAL
  Filled 2015-03-20: qty 1

## 2015-03-20 MED ORDER — DEXAMETHASONE SODIUM PHOSPHATE 4 MG/ML IJ SOLN
INTRAMUSCULAR | Status: AC
  Filled 2015-03-20: qty 1

## 2015-03-20 MED ORDER — GLYCOPYRROLATE 0.2 MG/ML IJ SOLN
INTRAMUSCULAR | Status: DC | PRN
  Administered 2015-03-20: 0.6 mg via INTRAVENOUS

## 2015-03-20 MED ORDER — 0.9 % SODIUM CHLORIDE (POUR BTL) OPTIME
TOPICAL | Status: DC | PRN
  Administered 2015-03-20: 2000 mL

## 2015-03-20 MED ORDER — VASOPRESSIN 20 UNIT/ML IV SOLN
INTRAVENOUS | Status: AC
  Filled 2015-03-20: qty 1

## 2015-03-20 MED ORDER — MEPERIDINE HCL 25 MG/ML IJ SOLN
6.2500 mg | INTRAMUSCULAR | Status: DC | PRN
  Administered 2015-03-20: 12.5 mg via INTRAVENOUS

## 2015-03-20 SURGICAL SUPPLY — 75 items
CABLE HIGH FREQUENCY MONO STRZ (ELECTRODE) IMPLANT
CANISTER SUCT 3000ML (MISCELLANEOUS) ×6 IMPLANT
CATH FOLEY 3WAY 30CC 16FR (CATHETERS) IMPLANT
CATH ROBINSON RED A/P 16FR (CATHETERS) ×6 IMPLANT
CLOSURE WOUND 1/4 X3 (GAUZE/BANDAGES/DRESSINGS)
CLOTH BEACON ORANGE TIMEOUT ST (SAFETY) ×6 IMPLANT
CONT PATH 16OZ SNAP LID 3702 (MISCELLANEOUS) ×6 IMPLANT
COVER BACK TABLE 60X90IN (DRAPES) ×6 IMPLANT
DECANTER SPIKE VIAL GLASS SM (MISCELLANEOUS) ×18 IMPLANT
DRAPE CESAREAN BIRTH W POUCH (DRAPES) IMPLANT
DRAPE SHEET LG 3/4 BI-LAMINATE (DRAPES) ×6 IMPLANT
DRAPE WARM FLUID 44X44 (DRAPE) ×6 IMPLANT
DRSG COVADERM PLUS 2X2 (GAUZE/BANDAGES/DRESSINGS) IMPLANT
DRSG OPSITE POSTOP 3X4 (GAUZE/BANDAGES/DRESSINGS) ×6 IMPLANT
DRSG OPSITE POSTOP 4X10 (GAUZE/BANDAGES/DRESSINGS) ×6 IMPLANT
DURAPREP 26ML APPLICATOR (WOUND CARE) ×6 IMPLANT
ELECT REM PT RETURN 9FT ADLT (ELECTROSURGICAL) ×6
ELECTRODE REM PT RTRN 9FT ADLT (ELECTROSURGICAL) ×4 IMPLANT
FORMULA 20CAL 3 OZ MEAD (FORMULA) IMPLANT
GAUZE SPONGE 4X4 16PLY XRAY LF (GAUZE/BANDAGES/DRESSINGS) ×6 IMPLANT
GAUZE VASELINE 3X9 (GAUZE/BANDAGES/DRESSINGS) IMPLANT
GLOVE BIOGEL PI IND STRL 6.5 (GLOVE) ×4 IMPLANT
GLOVE BIOGEL PI IND STRL 7.0 (GLOVE) ×12 IMPLANT
GLOVE BIOGEL PI IND STRL 8.5 (GLOVE) ×4 IMPLANT
GLOVE BIOGEL PI INDICATOR 6.5 (GLOVE) ×2
GLOVE BIOGEL PI INDICATOR 7.0 (GLOVE) ×6
GLOVE BIOGEL PI INDICATOR 8.5 (GLOVE) ×2
GLOVE ECLIPSE 8.0 STRL XLNG CF (GLOVE) ×24 IMPLANT
GOWN STRL REUS W/TWL 2XL LVL3 (GOWN DISPOSABLE) ×6 IMPLANT
GOWN STRL REUS W/TWL LRG LVL3 (GOWN DISPOSABLE) ×12 IMPLANT
LEGGING LITHOTOMY PAIR STRL (DRAPES) ×6 IMPLANT
NEEDLE HYPO 18GX1.5 BLUNT FILL (NEEDLE) IMPLANT
NEEDLE HYPO 22GX1.5 SAFETY (NEEDLE) ×6 IMPLANT
NEEDLE MAYO CATGUT SZ4 (NEEDLE) IMPLANT
NS IRRIG 1000ML POUR BTL (IV SOLUTION) ×12 IMPLANT
PACK ABDOMINAL GYN (CUSTOM PROCEDURE TRAY) IMPLANT
PACK LAVH (CUSTOM PROCEDURE TRAY) ×6 IMPLANT
PACK ROBOTIC GOWN (GOWN DISPOSABLE) ×6 IMPLANT
PAD OB MATERNITY 4.3X12.25 (PERSONAL CARE ITEMS) ×6 IMPLANT
PAD POSITIONING PINK XL (MISCELLANEOUS) ×6 IMPLANT
PADDING ION DISPOSABLE (MISCELLANEOUS) IMPLANT
PENCIL BUTTON HOLSTER BLD 10FT (ELECTRODE) ×6 IMPLANT
PENCIL SMOKE EVAC W/HOLSTER (ELECTROSURGICAL) ×12 IMPLANT
PLUG CATH AND CAP STER (CATHETERS) IMPLANT
SET IRRIG TUBING LAPAROSCOPIC (IRRIGATION / IRRIGATOR) ×6 IMPLANT
SOLUTION ELECTROLUBE (MISCELLANEOUS) IMPLANT
SPONGE LAP 18X18 X RAY DECT (DISPOSABLE) ×18 IMPLANT
STAPLER VISISTAT 35W (STAPLE) IMPLANT
STRIP CLOSURE SKIN 1/4X3 (GAUZE/BANDAGES/DRESSINGS) IMPLANT
SUT CHROMIC 1 CT1 27 (SUTURE) IMPLANT
SUT CHROMIC 3 0 SH 27 (SUTURE) ×6 IMPLANT
SUT MNCRL AB 3-0 PS2 27 (SUTURE) ×6 IMPLANT
SUT PDS AB 1 CTX 36 (SUTURE) IMPLANT
SUT VIC AB 0 CT1 18XCR BRD8 (SUTURE) ×12 IMPLANT
SUT VIC AB 0 CT1 27 (SUTURE) ×6
SUT VIC AB 0 CT1 27XBRD ANBCTR (SUTURE) ×12 IMPLANT
SUT VIC AB 0 CT1 8-18 (SUTURE) ×6
SUT VIC AB 2-0 CT1 27 (SUTURE)
SUT VIC AB 2-0 CT1 TAPERPNT 27 (SUTURE) IMPLANT
SUT VIC AB 2-0 SH 27 (SUTURE) ×2
SUT VIC AB 2-0 SH 27XBRD (SUTURE) ×4 IMPLANT
SUT VIC AB 3-0 CT1 27 (SUTURE) ×2
SUT VIC AB 3-0 CT1 TAPERPNT 27 (SUTURE) ×4 IMPLANT
SUT VIC AB 4-0 PS2 27 (SUTURE) ×6 IMPLANT
SUT VICRYL 0 ENDOLOOP (SUTURE) IMPLANT
SUT VICRYL 0 TIES 12 18 (SUTURE) ×6 IMPLANT
SUT VICRYL 0 UR6 27IN ABS (SUTURE) ×12 IMPLANT
SYR 30ML LL (SYRINGE) IMPLANT
SYR 50ML LL SCALE MARK (SYRINGE) IMPLANT
SYR CONTROL 10ML LL (SYRINGE) IMPLANT
TOWEL OR 17X24 6PK STRL BLUE (TOWEL DISPOSABLE) ×12 IMPLANT
TRAY FOLEY CATH SILVER 14FR (SET/KITS/TRAYS/PACK) ×12 IMPLANT
TROCAR BALLN 12MMX100 BLUNT (TROCAR) ×6 IMPLANT
WARMER LAPAROSCOPE (MISCELLANEOUS) ×6 IMPLANT
WATER STERILE IRR 1000ML POUR (IV SOLUTION) IMPLANT

## 2015-03-20 NOTE — Transfer of Care (Signed)
Immediate Anesthesia Transfer of Care Note  Patient: Jo Compton  Procedure(s) Performed: Procedure(s): HYSTERECTOMY ABDOMINAL, EXCISION MESENTERIC NODULE, BLADDER FLAP BIOPSY (Bilateral) LAPAROSCOPY DIAGNOSTIC (N/A) BILATERAL SALPINGECTOMY (Bilateral) CYSTOSCOPY (N/A)  Patient Location: PACU  Anesthesia Type:General  Level of Consciousness: awake, alert  and oriented  Airway & Oxygen Therapy: Patient Spontanous Breathing and Patient connected to nasal cannula oxygen  Post-op Assessment: Report given to RN and Post -op Vital signs reviewed and stable  Post vital signs: Reviewed and stable  Last Vitals:  Filed Vitals:   03/20/15 0752  BP: 110/82  Pulse: 79  Temp: 36.8 C  Resp: 16    Complications: No apparent anesthesia complications

## 2015-03-20 NOTE — Anesthesia Postprocedure Evaluation (Signed)
Anesthesia Post Note  Patient: Jo Compton  Procedure(s) Performed: Procedure(s) (LRB): HYSTERECTOMY ABDOMINAL, EXCISION MESENTERIC NODULE, BLADDER FLAP BIOPSY (Bilateral) LAPAROSCOPY DIAGNOSTIC (N/A) BILATERAL SALPINGECTOMY (Bilateral) CYSTOSCOPY (N/A)  Patient location during evaluation: PACU Anesthesia Type: General Level of consciousness: awake and alert and oriented Pain management: pain level controlled Vital Signs Assessment: post-procedure vital signs reviewed and stable Respiratory status: spontaneous breathing, nonlabored ventilation, respiratory function stable and patient connected to nasal cannula oxygen Cardiovascular status: blood pressure returned to baseline and stable    Last Vitals:  Filed Vitals:   03/20/15 1527 03/20/15 1645  BP: 144/96 125/82  Pulse: 77 72  Temp: 36.8 C 36.6 C  Resp: 13 18    Last Pain:  Filed Vitals:   03/20/15 1648  PainSc: Asleep                 [name] A.

## 2015-03-20 NOTE — Anesthesia Preprocedure Evaluation (Addendum)
Anesthesia Evaluation  Patient identified by MRN, date of birth, ID band Patient awake    Reviewed: Allergy & Precautions, NPO status , Patient's Chart, lab work & pertinent test results  Airway Mallampati: III  TM Distance: >3 FB Neck ROM: Full    Dental no notable dental hx. (+) Teeth Intact   Pulmonary neg pulmonary ROS,    Pulmonary exam normal breath sounds clear to auscultation       Cardiovascular hypertension, Normal cardiovascular exam Rhythm:Regular Rate:Normal     Neuro/Psych PSYCHIATRIC DISORDERS Depression    GI/Hepatic negative GI ROS, Neg liver ROS,   Endo/Other  negative endocrine ROS  Renal/GU negative Renal ROS Bladder dysfunction  OAB    Musculoskeletal negative musculoskeletal ROS (+)   Abdominal (+) + obese,   Peds  Hematology  (+) anemia ,   Anesthesia Other Findings   Reproductive/Obstetrics Uterine Fibroids Menorrhagia Pelvic pain                            Anesthesia Physical Anesthesia Plan  ASA: II  Anesthesia Plan: General   Post-op Pain Management:    Induction: Intravenous  Airway Management Planned: Oral ETT  Additional Equipment:   Intra-op Plan:   Post-operative Plan: Extubation in OR  Informed Consent: I have reviewed the patients History and Physical, chart, labs and discussed the procedure including the risks, benefits and alternatives for the proposed anesthesia with the patient or authorized representative who has indicated his/her understanding and acceptance.   Dental advisory given  Plan Discussed with: CRNA, Anesthesiologist and Surgeon  Anesthesia Plan Comments:         Anesthesia Quick Evaluation

## 2015-03-20 NOTE — Op Note (Signed)
OPERATIVE NOTE  Jo Compton  DOB:    04-26-1966  MRN:    [id number]  CSN:    [id number]  Date of Surgery:  03/20/2015  Preoperative Diagnosis:  Fibroid uterus  Menorrhagia  Anemia  Obesity  Abdominal pain  Postoperative Diagnosis:  Same  Pelvic inflammatory disease  Endometriosis  Procedure:  Diagnostic laparoscopy   Total abdominal hysterectomy  Bilateral salpingectomy  Pelvic biopsies  Cystoscopy  Surgeon:  Gildardo Cranker, M.D.  Assistant:  Sullivan Lone, RNFA  Anesthetic:  General  Disposition:  The patient presents with the above diagnosis. Her hemoglobin has been as low as 7. She has been transfused 2 units of blood. Her preoperative hemoglobin was 8.2. The patient has been to the emergency room on multiple occasions. A CT scan recently showed a multi-fibroid uterus. A nodule of uncertain etiology was noted on the bowel. The patient understands the indications for surgical procedure. She accepts the risk of, but not limited to, anesthetic complications, bleeding, infections, and possible damage to the surrounding organs.  Findings:  The uterus weighed 660 g. The uterus contained multiple large fibroids. The fallopian tubes were edematous. The ovaries were normal except for a shaggy exudate that seemed consistent with pelvic inflammatory disease. The same shaggy exudate was present in the anterior and posterior cul-de-sac. The ovaries were adhered to the posterior uterus. "Powder burns" were noted in the anterior cul-de-sac. HE'S were obtained. The bowel appeared normal. A phlebolith was noted in the mesentery of the bowel near where the appendix had been removed. I am not certain if this is the area that was noted on CT scan. The area was biopsied away and sent to pathology. The upper abdomen appeared normal.  Procedure:  The patient was taken to the operating room where a general anesthetic was given. A timeout was performed confirming the  appropriate procedure. The patient's abdomen was prepped with DuraPrep. Her perineum and vagina were prepped with Betadine. An examination under anesthesia was performed. A Hulka tenaculum was placed inside the uterus. The patient was then sterilely draped. The subumbilical area was injected with half percent Marcaine with epinephrine. A subumbilical incision was made and the incision was carried sharply through the subcutaneous tissue, the fascia, and the anterior peritoneum. The Hassan cannula was sutured into place. The laparoscope was inserted. A pneumoperitoneum was obtained. The pelvis was visualized with findings as mentioned above. Pictures were taken. A large uterus was noted to feel the pelvis. An exudate was present that was consistent with pelvic inflammatory disease. The fallopian tubes and ovaries were adhered to the posterior uterus. Blood was noted in the peritoneal cavity and appeared to come from a ruptured corpus luteum. The decision was made to abandon the laparoscopic approach, and proceed with abdominal hysterectomy. The pneumoperitoneum was allowed to escape. The Hassan cannula was removed. The sub-umbilical incision was closed using deep sutures of 0 Vicryl to close the fascia. The Hulka tenaculum was removed from within the uterus. A Foley catheter was placed in the bladder. The patient was sterilely draped. The patient's lower abdomen was injected with 10 cc of half percent Marcaine with epinephrine. A low transverse incision was made in the lower abdomen and carried sharply through the subcutaneous tissue, the fascia, and the anterior peritoneum. The pelvis was explored with findings as mentioned above. The ureters were identified bilaterally, and we felt that we could safely proceed. The uterus was elevated into the operative field. The adhesions between the ovaries, fallopian tubes, and  posterior uterus were removed. The upper pedicles were clamped. The round ligament was identified on  the right. The round ligament was suture ligated and cut. The right adnexa was then isolated, doubly clamped, cut, free tied, and suture ligated. An identical procedure was carried out on the opposite side. The bladder flap was developed anteriorly. Alternating from left to right, the uterine arteries, and  parametrial tissues were clamped, cut, sutured, and tied securely. The fundus of the uterus was transected from the upper cervix to help with visualization. The  paracervical tissues, uterosacral ligaments, and the vaginal angles were clamped, cut, sutured, and tied securely. The cervix was transected from the apex of the vagina. The cervix was removed from the operative field. The vaginal cuff was closed using figure-of-eight sutures. The pelvis was irrigated. Hemostasis was confirmed. The right fallopian tube was identified. The mesosalpinx was clamped and cut. The defect was closed using a figure-of-eight suture. An identical procedure was carried out on the left. Endometriosis was noted in the anterior cul-de-sac. A biopsy was obtained. We then inspected the bowel carefully. A phlebolith was noted in the mesentery of the bowel. This was biopsied away. The defect was closed using a figure-of-eight suture of 0 Vicryl. Care was taken not to damage the bowel. The pelvis was again irrigated. Hemostasis was confirmed. All instruments and packs were removed from the abdominal cavity. The anterior peritoneum and the abdominal musculature were reapproximated in the midline using figure-of-eight sutures. The abdominal musculature and the fascia were irrigated. Hemostasis was adequate. The fascia was closed using a running suture followed by several interrupted sutures. The subcutaneous layer was closed using interrupted sutures. The skin was reapproximated using a subcuticular suture of 3-0 Monocryl. The subumbilical skin was closed using 3-0 Monocryl. The patient was given methylene blue. The Foley catheter was  removed. The cystoscope was inserted. The bladder mucosa appeared normal. Blue dye quickly passed through the ureteral orifices. The cystoscope was removed. A Foley catheter was reinserted. 0 Vicryl is the suture material used except where otherwise mentioned.  Sponge, needle, and instrument counts were correct on 2 occasions. The estimated blood loss for the procedure was 200. The patient tolerated her procedure well. She was awakened from her anesthetic without difficulty. She was transported to the recovery room in stable condition. She was noted to drain clear yellow urine. Pathology specimens included: Uterus and cervix, bilateral fallopian tubes, biopsy of the bladder flap and the anterior peritoneum, and biopsy of the bowel mesentery.   Gildardo Cranker, M.D.  03/20/2015

## 2015-03-20 NOTE — Progress Notes (Signed)
Subjective:  Patient reports rectal pain.    Objective: I have reviewed patient's vital signs and intake and output.  General: alert and no distress Resp: clear to auscultation bilaterally Cardio: regular rate and rhythm, S1, S2 normal, no murmur, click, rub or gallop GI: soft, bs pos Extremities: extremities normal, atraumatic, no cyanosis or edema   Assessment/Plan:  Stable POD 1  Low urine output (30 cc/hour).  Increase IV fluids.   LOS: 0 days    Eli Hose 03/20/2015, 6:35 PM

## 2015-03-20 NOTE — OR Nursing (Signed)
Dr Suezanne Jacquet judd aware of pt blood pressures. Pt assymptomatic . No orders received . Darin Engels rn

## 2015-03-20 NOTE — H&P (Signed)
The patient was interviewed and examined today.  The previously documented history and physical examination was reviewed. There are no changes. The operative procedure was reviewed. The risks and benefits were outlined again. The specific risks include, but are not limited to, anesthetic complications, bleeding, infections, and possible damage to the surrounding organs. The patient's questions were answered.  We are ready to proceed as outlined. The likelihood of the patient achieving the goals of this procedure is very likely.   BP 110/82 mmHg  Pulse 79  Temp(Src) 98.3 F (36.8 C) (Oral)  Resp 16  SpO2 100%  LMP 02/14/2015 (Approximate)  Results for orders placed or performed during the hospital encounter of 03/20/15 (from the past 24 hour(s))  Pregnancy, urine     Status: None   Collection Time: 03/20/15  7:30 AM  Result Value Ref Range   Preg Test, Ur NEGATIVE NEGATIVE  CBC     Status: Abnormal   Collection Time: 03/20/15  7:40 AM  Result Value Ref Range   WBC 6.4 4.0 - 10.5 K/uL   RBC 3.98 3.87 - 5.11 MIL/uL   Hemoglobin 8.6 (L) 12.0 - 15.0 g/dL   HCT 28.5 (L) 36.0 - 46.0 %   MCV 71.6 (L) 78.0 - 100.0 fL   MCH 21.6 (L) 26.0 - 34.0 pg   MCHC 30.2 30.0 - 36.0 g/dL   RDW 18.3 (H) 11.5 - 15.5 %   Platelets 383 150 - 400 K/uL  Type and screen Type and Screen - Anemia     Status: None (Preliminary result)   Collection Time: 03/20/15  7:40 AM  Result Value Ref Range   ABO/RH(D) O POS    Antibody Screen PENDING    Sample Expiration 03/23/2015     Gildardo Cranker, M.D.

## 2015-03-20 NOTE — Anesthesia Procedure Notes (Signed)
Procedure Name: Intubation Date/Time: 03/20/2015 9:09 AM Performed by: Jonna Munro Pre-anesthesia Checklist: Patient identified, Emergency Drugs available, Suction available, Patient being monitored and Timeout performed Patient Re-evaluated:Patient Re-evaluated prior to inductionOxygen Delivery Method: Circle system utilized Preoxygenation: Pre-oxygenation with 100% oxygen Intubation Type: IV induction Ventilation: Mask ventilation without difficulty Laryngoscope Size: Mac and 3 Grade View: Grade II Tube type: Oral Tube size: 7.0 mm Number of attempts: 1 Airway Equipment and Method: Stylet Secured at: 21 cm Tube secured with: Tape Dental Injury: Teeth and Oropharynx as per pre-operative assessment

## 2015-03-21 LAB — CBC
HCT: 22.2 % — ABNORMAL LOW (ref 36.0–46.0)
Hemoglobin: 6.8 g/dL — CL (ref 12.0–15.0)
MCH: 21.9 pg — AB (ref 26.0–34.0)
MCHC: 30.6 g/dL (ref 30.0–36.0)
MCV: 71.6 fL — AB (ref 78.0–100.0)
PLATELETS: 298 10*3/uL (ref 150–400)
RBC: 3.1 MIL/uL — ABNORMAL LOW (ref 3.87–5.11)
RDW: 18.5 % — ABNORMAL HIGH (ref 11.5–15.5)
WBC: 7.1 10*3/uL (ref 4.0–10.5)

## 2015-03-21 MED ORDER — DEXTROSE 5 % IV SOLN
Freq: Three times a day (TID) | INTRAVENOUS | Status: DC
  Filled 2015-03-21 (×2): qty 3.25

## 2015-03-21 MED ORDER — GENTAMICIN SULFATE 40 MG/ML IJ SOLN
Freq: Three times a day (TID) | INTRAVENOUS | Status: DC
  Administered 2015-03-21 – 2015-03-22 (×2): via INTRAVENOUS
  Filled 2015-03-21 (×3): qty 3.25

## 2015-03-21 MED ORDER — SODIUM CHLORIDE 0.9 % IV SOLN
Freq: Once | INTRAVENOUS | Status: AC
  Administered 2015-03-21: 13:00:00 via INTRAVENOUS

## 2015-03-21 NOTE — Addendum Note (Signed)
Addendum  created 03/21/15 0805 by Talbot Grumbling, CRNA   Modules edited: Clinical Notes   Clinical Notes:  File: [id number]

## 2015-03-21 NOTE — Progress Notes (Signed)
  Dr. Raphael Gibney notified at [phone] of critical HgB. No new orders.    Results for RAYVIN, JULIO (MRN [id number]) as of 03/21/2015 06:58  Ref. Range 03/21/2015 05:32  Hemoglobin Latest Ref Range: 12.0-15.0 g/dL 6.8 (LL)

## 2015-03-21 NOTE — Progress Notes (Signed)
Subjective:  Patient reports incisional pain, tolerating PO and no problems voiding.    Objective:  I have reviewed patient's vital signs, intake and output, medications and labs.  General: alert and no distress Resp: clear to auscultation bilaterally Cardio: regular rate and rhythm, S1, S2 normal, no murmur, click, rub or gallop GI: soft, non-tender; bowel sounds normal; no masses,  no organomegaly Extremities: extremities normal, atraumatic, no cyanosis or edema Vaginal Bleeding: minimal  CBC    Component Value Date/Time   WBC 7.1 03/21/2015 0532   RBC 3.10* 03/21/2015 0532   HGB 6.8* 03/21/2015 0532   HCT 22.2* 03/21/2015 0532   PLT 298 03/21/2015 0532   MCV 71.6* 03/21/2015 0532   MCH 21.9* 03/21/2015 0532   MCHC 30.6 03/21/2015 0532   RDW 18.5* 03/21/2015 0532   LYMPHSABS 1.1 03/17/2015 0920   MONOABS 0.5 03/17/2015 0920   EOSABS 0.2 03/17/2015 0920   BASOSABS 0.0 03/17/2015 0920   Assessment/Plan:  Doing well S/P TAH  Anemia  Not orthostatic, but significant anemia. Does not tolerate iron well. TXN offered. R and B reviewed. Questions answered. The patient agrees. Will give two units.  Continue antibiotics because of PID.   Plan discharge tomorrow.   LOS: 1 day    Samie Reasons V 03/21/2015, 12:24 PM

## 2015-03-21 NOTE — Progress Notes (Signed)
Patient complaining of frequency of unrination.  Bladder scan done, the volume = 0, after voiding 150+cc clear light green urine.  Patient stated passing flatus and had 3 small formed BMs after Dulcolax suppository given at 2013.

## 2015-03-21 NOTE — Anesthesia Postprocedure Evaluation (Signed)
Anesthesia Post Note  Patient: Jo Compton  Procedure(s) Performed: Procedure(s) (LRB): HYSTERECTOMY ABDOMINAL, EXCISION MESENTERIC NODULE, BLADDER FLAP BIOPSY (Bilateral) LAPAROSCOPY DIAGNOSTIC (N/A) BILATERAL SALPINGECTOMY (Bilateral) CYSTOSCOPY (N/A)  Patient location during evaluation: [facility] Anesthesia Type: General Level of consciousness: awake and alert and oriented Pain management: pain level controlled Vital Signs Assessment: post-procedure vital signs reviewed and stable Respiratory status: spontaneous breathing Cardiovascular status: stable Postop Assessment: Adequate PO intake and No signs of nausea or vomiting Anesthetic complications: no    Last Vitals:  Filed Vitals:   03/21/15 0115 03/21/15 0601  BP: 121/76   Pulse: 67   Temp: 37.4 C 37.3 C  Resp: 16 21    Last Pain:  Filed Vitals:   03/21/15 0607  PainSc: 4                  [name]

## 2015-03-22 LAB — TYPE AND SCREEN
ABO/RH(D): O POS
Antibody Screen: NEGATIVE
UNIT DIVISION: 0
UNIT DIVISION: 0
Unit division: 0
Unit division: 0

## 2015-03-22 LAB — CBC
HCT: 28.2 % — ABNORMAL LOW (ref 36.0–46.0)
Hemoglobin: 8.8 g/dL — ABNORMAL LOW (ref 12.0–15.0)
MCH: 23 pg — AB (ref 26.0–34.0)
MCHC: 31.2 g/dL (ref 30.0–36.0)
MCV: 73.6 fL — AB (ref 78.0–100.0)
PLATELETS: 301 10*3/uL (ref 150–400)
RBC: 3.83 MIL/uL — ABNORMAL LOW (ref 3.87–5.11)
RDW: 18.6 % — AB (ref 11.5–15.5)
WBC: 8.5 10*3/uL (ref 4.0–10.5)

## 2015-03-22 MED ORDER — OXYCODONE-ACETAMINOPHEN 5-325 MG PO TABS: 1.0000 | ORAL_TABLET | ORAL | Status: DC | PRN

## 2015-03-22 MED ORDER — PROMETHAZINE HCL 12.5 MG PO TABS: 12.5000 mg | ORAL_TABLET | Freq: Four times a day (QID) | ORAL | Status: DC | PRN

## 2015-03-22 MED ORDER — IBUPROFEN 800 MG PO TABS: 800.0000 mg | ORAL_TABLET | Freq: Three times a day (TID) | ORAL | Status: DC | PRN

## 2015-03-22 MED ORDER — DOCUSATE SODIUM 100 MG PO CAPS: 100.0000 mg | ORAL_CAPSULE | Freq: Two times a day (BID) | ORAL | Status: DC

## 2015-03-22 MED ORDER — DOXYCYCLINE HYCLATE 100 MG PO CAPS: 100.0000 mg | ORAL_CAPSULE | Freq: Two times a day (BID) | ORAL | Status: DC

## 2015-03-22 NOTE — Progress Notes (Addendum)
Patient had 5 small, formed stools during night after receiving suppository and passing flatus. After bladder scan, patient has been voiding 400-500cc each void since.

## 2015-03-22 NOTE — Discharge Instructions (Signed)
Abdominal Hysterectomy, Care After °These instructions give you information on caring for yourself after your procedure. Your doctor may also give you more specific instructions. Call your doctor if you have any problems or questions after your procedure.  °HOME CARE °It takes 4-6 weeks to recover from this surgery. Follow all of your doctor's instructions.  °· Only take medicines as told by your doctor. °· Change your bandage as told by your doctor. °· Return to your doctor to have your stitches taken out. °· Take showers for 2-3 weeks. Ask your doctor when it is okay to shower. °· Do not douche, use tampons, or have sex (intercourse) for at least 6 weeks or as told. °· Follow your doctor's advice about exercise, lifting objects, driving, and general activities. °· Get plenty of rest and sleep. °· Do not lift anything heavier than a gallon of milk (about 10 pounds [4.5 kilograms]) for the first month after surgery. °· Get back to your normal diet as told by your doctor. °· Do not drink alcohol until your doctor says it is okay. °· Take a medicine to help you poop (laxative) as told by your doctor. °· Eating foods high in fiber may help you poop. Eat a lot of raw fruits and vegetables, whole grains, and beans. °· Drink enough fluids to keep your pee (urine) clear or pale yellow. °· Have someone help you at home for 1-2 weeks after your surgery. °· Keep follow-up doctor visits as told. °GET HELP IF: °· You have chills or fever. °· You have puffiness, redness, or pain in area of the cut (incision). °· You have yellowish-white fluid (pus) coming from the cut. °· You have a bad smell coming from the cut or bandage. °· Your cut pulls apart. °· You feel dizzy or light-headed. °· You have pain or bleeding when you pee. °· You keep having watery poop (diarrhea). °· You keep feeling sick to your stomach (nauseous) or keep throwing up (vomiting). °· You have fluid (discharge) coming from your vagina. °· You have a  rash. °· You have a reaction to your medicine. °· You need stronger pain medicine. °GET HELP RIGHT AWAY IF:  °· You have a fever and your symptoms suddenly get worse. °· You have bad belly (abdominal) pain. °· You have chest pain. °· You are short of breath. °· You pass out (faint). °· You have pain, puffiness, or redness of your leg. °· You bleed a lot from your vagina and notice clumps of tissue (clots). °MAKE SURE YOU:  °· Understand these instructions. °· Will watch your condition. °· Will get help right away if you are not doing well or get worse. °  °This information is not intended to replace advice given to you by your health care provider. Make sure you discuss any questions you have with your health care provider. °  °Document Released: 01/21/2008 Document Revised: 04/18/2013 Document Reviewed: 02/03/2013 °Elsevier Interactive Patient Education ©2016 [employer] ° °

## 2015-03-22 NOTE — Progress Notes (Signed)
Patient discharged home with significant other... Discharge instructions reviewed with patient and she verbalized understanding... Condition stable... No equipment... Taken to car via wheelchair by E. Sahalie Beth, RN.  

## 2015-03-22 NOTE — Discharge Summary (Signed)
Physician Discharge Summary   Patient ID: Jo Compton MRN: [id number] DOB/AGE: 47/22/1967 47 y.o.  Admit date:         03/20/2015 Discharge date: 03/22/2015  Admission Diagnoses:  Pelvic Pain, Menorrhagia, Uterine Fibroids, anemia, overweight, bladder spasms  Discharge Diagnoses:   Pelvic Pain, Menorrhagia, Uterine Fibroids  Anemia, overweight, bladder spasms, pelvic inflammatory disease, endometriosis  Procedures this Admission:  03/20/2015  Procedure(s) (LRB): HYSTERECTOMY ABDOMINAL, EXCISION MESENTERIC NODULE, BLADDER FLAP BIOPSY (Bilateral) LAPAROSCOPY DIAGNOSTIC (N/A) BILATERAL SALPINGECTOMY (Bilateral) CYSTOSCOPY (N/A)  Discharged Condition: good   Admission Hx and PE: The patient has been followed at the [facility]. She has a history of  Pelvic Pain, Menorrhagia, Uterine Fibroids. Please see her documented history and physical exam. The patient was transfused 2 units of blood prior to her surgery. Her preoperative hemoglobin was 8.6. A CT scan was performed that showed a fibroid uterus. No other pathology was seen. There was a nodule of uncertain etiology near the bowel where she had had a prior appendectomy. The patient has had hysteroscopy and D&C in the past. Her pathology report was benign. Medical and outpatient therapy have been insufficient to relieve her discomfort. She now wants to proceed with definitive therapy.  Today's exam:  BP 99/62 mmHg  Pulse 78  Temp(Src) 98.6 F (37 C) (Oral)  Resp 16  Ht 5\' 8"  (1.727 m)  Wt 190 lb (86.183 kg)  BMI 28.90 kg/m2  SpO2 99%  LMP 02/14/2015 (Approximate)  Chest: Clear Heart: Regular rate and rhythm Abdomen: Soft, appropriately tender. Incision is clean and dry. Bowel sounds are positive. Extremities: No masses, no signs of thrombosis  Hospital course:  On the day of admission, the patient underwent the following Procedure(s):  The operative procedure  began with a diagnostic laparoscopy. The patient was found to have a large bulbous uterus that was not easily assessable by laparoscopy. In addition the patient was found to have pelvic inflammatory disease. The decision was made to proceed with the abdominal approach. The patient then had an abdominal hysterectomy and bilateral salpingectomy. Endometriosis was noted in the anterior cul-de-sac. A biopsy was obtained. A mesenteric nodule was seen that was consistent with a phlebolith. This was biopsied. The patient tolerated her operative procedure well. Her postoperative course was complicated by a hemoglobin of 6.8. The patient was largely asymptomatic, however I recommended blood transfusion to aid in the healing and recovery process. The patient received 2 units of packed red blood cells. Her post transfusion hemoglobin was 8.8. The patient remained afebrile throughout her stay. The patient was given 48 hours of gentamicin and clindamycin because of the operative finding of pelvic inflammatory disease. She tolerated a regular diet. The patient had a bowel movement prior to discharge. She was discharged home on postoperative day #2 in good condition.  Labs:  HEMOGLOBIN  Date Value Ref Range Status  03/22/2015 8.8* 12.0 - 15.0 g/dL Final    Comment:    DELTA CHECK NOTED REPEATED TO VERIFY POST TRANSFUSION SPECIMEN    HCT  Date Value Ref Range Status  03/22/2015 28.2* 36.0 - 46.0 % Final    Consults: None  Final pathology report: Pending at the time of discharge  Disposition:  The patient will be discharged to home. She has been given a copy of the discharge instructions as prepared by the [facility] for patients who have undergone the Procedure(s): HYSTERECTOMY ABDOMINAL, EXCISION MESENTERIC NODULE, BLADDER FLAP BIOPSY LAPAROSCOPY DIAGNOSTIC  BILATERAL SALPINGECTOMY CYSTOSCOPY.      Medication List    STOP taking these medications        escitalopram 10 MG  tablet  Commonly known as:  LEXAPRO     furosemide 20 MG tablet  Commonly known as:  LASIX     HYDROcodone-acetaminophen 5-325 MG tablet  Commonly known as:  NORCO/VICODIN     traMADol 50 MG tablet  Commonly known as:  ULTRAM      TAKE these medications        docusate sodium 100 MG capsule  Commonly known as:  COLACE  Take 1 capsule (100 mg total) by mouth 2 (two) times daily.     doxycycline 100 MG capsule  Commonly known as:  VIBRAMYCIN  Take 1 capsule (100 mg total) by mouth 2 (two) times daily.     FERRALET 90 90-1 MG Tabs  Take 1 tablet by mouth daily.     ibuprofen 800 MG tablet  Commonly known as:  ADVIL,MOTRIN  Take 1 tablet (800 mg total) by mouth every 8 (eight) hours as needed.     MYRBETRIQ 50 MG Tb24 tablet  Generic drug:  mirabegron ER  Take 50 mg by mouth daily.     oxyCODONE-acetaminophen 5-325 MG tablet  Commonly known as:  ROXICET  Take 1 tablet by mouth every 4 (four) hours as needed for severe pain.     promethazine 12.5 MG tablet  Commonly known as:  PHENERGAN  Take 1 tablet (12.5 mg total) by mouth every 6 (six) hours as needed for nausea or vomiting.           Follow-up Information    Follow up with Eli Hose, MD In 6 weeks.   Specialty:  Obstetrics and Gynecology   Contact information:   [address] [place] [address] [phone]       Signed: Eli Hose 03/22/2015, 8:33 AM

## 2015-03-25 ENCOUNTER — Encounter (HOSPITAL_COMMUNITY): Payer: Self-pay | Admitting: Obstetrics and Gynecology

## 2015-04-30 ENCOUNTER — Ambulatory Visit (INDEPENDENT_AMBULATORY_CARE_PROVIDER_SITE_OTHER): Payer: No Typology Code available for payment source | Admitting: Family Medicine

## 2015-04-30 ENCOUNTER — Encounter: Payer: Self-pay | Admitting: Family Medicine

## 2015-04-30 VITALS — BP 113/81 | HR 87 | Temp 98.3°F | Ht 68.0 in | Wt 189.2 lb

## 2015-04-30 DIAGNOSIS — D509 Iron deficiency anemia, unspecified: Secondary | ICD-10-CM | POA: Diagnosis not present

## 2015-04-30 DIAGNOSIS — D62 Acute posthemorrhagic anemia: Secondary | ICD-10-CM

## 2015-04-30 LAB — CBC WITH DIFFERENTIAL/PLATELET
BASOS ABS: 0 10*3/uL (ref 0.0–0.1)
BASOS PCT: 0.4 % (ref 0.0–3.0)
EOS ABS: 0.1 10*3/uL (ref 0.0–0.7)
Eosinophils Relative: 1.1 % (ref 0.0–5.0)
HEMATOCRIT: 38 % (ref 36.0–46.0)
HEMOGLOBIN: 12 g/dL (ref 12.0–15.0)
LYMPHS PCT: 28.7 % (ref 12.0–46.0)
Lymphs Abs: 1.5 10*3/uL (ref 0.7–4.0)
MCHC: 31.6 g/dL (ref 30.0–36.0)
MCV: 72.2 fl — ABNORMAL LOW (ref 78.0–100.0)
Monocytes Absolute: 0.4 10*3/uL (ref 0.1–1.0)
Monocytes Relative: 8.2 % (ref 3.0–12.0)
Neutro Abs: 3.2 10*3/uL (ref 1.4–7.7)
Neutrophils Relative %: 61.6 % (ref 43.0–77.0)
Platelets: 289 10*3/uL (ref 150.0–400.0)
RBC: 5.27 Mil/uL — ABNORMAL HIGH (ref 3.87–5.11)
RDW: 22 % — ABNORMAL HIGH (ref 11.5–15.5)
WBC: 5.2 10*3/uL (ref 4.0–10.5)

## 2015-04-30 LAB — IBC PANEL
Iron: 87 ug/dL (ref 42–145)
SATURATION RATIOS: 19.9 % — AB (ref 20.0–50.0)
TRANSFERRIN: 312 mg/dL (ref 212.0–360.0)

## 2015-04-30 LAB — FERRITIN: Ferritin: 11.5 ng/mL (ref 10.0–291.0)

## 2015-04-30 MED ORDER — FERRALET 90 90-1 MG PO TABS: ORAL_TABLET | ORAL | Status: DC

## 2015-04-30 NOTE — Patient Instructions (Signed)
Iron Deficiency Anemia, Adult Anemia is a condition in which there are less red blood cells or hemoglobin in the blood than normal. Hemoglobin is the part of red blood cells that carries oxygen. Iron deficiency anemia is anemia caused by too little iron. It is the most common type of anemia. It may leave you tired and short of breath. CAUSES   Lack of iron in the diet.  Poor absorption of iron, as seen with intestinal disorders.  Intestinal bleeding.  Heavy periods. SIGNS AND SYMPTOMS  Mild anemia may not be noticeable. Symptoms may include:  Fatigue.  Headache.  Pale skin.  Weakness.  Tiredness.  Shortness of breath.  Dizziness.  Cold hands and feet.  Fast or irregular heartbeat. DIAGNOSIS  Diagnosis requires a thorough evaluation and physical exam by your health care provider. Blood tests are generally used to confirm iron deficiency anemia. Additional tests may be done to find the underlying cause of your anemia. These may include:  Testing for blood in the stool (fecal occult blood test).  A procedure to see inside the colon and rectum (colonoscopy).  A procedure to see inside the esophagus and stomach (endoscopy). TREATMENT  Iron deficiency anemia is treated by correcting the cause of the deficiency. Treatment may involve:  Adding iron-rich foods to your diet.  Taking iron supplements. Pregnant or breastfeeding women need to take extra iron because their normal diet usually does not provide the required amount.  Taking vitamins. Vitamin C improves the absorption of iron. Your health care provider may recommend that you take your iron tablets with a glass of orange juice or vitamin C supplement.  Medicines to make heavy menstrual flow lighter.  Surgery. HOME CARE INSTRUCTIONS   Take iron as directed by your health care provider.  If you cannot tolerate taking iron supplements by mouth, talk to your health care provider about taking them through a vein  (intravenously) or an injection into a muscle.  For the best iron absorption, iron supplements should be taken on an empty stomach. If you cannot tolerate them on an empty stomach, you may need to take them with food.  Do not drink milk or take antacids at the same time as your iron supplements. Milk and antacids may interfere with the absorption of iron.  Iron supplements can cause constipation. Make sure to include fiber in your diet to prevent constipation. A stool softener may also be recommended.  Take vitamins as directed by your health care provider.  Eat a diet rich in iron. Foods high in iron include liver, lean beef, whole-grain bread, eggs, dried fruit, and dark green leafy vegetables. SEEK IMMEDIATE MEDICAL CARE IF:   You faint. If this happens, do not drive. Call your local emergency services (911 in [place] if no other help is available.  You have chest pain.  You feel nauseous or vomit.  You have severe or increased shortness of breath with activity.  You feel weak.  You have a rapid heartbeat.  You have unexplained sweating.  You become light-headed when getting up from a chair or bed. MAKE SURE YOU:   Understand these instructions.  Will watch your condition.  Will get help right away if you are not doing well or get worse.   This information is not intended to replace advice given to you by your health care provider. Make sure you discuss any questions you have with your health care provider.   Document Released: 04/10/2000 Document Revised: 05/04/2014 Document Reviewed: 12/19/2012 Elsevier   Interactive Patient Education 2016 Elsevier Inc.  

## 2015-04-30 NOTE — Progress Notes (Signed)
Pre visit review using our clinic review tool, if applicable. No additional management support is needed unless otherwise documented below in the visit note. 

## 2015-04-30 NOTE — Progress Notes (Signed)
Subjective:    Patient ID: Jo Compton, female    DOB: [birth year], 50 y.o.   MRN: [id number]  Chief Complaint  Patient presents with  . Fatigue    s/p hysterectomy    HPI Patient is in today for f/u from [facility].  She had 2 transfusions and was put on iron.  She is still tired and her hgb has not been checked in a while.  She doe admit to not taking her iron like she is supposed to.    Past Medical History  Diagnosis Date  . Rectal bleeding   . OAB (overactive bladder)   . Depression   . Anemia   . Fistula     rectal    Past Surgical History  Procedure Laterality Date  . Appendectomy    . Colonoscopy    . Abdominal hysterectomy Bilateral 03/20/2015    Procedure: HYSTERECTOMY ABDOMINAL, EXCISION MESENTERIC NODULE, BLADDER FLAP BIOPSY;  Surgeon: Ena Dawley, MD;  Location: [facility];  Service: Gynecology;  Laterality: Bilateral;  . Laparoscopy N/A 03/20/2015    Procedure: LAPAROSCOPY DIAGNOSTIC;  Surgeon: Ena Dawley, MD;  Location: [facility];  Service: Gynecology;  Laterality: N/A;  . Bilateral salpingectomy Bilateral 03/20/2015    Procedure: BILATERAL SALPINGECTOMY;  Surgeon: Ena Dawley, MD;  Location: [facility];  Service: Gynecology;  Laterality: Bilateral;  . Cystoscopy N/A 03/20/2015    Procedure: CYSTOSCOPY;  Surgeon: Ena Dawley, MD;  Location: [facility];  Service: Gynecology;  Laterality: N/A;    Family History  Problem Relation Age of Onset  . Hypertension Mother   . COPD Mother   . Hypertension Maternal Aunt   . Cancer Maternal Uncle     colon  . Cancer Maternal Uncle     liver    Social History   Social History  . Marital Status: Married    Spouse Name: N/A  . Number of Children: N/A  . Years of Education: N/A   Occupational History  . Land at [employer]    Social History Main Topics  . Smoking status: Never Smoker   . Smokeless tobacco: Never Used  . Alcohol Use: 0.6 oz/week    1 Glasses of wine per week  . Drug Use: No   . Sexual Activity: Yes    Birth Control/ Protection: None   Other Topics Concern  . Not on file   Social History Narrative   ** Merged History Encounter **        Outpatient Prescriptions Prior to Visit  Medication Sig Dispense Refill  . docusate sodium (COLACE) 100 MG capsule Take 1 capsule (100 mg total) by mouth 2 (two) times daily. 120 capsule 2  . ibuprofen (ADVIL,MOTRIN) 800 MG tablet Take 1 tablet (800 mg total) by mouth every 8 (eight) hours as needed. 50 tablet 1  . mirabegron ER (MYRBETRIQ) 50 MG TB24 tablet Take 50 mg by mouth daily.    Marland Kitchen doxycycline (VIBRAMYCIN) 100 MG capsule Take 1 capsule (100 mg total) by mouth 2 (two) times daily. 28 capsule 0  . Fe Cbn-Fe Gluc-FA-B12-C-DSS (FERRALET 90) 90-1 MG TABS Take 1 tablet by mouth daily. (Patient not taking: Reported on 04/30/2015) 30 each 3  . oxyCODONE-acetaminophen (ROXICET) 5-325 MG tablet Take 1 tablet by mouth every 4 (four) hours as needed for severe pain. 50 tablet 0  . promethazine (PHENERGAN) 12.5 MG tablet Take 1 tablet (12.5 mg total) by mouth every 6 (six) hours as needed for nausea or vomiting. 30 tablet 0  No facility-administered medications prior to visit.    No Known Allergies  Review of Systems  Constitutional: Positive for malaise/fatigue. Negative for fever and chills.  HENT: Negative for congestion and hearing loss.   Eyes: Negative for discharge.  Respiratory: Negative for cough, sputum production and shortness of breath.   Cardiovascular: Negative for chest pain, palpitations and leg swelling.  Gastrointestinal: Negative for heartburn, nausea, vomiting, abdominal pain, diarrhea, constipation and blood in stool.  Genitourinary: Negative for dysuria, urgency, frequency and hematuria.  Musculoskeletal: Negative for myalgias, back pain and falls.  Skin: Negative for rash.  Neurological: Negative for dizziness, sensory change, loss of consciousness, weakness and headaches.  Endo/Heme/Allergies:  Negative for environmental allergies. Does not bruise/bleed easily.  Psychiatric/Behavioral: Negative for depression and suicidal ideas. The patient is not nervous/anxious and does not have insomnia.        Objective:    Physical Exam  Constitutional: She appears well-developed and well-nourished. No distress.  Psychiatric: She has a normal mood and affect. Her behavior is normal. Judgment and thought content normal.  Nursing note and vitals reviewed.   BP 113/81 mmHg  Pulse 87  Temp(Src) 98.3 F (36.8 C) (Oral)  Ht 5\' 8"  (1.727 m)  Wt 189 lb 3.2 oz (85.821 kg)  BMI 28.77 kg/m2  SpO2 99%  LMP 02/14/2015 (Approximate) Wt Readings from Last 3 Encounters:  04/30/15 189 lb 3.2 oz (85.821 kg)  03/20/15 190 lb (86.183 kg)  03/17/15 190 lb (86.183 kg)     Lab Results  Component Value Date   WBC 8.5 03/22/2015   HGB 8.8* 03/22/2015   HCT 28.2* 03/22/2015   PLT 301 03/22/2015   GLUCOSE 95 03/17/2015   CHOL 160 06/07/2012   TRIG 101.0 06/07/2012   HDL 49.10 06/07/2012   LDLCALC 91 06/07/2012   ALT 10* 03/17/2015   AST 15 03/17/2015   NA 137 03/17/2015   K 4.1 03/17/2015   CL 108 03/17/2015   CREATININE 0.71 03/20/2015   BUN 6 03/17/2015   CO2 25 03/17/2015   TSH 0.713 12/16/2012   HGBA1C 5.5 01/09/2013   MICROALBUR 0.1 06/07/2012    Lab Results  Component Value Date   TSH 0.713 12/16/2012   Lab Results  Component Value Date   WBC 8.5 03/22/2015   HGB 8.8* 03/22/2015   HCT 28.2* 03/22/2015   MCV 73.6* 03/22/2015   PLT 301 03/22/2015   Lab Results  Component Value Date   NA 137 03/17/2015   K 4.1 03/17/2015   CO2 25 03/17/2015   GLUCOSE 95 03/17/2015   BUN 6 03/17/2015   CREATININE 0.71 03/20/2015   BILITOT 0.4 03/17/2015   ALKPHOS 42 03/17/2015   AST 15 03/17/2015   ALT 10* 03/17/2015   PROT 6.7 03/17/2015   ALBUMIN 3.3* 03/17/2015   CALCIUM 9.0 03/17/2015   ANIONGAP 4* 03/17/2015   GFR 112.26 08/03/2013   Lab Results  Component Value Date    CHOL 160 06/07/2012   Lab Results  Component Value Date   HDL 49.10 06/07/2012   Lab Results  Component Value Date   LDLCALC 91 06/07/2012   Lab Results  Component Value Date   TRIG 101.0 06/07/2012   Lab Results  Component Value Date   CHOLHDL 3 06/07/2012   Lab Results  Component Value Date   HGBA1C 5.5 01/09/2013       Assessment & Plan:   Problem List Items Addressed This Visit    Anemia, iron deficiency    Check  cbc stat Pt will start iron again       Relevant Medications   Fe Cbn-Fe Gluc-FA-B12-C-DSS (FERRALET 90) 90-1 MG TABS    Other Visit Diagnoses    Acute blood loss anemia    -  Primary    Relevant Medications    Fe Cbn-Fe Gluc-FA-B12-C-DSS (FERRALET 90) 90-1 MG TABS    Other Relevant Orders    CBC with Differential/Platelet    IBC panel    Ferritin       I have discontinued Ms. Powell's oxyCODONE-acetaminophen, promethazine, and doxycycline. I am also having her maintain her mirabegron ER, ibuprofen, docusate sodium, and FERRALET 90.  Meds ordered this encounter  Medications  . Fe Cbn-Fe Gluc-FA-B12-C-DSS (FERRALET 90) 90-1 MG TABS    Sig: Take 1 tablet by mouth daily.    Dispense:  30 each    Refill:  [place], DO

## 2015-04-30 NOTE — Assessment & Plan Note (Signed)
Check cbc stat Pt will start iron again

## 2016-01-12 ENCOUNTER — Encounter (HOSPITAL_BASED_OUTPATIENT_CLINIC_OR_DEPARTMENT_OTHER): Payer: Self-pay | Admitting: Emergency Medicine

## 2016-01-12 ENCOUNTER — Emergency Department (HOSPITAL_BASED_OUTPATIENT_CLINIC_OR_DEPARTMENT_OTHER)
Admission: EM | Admit: 2016-01-12 | Discharge: 2016-01-12 | Disposition: A | Discharge status: Home / Self Care | Payer: BLUE CROSS/BLUE SHIELD | Attending: Emergency Medicine | Admitting: Emergency Medicine

## 2016-01-12 DIAGNOSIS — M545 Low back pain: Secondary | ICD-10-CM | POA: Insufficient documentation

## 2016-01-12 DIAGNOSIS — Y92410 Unspecified street and highway as the place of occurrence of the external cause: Secondary | ICD-10-CM | POA: Insufficient documentation

## 2016-01-12 DIAGNOSIS — Y999 Unspecified external cause status: Secondary | ICD-10-CM | POA: Diagnosis not present

## 2016-01-12 DIAGNOSIS — I10 Essential (primary) hypertension: Secondary | ICD-10-CM | POA: Diagnosis not present

## 2016-01-12 DIAGNOSIS — Y939 Activity, unspecified: Secondary | ICD-10-CM | POA: Insufficient documentation

## 2016-01-12 DIAGNOSIS — M549 Dorsalgia, unspecified: Secondary | ICD-10-CM

## 2016-01-12 DIAGNOSIS — Z79899 Other long term (current) drug therapy: Secondary | ICD-10-CM | POA: Insufficient documentation

## 2016-01-12 DIAGNOSIS — M542 Cervicalgia: Secondary | ICD-10-CM | POA: Diagnosis not present

## 2016-01-12 MED ORDER — METHOCARBAMOL 500 MG PO TABS: 500.0000 mg | ORAL_TABLET | Freq: Two times a day (BID) | ORAL | 0 refills | Status: DC

## 2016-01-12 MED ORDER — NAPROXEN 250 MG PO TABS
500.0000 mg | ORAL_TABLET | Freq: Once | ORAL | Status: AC
  Administered 2016-01-12: 500 mg via ORAL
  Filled 2016-01-12: qty 2

## 2016-01-12 MED ORDER — HYDROCODONE-ACETAMINOPHEN 5-325 MG PO TABS
1.0000 | ORAL_TABLET | Freq: Once | ORAL | Status: AC
  Administered 2016-01-12: 1 via ORAL
  Filled 2016-01-12: qty 1

## 2016-01-12 MED ORDER — HYDROCODONE-ACETAMINOPHEN 5-325 MG PO TABS: 1.0000 | ORAL_TABLET | ORAL | 0 refills | Status: DC | PRN

## 2016-01-12 MED ORDER — METHOCARBAMOL 500 MG PO TABS
500.0000 mg | ORAL_TABLET | Freq: Once | ORAL | Status: AC
  Administered 2016-01-12: 500 mg via ORAL
  Filled 2016-01-12: qty 1

## 2016-01-12 MED ORDER — NAPROXEN 500 MG PO TABS
500.0000 mg | ORAL_TABLET | Freq: Two times a day (BID) | ORAL | 0 refills | Status: DC
Start: 2016-01-12 — End: 2016-06-16
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
## 2016-01-12 NOTE — ED Notes (Signed)
Pt given d/c instructions as per chart. Rx x 3. Verbalizes understanding. No questions. 
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
## 2016-05-20 ENCOUNTER — Telehealth: Payer: Self-pay

## 2016-05-20 ENCOUNTER — Telehealth: Payer: Self-pay | Admitting: Family Medicine

## 2016-05-20 NOTE — Telephone Encounter (Signed)
See other telephone note.  

## 2016-05-20 NOTE — Telephone Encounter (Signed)
Patient Name: Jo Compton  DOB: February 18, 1966    Initial Comment pain in right leg and foot when trying to walk, sometimes pain in left foot, also been having headaches at night, sometimes BP lower number is high   Nurse Assessment  Nurse: Leilani Merl, RN, Heather Date/Time (Eastern Time): 05/20/2016 10:29:46 AM  Confirm and document reason for call. If symptomatic, describe symptoms. ---Caller states that she is having pain in right leg for a while but has gotten worse in the last couple of months and foot when trying to walk, sometimes pain in left foot, also been having headaches at night for the last few weeks, sometimes BP lower number is high, in the 90's, BP this morning was 176/83  Does the patient have any new or worsening symptoms? ---Yes  Will a triage be completed? ---Yes  Related visit to physician within the last 2 weeks? ---No  Does the PT have any chronic conditions? (i.e. diabetes, asthma, etc.) ---Yes  List chronic conditions. ---See MR  Is the patient pregnant or possibly pregnant? (Ask all females between the ages of 40-55) ---No  Is this a behavioral health or substance abuse call? ---No     Guidelines    Guideline Title Affirmed Question Affirmed Notes  Leg Pain [1] Thigh or calf pain AND [2] only 1 side AND [3] present > 1 hour    Final Disposition User   See Physician within 4 Hours (or PCP triage) Leilani Merl, RN, Heather    Comments  Caller states that she cannot be seen at the office until tomorrow afternoon, she wants an appt then.   Referrals  GO TO FACILITY REFUSED   Disagree/Comply: Disagree  Disagree/Comply Reason: Disagree with instructions

## 2016-05-20 NOTE — Telephone Encounter (Signed)
Follow up call made to patient regarding [facility] call she made on today about leg pain. Patient states this is a problem she has had a long time and has discussed with Dr. Carollee Herter. Patient states she can wait because she only wants to see Dr. Etter Sjogren  Appointment scheduled for patient. States she has had some new issues she would also like to talk to Dr. Etter Sjogren about. Patient agreed to go to ER or UC if she has any worsening pain. Appointment scheduled with Dr. Carollee Herter

## 2016-06-16 ENCOUNTER — Ambulatory Visit (HOSPITAL_BASED_OUTPATIENT_CLINIC_OR_DEPARTMENT_OTHER)
Admission: RE | Admit: 2016-06-16 | Discharge: 2016-06-16 | Disposition: A | Discharge status: Home / Self Care | Payer: BLUE CROSS/BLUE SHIELD | Source: Ambulatory Visit | Attending: Family Medicine | Admitting: Family Medicine

## 2016-06-16 ENCOUNTER — Ambulatory Visit (INDEPENDENT_AMBULATORY_CARE_PROVIDER_SITE_OTHER): Payer: BLUE CROSS/BLUE SHIELD | Admitting: Family Medicine

## 2016-06-16 ENCOUNTER — Encounter: Payer: Self-pay | Admitting: Family Medicine

## 2016-06-16 VITALS — BP 112/78 | HR 72 | Temp 98.6°F | Resp 16 | Ht 67.0 in | Wt 207.4 lb

## 2016-06-16 DIAGNOSIS — R200 Anesthesia of skin: Secondary | ICD-10-CM

## 2016-06-16 DIAGNOSIS — R202 Paresthesia of skin: Secondary | ICD-10-CM | POA: Insufficient documentation

## 2016-06-16 MED ORDER — PREGABALIN 50 MG PO CAPS: 50.0000 mg | ORAL_CAPSULE | Freq: Three times a day (TID) | ORAL | 0 refills | Status: DC

## 2016-06-16 NOTE — Progress Notes (Signed)
Subjective:    Patient ID: Jo Compton, female    DOB: 06/12/1965, 51 y.o.   MRN: [id number]  Chief Complaint  Patient presents with  . Leg Pain    Leg Pain   The incident occurred more than 1 week ago. The pain is present in the right leg. The quality of the pain is described as burning and cramping. The pain is at a severity of 8/10. The pain has been intermittent since onset. She has tried NSAIDs for the symptoms. The treatment provided mild relief.      Past Medical History:  Diagnosis Date  . Anemia   . Depression   . Fistula    rectal  . OAB (overactive bladder)   . Rectal bleeding     Past Surgical History:  Procedure Laterality Date  . ABDOMINAL HYSTERECTOMY Bilateral 03/20/2015   Procedure: HYSTERECTOMY ABDOMINAL, EXCISION MESENTERIC NODULE, BLADDER FLAP BIOPSY;  Surgeon: Ena Dawley, MD;  Location: [facility];  Service: Gynecology;  Laterality: Bilateral;  . APPENDECTOMY    . BILATERAL SALPINGECTOMY Bilateral 03/20/2015   Procedure: BILATERAL SALPINGECTOMY;  Surgeon: Ena Dawley, MD;  Location: [facility];  Service: Gynecology;  Laterality: Bilateral;  . COLONOSCOPY    . CYSTOSCOPY N/A 03/20/2015   Procedure: CYSTOSCOPY;  Surgeon: Ena Dawley, MD;  Location: [facility];  Service: Gynecology;  Laterality: N/A;  . LAPAROSCOPY N/A 03/20/2015   Procedure: LAPAROSCOPY DIAGNOSTIC;  Surgeon: Ena Dawley, MD;  Location: [facility];  Service: Gynecology;  Laterality: N/A;    Family History  Problem Relation Age of Onset  . Hypertension Mother   . COPD Mother   . Cancer Maternal Uncle     colon  . Cancer Maternal Uncle     liver  . Hypertension Maternal Aunt   . Diabetes Maternal Aunt     Social History   Social History  . Marital status: Divorced    Spouse name: N/A  . Number of children: N/A  . Years of education: N/A   Occupational History  . Land at [employer]    Social History Main Topics  . Smoking status: Never Smoker  .  Smokeless tobacco: Never Used  . Alcohol use 0.6 oz/week    1 Glasses of wine per week  . Drug use: No  . Sexual activity: Yes    Birth control/ protection: None   Other Topics Concern  . Not on file   Social History Narrative   ** Merged History Encounter **        Outpatient Medications Prior to Visit  Medication Sig Dispense Refill  . docusate sodium (COLACE) 100 MG capsule Take 1 capsule (100 mg total) by mouth 2 (two) times daily. 120 capsule 2  . Fe Cbn-Fe Gluc-FA-B12-C-DSS (FERRALET 90) 90-1 MG TABS Take 1 tablet by mouth daily. 30 each 3  . ibuprofen (ADVIL,MOTRIN) 800 MG tablet Take 1 tablet (800 mg total) by mouth every 8 (eight) hours as needed. 50 tablet 1  . HYDROcodone-acetaminophen (NORCO/VICODIN) 5-325 MG tablet Take 1 tablet by mouth every 4 (four) hours as needed for severe pain. 10 tablet 0  . methocarbamol (ROBAXIN) 500 MG tablet Take 1 tablet (500 mg total) by mouth 2 (two) times daily. 20 tablet 0  . naproxen (NAPROSYN) 500 MG tablet Take 1 tablet (500 mg total) by mouth 2 (two) times daily. 30 tablet 0  . mirabegron ER (MYRBETRIQ) 50 MG TB24 tablet Take 50 mg by mouth daily.     No facility-administered  medications prior to visit.     No Known Allergies  Review of Systems  Constitutional: Negative for fever.  HENT: Negative for congestion.   Eyes: Negative for blurred vision.  Respiratory: Negative for cough.   Cardiovascular: Negative for chest pain and palpitations.  Gastrointestinal: Negative for vomiting.  Musculoskeletal: Negative for back pain.  Skin: Negative for rash.  Neurological: Negative for loss of consciousness and headaches.       Objective:    Physical Exam  Constitutional: She is oriented to person, place, and time. She appears well-developed and well-nourished. No distress.  HENT:  Head: Normocephalic and atraumatic.  Eyes: Conjunctivae are normal. Pupils are equal, round, and reactive to light.  Neck: Normal range of motion.  No thyromegaly present.  Cardiovascular: Normal rate and regular rhythm.   Pulmonary/Chest: Effort normal and breath sounds normal. She has no wheezes.  Abdominal: Soft. Bowel sounds are normal. There is no tenderness.  Musculoskeletal: She exhibits tenderness. She exhibits no edema or deformity.       Right hip: She exhibits decreased strength and tenderness.       Right knee: She exhibits decreased range of motion. Tenderness found.  Neurological: She is alert and oriented to person, place, and time.  Skin: Skin is warm and dry. She is not diaphoretic.  Psychiatric: She has a normal mood and affect.    BP 112/78 (BP Location: Right Arm, Patient Position: Sitting, Cuff Size: Normal)   Pulse 72   Temp 98.6 F (37 C) (Oral)   Resp 16   Ht 5\' 7"  (1.702 m)   Wt 207 lb 6.4 oz (94.1 kg)   LMP 02/14/2015 (Approximate)   SpO2 96%   BMI 32.48 kg/m  Wt Readings from Last 3 Encounters:  06/16/16 207 lb 6.4 oz (94.1 kg)  01/12/16 200 lb (90.7 kg)  04/30/15 189 lb 3.2 oz (85.8 kg)     Lab Results  Component Value Date   WBC 5.8 06/16/2016   HGB 13.4 06/16/2016   HCT 40.6 06/16/2016   PLT 247.0 06/16/2016   GLUCOSE 84 06/16/2016   CHOL 160 06/07/2012   TRIG 101.0 06/07/2012   HDL 49.10 06/07/2012   LDLCALC 91 06/07/2012   ALT 17 06/16/2016   AST 17 06/16/2016   NA 138 06/16/2016   K 3.9 06/16/2016   CL 105 06/16/2016   CREATININE 0.90 06/16/2016   BUN 13 06/16/2016   CO2 27 06/16/2016   TSH 0.44 06/16/2016   HGBA1C 5.5 01/09/2013   MICROALBUR 0.1 06/07/2012    Lab Results  Component Value Date   TSH 0.44 06/16/2016   Lab Results  Component Value Date   WBC 5.8 06/16/2016   HGB 13.4 06/16/2016   HCT 40.6 06/16/2016   MCV 78.5 06/16/2016   PLT 247.0 06/16/2016   Lab Results  Component Value Date   NA 138 06/16/2016   K 3.9 06/16/2016   CO2 27 06/16/2016   GLUCOSE 84 06/16/2016   BUN 13 06/16/2016   CREATININE 0.90 06/16/2016   BILITOT 0.3 06/16/2016    ALKPHOS 60 06/16/2016   AST 17 06/16/2016   ALT 17 06/16/2016   PROT 6.7 06/16/2016   ALBUMIN 3.9 06/16/2016   CALCIUM 9.4 06/16/2016   ANIONGAP 4 (L) 03/17/2015   GFR 85.71 06/16/2016   Lab Results  Component Value Date   CHOL 160 06/07/2012   Lab Results  Component Value Date   HDL 49.10 06/07/2012   Lab Results  Component Value Date  [place] 91 06/07/2012   Lab Results  Component Value Date   TRIG 101.0 06/07/2012   Lab Results  Component Value Date   CHOLHDL 3 06/07/2012   Lab Results  Component Value Date   HGBA1C 5.5 01/09/2013       Assessment & Plan:   Problem List Items Addressed This Visit    None    Visit Diagnoses    Numbness and tingling of left leg    -  Primary   Relevant Medications   pregabalin (LYRICA) 50 MG capsule   Other Relevant Orders   CBC w/Diff (Completed)   Ferritin (Completed)   IBC panel (Completed)   Comprehensive metabolic panel (Completed)   TSH (Completed)   B12 (Completed)   DG Lumbar Spine 2-3 Views (Completed)   VITAMIN D 25 Hydroxy (Vit-D Deficiency, Fractures) (Completed)      I have discontinued Ms. Powell's HYDROcodone-acetaminophen, naproxen, and methocarbamol. I am also having her start on pregabalin. Additionally, I am having her maintain her mirabegron ER, ibuprofen, docusate sodium, and FERRALET 90.  Meds ordered this encounter  Medications  . pregabalin (LYRICA) 50 MG capsule    Sig: Take 1 capsule (50 mg total) by mouth 3 (three) times daily.    Dispense:  30 capsule    Refill:  0    CMA served as scribe during this visit. History, Physical and Plan performed by medical provider. Documentation and orders reviewed and attested to.  [name], DO Patient ID: KELAHNI VATTER, female   DOB: 1966-03-27, 51 y.o.   MRN: [id number]

## 2016-06-16 NOTE — Patient Instructions (Signed)
Neuropathic Pain Introduction Neuropathic pain is pain caused by damage to the nerves that are responsible for certain sensations in your body (sensory nerves). The pain can be caused by damage to:  The sensory nerves that send signals to your spinal cord and brain (peripheral nervous system).  The sensory nerves in your brain or spinal cord (central nervous system). Neuropathic pain can make you more sensitive to pain. What would be a minor sensation for most people may feel very painful if you have neuropathic pain. This is usually a long-term condition that can be difficult to treat. The type of pain can differ from person to person. It may start suddenly (acute), or it may develop slowly and last for a long time (chronic). Neuropathic pain may come and go as damaged nerves heal or may stay at the same level for years. It often causes emotional distress, loss of sleep, and a lower quality of life. What are the causes? The most common cause of damage to a sensory nerve is diabetes. Many other diseases and conditions can also cause neuropathic pain. Causes of neuropathic pain can be classified as:  Toxic. Many drugs and chemicals can cause toxic damage. The most common cause of toxic neuropathic pain is damage from drug treatment for cancer (chemotherapy).  Metabolic. This type of pain can happen when a disease causes imbalances that damage nerves. Diabetes is the most common of these diseases. Vitamin B deficiency caused by long-term alcohol abuse is another common cause.  Traumatic. Any injury that cuts, crushes, or stretches a nerve can cause damage and pain. A common example is feeling pain after losing an arm or leg (phantom limb pain).  Compression-related. If a sensory nerve gets trapped or compressed for a long period of time, the blood supply to the nerve can be cut off.  Vascular. Many blood vessel diseases can cause neuropathic pain by decreasing blood supply and oxygen to  nerves.  Autoimmune. This type of pain results from diseases in which the body's defense system mistakenly attacks sensory nerves. Examples of autoimmune diseases that can cause neuropathic pain include lupus and multiple sclerosis.  Infectious. Many types of viral infections can damage sensory nerves and cause pain. Shingles infection is a common cause of this type of pain.  Inherited. Neuropathic pain can be a symptom of many diseases that are passed down through families (genetic). What are the signs or symptoms? The main symptom is pain. Neuropathic pain is often described as:  Burning.  Shock-like.  Stinging.  Hot or cold.  Itching. How is this diagnosed? No single test can diagnose neuropathic pain. Your health care provider will do a physical exam and ask you about your pain. You may use a pain scale to describe how bad your pain is. You may also have tests to see if you have a high sensitivity to pain and to help find the cause and location of any sensory nerve damage. These tests may include:  Imaging studies, such as:  X-rays.  CT scan.  MRI.  Nerve conduction studies to test how well nerve signals travel through your sensory nerves (electrodiagnostic testing).  Stimulating your sensory nerves through electrodes on your skin and measuring the response in your spinal cord and brain (somatosensory evoked potentials). How is this treated? Treatment for neuropathic pain may change over time. You may need to try different treatment options or a combination of treatments. Some options include:  Over-the-counter pain relievers.  Prescription medicines. Some medicines used to treat   other conditions may also help neuropathic pain. These include medicines to:  Control seizures (anticonvulsants).  Relieve depression (antidepressants).  Prescription-strength pain relievers (narcotics). These are usually used when other pain relievers do not help.  Transcutaneous nerve  stimulation (TENS). This uses electrical currents to block painful nerve signals. The treatment is painless.  Topical and local anesthetics. These are medicines that numb the nerves. They can be injected as a nerve block or applied to the skin.  Alternative treatments, such as:  Acupuncture.  Meditation.  Massage.  Physical therapy.  Pain management programs.  Counseling. Follow these instructions at home:  Learn as much as you can about your condition.  Take medicines only as directed by your health care provider.  Work closely with all your health care providers to find what works best for you.  Have a good support system at home.  Consider joining a chronic pain support group. Contact a health care provider if:  Your pain treatments are not helping.  You are having side effects from your medicines.  You are struggling with fatigue, mood changes, depression, or anxiety. This information is not intended to replace advice given to you by your health care provider. Make sure you discuss any questions you have with your health care provider. Document Released: 01/09/2004 Document Revised: 11/01/2015 Document Reviewed: 09/21/2013  2017 Elsevier  

## 2016-06-16 NOTE — Progress Notes (Signed)
Pre visit review using our clinic review tool, if applicable. No additional management support is needed unless otherwise documented below in the visit note. 

## 2016-06-17 ENCOUNTER — Other Ambulatory Visit: Payer: Self-pay | Admitting: *Deleted

## 2016-06-17 DIAGNOSIS — R202 Paresthesia of skin: Principal | ICD-10-CM

## 2016-06-17 DIAGNOSIS — R200 Anesthesia of skin: Secondary | ICD-10-CM

## 2016-06-17 LAB — COMPREHENSIVE METABOLIC PANEL
ALBUMIN: 3.9 g/dL (ref 3.5–5.2)
ALT: 17 U/L (ref 0–35)
AST: 17 U/L (ref 0–37)
Alkaline Phosphatase: 60 U/L (ref 39–117)
BUN: 13 mg/dL (ref 6–23)
CHLORIDE: 105 meq/L (ref 96–112)
CO2: 27 meq/L (ref 19–32)
Calcium: 9.4 mg/dL (ref 8.4–10.5)
Creatinine, Ser: 0.9 mg/dL (ref 0.40–1.20)
GFR: 85.71 mL/min (ref >60.00)
Glucose, Bld: 84 mg/dL (ref 70–99)
POTASSIUM: 3.9 meq/L (ref 3.5–5.1)
SODIUM: 138 meq/L (ref 135–145)
Total Bilirubin: 0.3 mg/dL (ref 0.2–1.2)
Total Protein: 6.7 g/dL (ref 6.0–8.3)

## 2016-06-17 LAB — CBC WITH DIFFERENTIAL/PLATELET
BASOS ABS: 0 10*3/uL (ref 0.0–0.1)
Basophils Relative: 0.7 % (ref 0.0–3.0)
EOS ABS: 0.1 10*3/uL (ref 0.0–0.7)
Eosinophils Relative: 1.1 % (ref 0.0–5.0)
HEMATOCRIT: 40.6 % (ref 36.0–46.0)
HEMOGLOBIN: 13.4 g/dL (ref 12.0–15.0)
Lymphocytes Relative: 30 % (ref 12.0–46.0)
Lymphs Abs: 1.8 10*3/uL (ref 0.7–4.0)
MCHC: 32.9 g/dL (ref 30.0–36.0)
MCV: 78.5 fl (ref 78.0–100.0)
MONO ABS: 0.5 10*3/uL (ref 0.1–1.0)
Monocytes Relative: 8.1 % (ref 3.0–12.0)
Neutro Abs: 3.5 10*3/uL (ref 1.4–7.7)
Neutrophils Relative %: 60.1 % (ref 43.0–77.0)
Platelets: 247 10*3/uL (ref 150.0–400.0)
RBC: 5.17 Mil/uL — AB (ref 3.87–5.11)
RDW: 13.9 % (ref 11.5–15.5)
WBC: 5.8 10*3/uL (ref 4.0–10.5)

## 2016-06-17 LAB — VITAMIN D 25 HYDROXY (VIT D DEFICIENCY, FRACTURES): VITD: 13.28 ng/mL — ABNORMAL LOW (ref 30.00–100.00)

## 2016-06-17 LAB — TSH: TSH: 0.44 u[IU]/mL (ref 0.35–4.50)

## 2016-06-17 LAB — IBC PANEL
IRON: 62 ug/dL (ref 42–145)
SATURATION RATIOS: 17.6 % — AB (ref 20.0–50.0)
TRANSFERRIN: 252 mg/dL (ref 212.0–360.0)

## 2016-06-17 LAB — VITAMIN B12: Vitamin B-12: 249 pg/mL (ref 211–911)

## 2016-06-17 LAB — FERRITIN: FERRITIN: 72.1 ng/mL (ref 10.0–291.0)

## 2016-06-22 ENCOUNTER — Other Ambulatory Visit: Payer: Self-pay | Admitting: Family Medicine

## 2016-06-22 DIAGNOSIS — R200 Anesthesia of skin: Secondary | ICD-10-CM

## 2016-06-22 DIAGNOSIS — R202 Paresthesia of skin: Secondary | ICD-10-CM

## 2016-06-26 ENCOUNTER — Telehealth: Payer: Self-pay | Admitting: Family Medicine

## 2016-06-26 ENCOUNTER — Telehealth: Payer: Self-pay | Admitting: *Deleted

## 2016-06-26 MED ORDER — VITAMIN D (ERGOCALCIFEROL) 1.25 MG (50000 UNIT) PO CAPS: 50000.0000 [IU] | ORAL_CAPSULE | ORAL | 2 refills | Status: DC

## 2016-06-26 NOTE — Telephone Encounter (Signed)
b12 low normal-- may benefit from b12 injections weekly x4 then monthly --- Recheck 1 month

## 2016-06-26 NOTE — Telephone Encounter (Signed)
Patient scheduled with nurse 07/01/2016

## 2016-06-26 NOTE — Telephone Encounter (Signed)
I DON'T KNOW THE DOC IN [place]---- jen or Kristie could see who is in [place]

## 2016-06-26 NOTE — Telephone Encounter (Signed)
You did a referral to ortho but she did not want to go to anyone in [place] and did not want to schedule.  She would like to know who do you recommend in [facility]? This was for numbness and tingling of left leg.

## 2016-06-26 NOTE — Telephone Encounter (Signed)
Relation to [id number] Call back [phone]   Reason for call:  Patient scheduled B12 injection with nurse for Wednesday 07/01/16, requesting orders, please advise

## 2016-06-30 NOTE — Telephone Encounter (Signed)
Patient notified

## 2016-07-01 ENCOUNTER — Ambulatory Visit (INDEPENDENT_AMBULATORY_CARE_PROVIDER_SITE_OTHER): Payer: BLUE CROSS/BLUE SHIELD

## 2016-07-01 DIAGNOSIS — E538 Deficiency of other specified B group vitamins: Secondary | ICD-10-CM | POA: Diagnosis not present

## 2016-07-01 MED ORDER — CYANOCOBALAMIN 1000 MCG/ML IJ SOLN
1000.0000 ug | Freq: Once | INTRAMUSCULAR | Status: AC
  Administered 2016-07-01: 1000 ug via INTRAMUSCULAR

## 2016-07-01 NOTE — Progress Notes (Signed)
Pre visit review using our clinic review tool, if applicable. No additional management support is needed unless otherwise documented below in the visit note.  Notes Recorded by [name], DO on 06/24/2016 at 9:19 AM EST Low vita D--- takd vita D 50,000 u weekly #4 2 refills and otc vita D3 2000 u daily b12 low normal-- may benefit from b12 injections weekly x4 then monthly --- Recheck 1 month  Pt in clinic today for first B12 injection.  Education on B12 injections provided.  Pt stated understanding and is in agreement with plan.  B12 injection given in left deltoid.  Pt tolerated injection well.  No signs of a reaction noted.    Pt would like for future B12 injections to be given by either a nurse or LPN at work.  Pt would like a prescription written for b12 injections.  Pt was advised that Dr. Etter Sjogren was currently out of the office, but would return tomorrow and that she would advise at that time.  Pt stated understand and agreed with plan.  Chart routed to Dr. Larose Kells (Doc of the Day) in Dr. Nonda Lou absence for review and sign.    Chart routed to Dr. Etter Sjogren to address patient's desire to have a nurse at work provide injections.  Please advise.

## 2016-07-08 ENCOUNTER — Telehealth: Payer: Self-pay

## 2016-07-08 ENCOUNTER — Encounter: Payer: Self-pay | Admitting: Family Medicine

## 2016-07-08 NOTE — Telephone Encounter (Addendum)
Pt came in on 07/01/16 for 1st B12 injection.  Pt expressed during that visit that she would like for a RN or LPN at her job to provide future B12 injections.      Please advise.

## 2016-07-08 NOTE — Telephone Encounter (Signed)
[id number] ° °

## 2016-07-09 MED ORDER — CYANOCOBALAMIN 1000 MCG/ML IJ SOLN: 1000.0000 ug | INTRAMUSCULAR | 0 refills | Status: DC

## 2016-07-09 NOTE — Telephone Encounter (Signed)
Rx sent to the pharmacy.  Called and made patient aware.

## 2016-07-09 NOTE — Telephone Encounter (Signed)
Ok to send injections to pharmacy

## 2016-07-20 ENCOUNTER — Other Ambulatory Visit: Payer: Self-pay | Admitting: Family Medicine

## 2016-07-20 MED ORDER — VITAMIN D (ERGOCALCIFEROL) 1.25 MG (50000 UNIT) PO CAPS: 50000.0000 [IU] | ORAL_CAPSULE | ORAL | 2 refills | Status: DC

## 2016-07-27 ENCOUNTER — Other Ambulatory Visit: Payer: Self-pay | Admitting: Family Medicine

## 2016-11-03 ENCOUNTER — Telehealth: Payer: Self-pay | Admitting: Family Medicine

## 2016-11-03 DIAGNOSIS — R202 Paresthesia of skin: Principal | ICD-10-CM

## 2016-11-03 DIAGNOSIS — R200 Anesthesia of skin: Secondary | ICD-10-CM

## 2016-11-03 MED ORDER — HYDROCHLOROTHIAZIDE 25 MG PO TABS: 25.0000 mg | ORAL_TABLET | Freq: Every day | ORAL | 2 refills | Status: DC

## 2016-11-03 MED ORDER — PREGABALIN 50 MG PO CAPS: 50.0000 mg | ORAL_CAPSULE | Freq: Three times a day (TID) | ORAL | 1 refills | Status: DC

## 2016-11-03 NOTE — Telephone Encounter (Signed)
Refill for [place]: [id number] - [place], [place] - [address] AT [address]     Pt called in to schedule an apt for swelling in her legs, she said that the medication Lyrica helped before when she had this concern. She said that she stop taking it because her swelling stopped. Pt declined speaking with a nurse. She said that provider is aware of off and on concern.

## 2016-11-03 NOTE — Telephone Encounter (Signed)
Faxed lyrica and pt notified hctz sent in as well.

## 2016-11-03 NOTE — Telephone Encounter (Signed)
appt is scheduled for 11/05/16 Advise on refill Last refill 2/18

## 2016-11-03 NOTE — Telephone Encounter (Signed)
She has continued to have numbness/tingling and have been taking lyrica on and off--does need a refill though as still needs it and states it works well for her. But--she did just last week start swelling of legs/feet mostly, had been on lasix once before but stopped due to having to go to the bathroom so often. Advise on Refil for Lyrica and the swelling?

## 2016-11-03 NOTE — Telephone Encounter (Signed)
lyrica was not for swelling it was for numbness and tingling lyrica side effect is swelling  If numbness and tingling is back we can refill

## 2016-11-03 NOTE — Telephone Encounter (Signed)
Refill lyrica hctz 25 mg #30  1 po qd prn , 2 refills

## 2016-11-05 ENCOUNTER — Encounter: Payer: Self-pay | Admitting: Family Medicine

## 2016-11-05 ENCOUNTER — Ambulatory Visit (INDEPENDENT_AMBULATORY_CARE_PROVIDER_SITE_OTHER): Payer: BLUE CROSS/BLUE SHIELD | Admitting: Family Medicine

## 2016-11-05 VITALS — BP 106/78 | HR 83 | Temp 98.0°F | Resp 16 | Ht 67.0 in | Wt 209.0 lb

## 2016-11-05 DIAGNOSIS — E538 Deficiency of other specified B group vitamins: Secondary | ICD-10-CM

## 2016-11-05 DIAGNOSIS — R0602 Shortness of breath: Secondary | ICD-10-CM | POA: Diagnosis not present

## 2016-11-05 DIAGNOSIS — G8929 Other chronic pain: Secondary | ICD-10-CM | POA: Diagnosis not present

## 2016-11-05 DIAGNOSIS — R200 Anesthesia of skin: Secondary | ICD-10-CM

## 2016-11-05 DIAGNOSIS — R600 Localized edema: Secondary | ICD-10-CM | POA: Diagnosis not present

## 2016-11-05 DIAGNOSIS — R202 Paresthesia of skin: Secondary | ICD-10-CM | POA: Diagnosis not present

## 2016-11-05 DIAGNOSIS — M5440 Lumbago with sciatica, unspecified side: Secondary | ICD-10-CM

## 2016-11-05 MED ORDER — CYANOCOBALAMIN 1000 MCG/ML IJ SOLN
INTRAMUSCULAR | 0 refills | Status: DC
Start: 2016-11-05 — End: 2018-01-07

## 2016-11-05 MED ORDER — CYANOCOBALAMIN 1000 MCG/ML IJ SOLN
1000.0000 ug | Freq: Once | INTRAMUSCULAR | Status: AC
  Administered 2016-11-05: 1000 ug via INTRAMUSCULAR

## 2016-11-05 NOTE — Progress Notes (Signed)
Patient ID: Jo Compton, female   DOB: 1965/06/24, 51 y.o.   MRN: [id number]     Subjective:  I acted as a Education administrator for Dr. Carollee Herter.  Guerry Bruin, [place]   Patient ID: Jo Compton, female    DOB: [birth year], 51 y.o.   MRN: [id number]  Chief Complaint  Patient presents with  . Back Pain  . Edema    Back Pain  Episode onset: since February. The problem occurs constantly. The pain is present in the lumbar spine. The quality of the pain is described as aching, burning, cramping, shooting and stabbing. Radiates to: right leg. The symptoms are aggravated by bending, position, standing and twisting. Associated symptoms include leg pain, numbness and tingling. Pertinent negatives include no bladder incontinence, bowel incontinence, chest pain, dysuria, fever or headaches. Treatments tried: lyrica.   pt is requesting to see ortho.   Patient is in today for back pain and swollen feet.  Both feet has been swollen for a while.  She was on lasix for a while but stopped because it made her go to the bathroom so much. She picked up hctz but has not taken it yet.  No chest pain     Patient Care Team: Carollee Herter, Alferd Apa, DO as PCP - General (Family Medicine) Ena Dawley, MD as Consulting Physician (Obstetrics and Gynecology)   Past Medical History:  Diagnosis Date  . Anemia   . Depression   . Fistula    rectal  . OAB (overactive bladder)   . Rectal bleeding     Past Surgical History:  Procedure Laterality Date  . ABDOMINAL HYSTERECTOMY Bilateral 03/20/2015   Procedure: HYSTERECTOMY ABDOMINAL, EXCISION MESENTERIC NODULE, BLADDER FLAP BIOPSY;  Surgeon: Ena Dawley, MD;  Location: [facility];  Service: Gynecology;  Laterality: Bilateral;  . APPENDECTOMY    . BILATERAL SALPINGECTOMY Bilateral 03/20/2015   Procedure: BILATERAL SALPINGECTOMY;  Surgeon: Ena Dawley, MD;  Location: [facility];  Service: Gynecology;  Laterality: Bilateral;  . COLONOSCOPY    . CYSTOSCOPY N/A 03/20/2015   Procedure: CYSTOSCOPY;  Surgeon: Ena Dawley, MD;  Location: [facility];  Service: Gynecology;  Laterality: N/A;  . LAPAROSCOPY N/A 03/20/2015   Procedure: LAPAROSCOPY DIAGNOSTIC;  Surgeon: Ena Dawley, MD;  Location: [facility];  Service: Gynecology;  Laterality: N/A;    Family History  Problem Relation Age of Onset  . Hypertension Mother   . COPD Mother   . Cancer Maternal Uncle        colon  . Cancer Maternal Uncle        liver  . Hypertension Maternal Aunt   . Diabetes Maternal Aunt     Social History   Social History  . Marital status: Divorced    Spouse name: N/A  . Number of children: N/A  . Years of education: N/A   Occupational History  . Land at [employer]    Social History Main Topics  . Smoking status: Never Smoker  . Smokeless tobacco: Never Used  . Alcohol use 0.6 oz/week    1 Glasses of wine per week  . Drug use: No  . Sexual activity: Yes    Birth control/ protection: None   Other Topics Concern  . Not on file   Social History Narrative   ** Merged History Encounter **        Outpatient Medications Prior to Visit  Medication Sig Dispense Refill  . docusate sodium (COLACE) 100 MG capsule Take 1 capsule (100 mg total) by mouth  2 (two) times daily. 120 capsule 2  . Fe Cbn-Fe Gluc-FA-B12-C-DSS (FERRALET 90) 90-1 MG TABS Take 1 tablet by mouth daily. 30 each 3  . hydrochlorothiazide (HYDRODIURIL) 25 MG tablet Take 1 tablet (25 mg total) by mouth daily. 30 tablet 2  . ibuprofen (ADVIL,MOTRIN) 800 MG tablet Take 1 tablet (800 mg total) by mouth every 8 (eight) hours as needed. 50 tablet 1  . pregabalin (LYRICA) 50 MG capsule Take 1 capsule (50 mg total) by mouth 3 (three) times daily. 30 capsule 1  . Vitamin D, Ergocalciferol, (DRISDOL) 50000 units CAPS capsule Take 1 capsule (50,000 Units total) by mouth every 7 (seven) days. 12 capsule 2  . cyanocobalamin (,VITAMIN B-12,) 1000 MCG/ML injection INJECT 1ML INTO MUSCLE ONCE A WEEK.  INJECTION WEEKLY X3. THEM MONTHLY INJECTION. CHECK B12 LEVELS 3 mL 0  . mirabegron ER (MYRBETRIQ) 50 MG TB24 tablet Take 50 mg by mouth daily.     No facility-administered medications prior to visit.     No Known Allergies  Review of Systems  Constitutional: Positive for malaise/fatigue. Negative for fever.       Swollen feet  HENT: Negative for congestion.   Eyes: Negative for blurred vision.  Respiratory: Negative for cough and shortness of breath.   Cardiovascular: Negative for chest pain, palpitations and leg swelling.  Gastrointestinal: Negative for bowel incontinence and vomiting.  Genitourinary: Negative for bladder incontinence and dysuria.  Musculoskeletal: Positive for back pain.  Skin: Negative for rash.  Neurological: Positive for tingling and numbness. Negative for loss of consciousness and headaches.       Objective:    Physical Exam  Constitutional: She is oriented to person, place, and time. She appears well-developed and well-nourished.  HENT:  Head: Normocephalic and atraumatic.  Eyes: Conjunctivae and EOM are normal.  Neck: Normal range of motion. Neck supple. No JVD present. Carotid bruit is not present. No thyromegaly present.  Cardiovascular: Normal rate, regular rhythm and normal heart sounds.   No murmur heard. Pulmonary/Chest: Effort normal and breath sounds normal. No respiratory distress. She has no wheezes. She has no rales. She exhibits no tenderness.  Musculoskeletal: She exhibits edema.       Lumbar back: She exhibits tenderness, pain and spasm.  Neurological: She is alert and oriented to person, place, and time. She has normal reflexes. She displays normal reflexes. She exhibits normal muscle tone. Coordination abnormal.  Psychiatric: She has a normal mood and affect.  Vitals reviewed.   BP 106/78 (BP Location: Left Arm, Cuff Size: Normal)   Pulse 83   Temp 98 F (36.7 C) (Oral)   Resp 16   Ht 5\' 7"  (1.702 m)   Wt 209 lb (94.8 kg)   LMP  02/14/2015 (Approximate)   SpO2 97%   BMI 32.73 kg/m  Wt Readings from Last 3 Encounters:  11/05/16 209 lb (94.8 kg)  06/16/16 207 lb 6.4 oz (94.1 kg)  01/12/16 200 lb (90.7 kg)   BP Readings from Last 3 Encounters:  11/05/16 106/78  06/16/16 112/78  01/12/16 128/92     Immunization History  Administered Date(s) Administered  . Tdap 08/04/2010    Health Maintenance  Topic Date Due  . HIV Screening  10/27/1982  . COLONOSCOPY  05/28/2016  . INFLUENZA VACCINE  11/25/2016  . PAP SMEAR  11/08/2017  . TETANUS/TDAP  08/03/2020    Lab Results  Component Value Date   WBC 5.8 06/16/2016   HGB 13.4 06/16/2016   HCT 40.6 06/16/2016  PLT 247.0 06/16/2016   GLUCOSE 84 06/16/2016   CHOL 160 06/07/2012   TRIG 101.0 06/07/2012   HDL 49.10 06/07/2012   LDLCALC 91 06/07/2012   ALT 17 06/16/2016   AST 17 06/16/2016   NA 138 06/16/2016   K 3.9 06/16/2016   CL 105 06/16/2016   CREATININE 0.90 06/16/2016   BUN 13 06/16/2016   CO2 27 06/16/2016   TSH 0.44 06/16/2016   HGBA1C 5.5 01/09/2013   MICROALBUR 0.1 06/07/2012    Lab Results  Component Value Date   TSH 0.44 06/16/2016   Lab Results  Component Value Date   WBC 5.8 06/16/2016   HGB 13.4 06/16/2016   HCT 40.6 06/16/2016   MCV 78.5 06/16/2016   PLT 247.0 06/16/2016   Lab Results  Component Value Date   NA 138 06/16/2016   K 3.9 06/16/2016   CO2 27 06/16/2016   GLUCOSE 84 06/16/2016   BUN 13 06/16/2016   CREATININE 0.90 06/16/2016   BILITOT 0.3 06/16/2016   ALKPHOS 60 06/16/2016   AST 17 06/16/2016   ALT 17 06/16/2016   PROT 6.7 06/16/2016   ALBUMIN 3.9 06/16/2016   CALCIUM 9.4 06/16/2016   ANIONGAP 4 (L) 03/17/2015   GFR 85.71 06/16/2016   Lab Results  Component Value Date   CHOL 160 06/07/2012   Lab Results  Component Value Date   HDL 49.10 06/07/2012   Lab Results  Component Value Date   LDLCALC 91 06/07/2012   Lab Results  Component Value Date   TRIG 101.0 06/07/2012   Lab Results    Component Value Date   CHOLHDL 3 06/07/2012   Lab Results  Component Value Date   HGBA1C 5.5 01/09/2013         Assessment & Plan:   Problem List Items Addressed This Visit      Unprioritized   Chronic low back pain with sciatica    Refer to ortho at pt request      Edema, lower extremity    Elevated legs Restart diuretic--- change to hctz  Check echo      Relevant Orders   ECHOCARDIOGRAM COMPLETE   Comprehensive metabolic panel    Other Visit Diagnoses    Numbness and tingling of left leg    -  Primary   Relevant Orders   Ambulatory referral to Orthopedic Surgery   B12 deficiency       Relevant Medications   cyanocobalamin (,VITAMIN B-12,) 1000 MCG/ML injection   cyanocobalamin ((VITAMIN B-12)) injection 1,000 mcg (Completed)   Other Relevant Orders   Vitamin B12   SOB (shortness of breath)       Relevant Orders   Comprehensive metabolic panel      I have discontinued Ms. Powell's mirabegron ER. I am also having her maintain her ibuprofen, docusate sodium, FERRALET 90, Vitamin D (Ergocalciferol), pregabalin, hydrochlorothiazide, and cyanocobalamin. We administered cyanocobalamin.  Meds ordered this encounter  Medications  . cyanocobalamin (,VITAMIN B-12,) 1000 MCG/ML injection    Sig: INJECT 1ML INTO MUSCLE ONCE A WEEK. INJECTION WEEKLY X3. THEM MONTHLY INJECTION. CHECK B12 LEVELS    Dispense:  3 mL    Refill:  0  . cyanocobalamin ((VITAMIN B-12)) injection 1,000 mcg    CMA served as scribe during this visit. History, Physical and Plan performed by medical provider. Documentation and orders reviewed and attested to.  [name], DO

## 2016-11-05 NOTE — Patient Instructions (Signed)
Edema Edema is when you have too much fluid in your body or under your skin. Edema may make your legs, feet, and ankles swell up. Swelling is also common in looser tissues, like around your eyes. This is a common condition. It gets more common as you get older. There are many possible causes of edema. Eating too much salt (sodium) and being on your feet or sitting for a long time can cause edema in your legs, feet, and ankles. Hot weather may make edema worse. Edema is usually painless. Your skin may look swollen or shiny. Follow these instructions at home:  Keep the swollen body part raised (elevated) above the level of your heart when you are sitting or lying down.  Do not sit still or stand for a long time.  Do not wear tight clothes. Do not wear garters on your upper legs.  Exercise your legs. This can help the swelling go down.  Wear elastic bandages or support stockings as told by your doctor.  Eat a low-salt (low-sodium) diet to reduce fluid as told by your doctor.  Depending on the cause of your swelling, you may need to limit how much fluid you drink (fluid restriction).  Take over-the-counter and prescription medicines only as told by your doctor. Contact a doctor if:  Treatment is not working.  You have heart, liver, or kidney disease and have symptoms of edema.  You have sudden and unexplained weight gain. Get help right away if:  You have shortness of breath or chest pain.  You cannot breathe when you lie down.  You have pain, redness, or warmth in the swollen areas.  You have heart, liver, or kidney disease and get edema all of a sudden.  You have a fever and your symptoms get worse all of a sudden. Summary  Edema is when you have too much fluid in your body or under your skin.  Edema may make your legs, feet, and ankles swell up. Swelling is also common in looser tissues, like around your eyes.  Raise (elevate) the swollen body part above the level of your  heart when you are sitting or lying down.  Follow your doctor's instructions about diet and how much fluid you can drink (fluid restriction). This information is not intended to replace advice given to you by your health care provider. Make sure you discuss any questions you have with your health care provider. Document Released: 09/30/2007 Document Revised: 05/01/2016 Document Reviewed: 05/01/2016 Elsevier Interactive Patient Education  2017 Elsevier Inc.  

## 2016-11-08 DIAGNOSIS — G8929 Other chronic pain: Secondary | ICD-10-CM | POA: Insufficient documentation

## 2016-11-08 DIAGNOSIS — R600 Localized edema: Secondary | ICD-10-CM | POA: Insufficient documentation

## 2016-11-08 DIAGNOSIS — M5440 Lumbago with sciatica, unspecified side: Secondary | ICD-10-CM

## 2016-11-08 NOTE — Assessment & Plan Note (Signed)
Refer to ortho at pt request

## 2016-11-08 NOTE — Assessment & Plan Note (Signed)
Elevated legs Restart diuretic--- change to hctz  Check echo

## 2016-11-18 ENCOUNTER — Ambulatory Visit (HOSPITAL_BASED_OUTPATIENT_CLINIC_OR_DEPARTMENT_OTHER): Payer: BLUE CROSS/BLUE SHIELD

## 2017-02-28 ENCOUNTER — Other Ambulatory Visit: Payer: Self-pay | Admitting: Family Medicine

## 2017-03-16 ENCOUNTER — Other Ambulatory Visit: Payer: Self-pay | Admitting: Family Medicine

## 2017-03-16 DIAGNOSIS — D62 Acute posthemorrhagic anemia: Secondary | ICD-10-CM

## 2017-04-02 ENCOUNTER — Other Ambulatory Visit: Payer: Self-pay | Admitting: Family Medicine

## 2017-04-15 ENCOUNTER — Ambulatory Visit: Payer: BLUE CROSS/BLUE SHIELD | Admitting: Family Medicine

## 2017-04-15 ENCOUNTER — Encounter: Payer: Self-pay | Admitting: Family Medicine

## 2017-04-15 VITALS — BP 122/82 | HR 83 | Temp 98.2°F | Resp 16 | Ht 67.0 in | Wt 202.0 lb

## 2017-04-15 DIAGNOSIS — K602 Anal fissure, unspecified: Secondary | ICD-10-CM | POA: Diagnosis not present

## 2017-04-15 DIAGNOSIS — I10 Essential (primary) hypertension: Secondary | ICD-10-CM

## 2017-04-15 DIAGNOSIS — R0602 Shortness of breath: Secondary | ICD-10-CM

## 2017-04-15 MED ORDER — DILTIAZEM HCL POWD: 0 refills | Status: DC

## 2017-04-15 NOTE — Assessment & Plan Note (Signed)
Check echo ekg-- ns t wave abnormality otherwise normal

## 2017-04-15 NOTE — Progress Notes (Signed)
Patient ID: ADIYA SELMER, female   DOB: [birth year], 51 y.o.   MRN: [id number]    Subjective:  I acted as a Education administrator for Dr. Carollee Herter.  Guerry Bruin, [place]   Patient ID: GENIENE LIST, female    DOB: 01-09-1966, 51 y.o.   MRN: [id number]  Chief Complaint  Patient presents with  . Anal Fissure  . hard to catch breath      HPI   Patient is in today for anal fissure and seems like its hard to catch breath.  Fissure she noticed about 2 weeks ago.  It has been hard to catch breath for a while.  Patient Care Team: Carollee Herter, Alferd Apa, DO as PCP - General (Family Medicine) Ena Dawley, MD as Consulting Physician (Obstetrics and Gynecology)   Past Medical History:  Diagnosis Date  . Anemia   . Depression   . Fistula    rectal  . OAB (overactive bladder)   . Rectal bleeding     Past Surgical History:  Procedure Laterality Date  . ABDOMINAL HYSTERECTOMY Bilateral 03/20/2015   Procedure: HYSTERECTOMY ABDOMINAL, EXCISION MESENTERIC NODULE, BLADDER FLAP BIOPSY;  Surgeon: Ena Dawley, MD;  Location: [facility];  Service: Gynecology;  Laterality: Bilateral;  . APPENDECTOMY    . BILATERAL SALPINGECTOMY Bilateral 03/20/2015   Procedure: BILATERAL SALPINGECTOMY;  Surgeon: Ena Dawley, MD;  Location: [facility];  Service: Gynecology;  Laterality: Bilateral;  . COLONOSCOPY    . CYSTOSCOPY N/A 03/20/2015   Procedure: CYSTOSCOPY;  Surgeon: Ena Dawley, MD;  Location: [facility];  Service: Gynecology;  Laterality: N/A;  . LAPAROSCOPY N/A 03/20/2015   Procedure: LAPAROSCOPY DIAGNOSTIC;  Surgeon: Ena Dawley, MD;  Location: [facility];  Service: Gynecology;  Laterality: N/A;    Family History  Problem Relation Age of Onset  . Hypertension Mother   . COPD Mother   . Cancer Maternal Uncle        colon  . Cancer Maternal Uncle        liver  . Hypertension Maternal Aunt   . Diabetes Maternal Aunt     Social History   Socioeconomic History  . Marital status: Divorced    Spouse name:  Not on file  . Number of children: Not on file  . Years of education: Not on file  . Highest education level: Not on file  Social Needs  . Financial resource strain: Not on file  . Food insecurity - worry: Not on file  . Food insecurity - inability: Not on file  . Transportation needs - medical: Not on file  . Transportation needs - non-medical: Not on file  Occupational History  . Occupation: Land at [employer]  . Smoking status: Never Smoker  . Smokeless tobacco: Never Used  Substance and Sexual Activity  . Alcohol use: Yes    Alcohol/week: 0.6 oz    Types: 1 Glasses of wine per week  . Drug use: No  . Sexual activity: Yes    Birth control/protection: None  Other Topics Concern  . Not on file  Social History Narrative   ** Merged History Encounter **        Outpatient Medications Prior to Visit  Medication Sig Dispense Refill  . cyanocobalamin (,VITAMIN B-12,) 1000 MCG/ML injection INJECT 1ML INTO MUSCLE ONCE A WEEK. INJECTION WEEKLY X3. THEM MONTHLY INJECTION. CHECK B12 LEVELS 3 mL 0  . docusate sodium (COLACE) 100 MG capsule Take 1 capsule (100 mg total) by mouth 2 (two) times daily.  120 capsule 2  . Fe Cbn-Fe Gluc-FA-B12-C-DSS (FERRALET 90) 90-1 MG TABS TAKE 1 TABLET BY MOUTH EVERY DAY 30 each 2  . hydrochlorothiazide (HYDRODIURIL) 25 MG tablet Take 1 tablet (25 mg total) by mouth daily. 30 tablet 2  . ibuprofen (ADVIL,MOTRIN) 800 MG tablet Take 1 tablet (800 mg total) by mouth every 8 (eight) hours as needed. 50 tablet 1  . pregabalin (LYRICA) 50 MG capsule Take 1 capsule (50 mg total) by mouth 3 (three) times daily. 30 capsule 1  . Vitamin D, Ergocalciferol, (DRISDOL) 50000 units CAPS capsule Take 1 capsule (50,000 Units total) by mouth every 7 (seven) days. 12 capsule 2  . cyanocobalamin (,VITAMIN B-12,) 1000 MCG/ML injection INJECT 1ML INTO MUSCLE ONCE A WEEK. INJECTION WEEKLY X3. THEM MONTHLY INJECTION. CHECK B12 LEVELS 3 mL 0   No  facility-administered medications prior to visit.     No Known Allergies  Review of Systems  Constitutional: Negative for fever and malaise/fatigue.  HENT: Negative for congestion.   Eyes: Negative for blurred vision.  Respiratory: Positive for shortness of breath. Negative for cough.   Cardiovascular: Negative for chest pain, palpitations and leg swelling.  Gastrointestinal: Negative for blood in stool and vomiting.       + blood on tp and sometimes in bowl  No consistently Hx fissure and she feels like that is what it is  Musculoskeletal: Negative for back pain.  Skin: Negative for rash.  Neurological: Negative for loss of consciousness and headaches.       Objective:    Physical Exam  Constitutional: She is oriented to person, place, and time. She appears well-developed and well-nourished.  HENT:  Head: Normocephalic and atraumatic.  Eyes: Conjunctivae and EOM are normal.  Neck: Normal range of motion. Neck supple. No JVD present. Carotid bruit is not present. No thyromegaly present.  Cardiovascular: Normal rate, regular rhythm and normal heart sounds.  No murmur heard. Pulmonary/Chest: Effort normal and breath sounds normal. No respiratory distress. She has no wheezes. She has no rales. She exhibits no tenderness.  Genitourinary: Rectal exam shows fissure.  Genitourinary Comments: + skin tags  + fissure midline/ post   Musculoskeletal: She exhibits no edema.  Neurological: She is alert and oriented to person, place, and time.  Psychiatric: She has a normal mood and affect.  Nursing note and vitals reviewed.   BP 122/82 (BP Location: Right Arm, Cuff Size: Normal)   Pulse 83   Temp 98.2 F (36.8 C) (Oral)   Resp 16   Ht 5\' 7"  (1.702 m)   Wt 202 lb (91.6 kg)   LMP 02/14/2015 (Approximate)   SpO2 97%   BMI 31.64 kg/m  Wt Readings from Last 3 Encounters:  04/15/17 202 lb (91.6 kg)  11/05/16 209 lb (94.8 kg)  06/16/16 207 lb 6.4 oz (94.1 kg)   BP Readings from  Last 3 Encounters:  04/15/17 122/82  11/05/16 106/78  06/16/16 112/78     Immunization History  Administered Date(s) Administered  . Tdap 08/04/2010    Health Maintenance  Topic Date Due  . HIV Screening  10/27/1982  . COLONOSCOPY  05/28/2016  . INFLUENZA VACCINE  11/25/2016  . PAP SMEAR  11/08/2017  . TETANUS/TDAP  08/03/2020    Lab Results  Component Value Date   WBC 5.8 06/16/2016   HGB 13.4 06/16/2016   HCT 40.6 06/16/2016   PLT 247.0 06/16/2016   GLUCOSE 84 06/16/2016   CHOL 160 06/07/2012   TRIG 101.0 06/07/2012  HDL 49.10 06/07/2012   LDLCALC 91 06/07/2012   ALT 17 06/16/2016   AST 17 06/16/2016   NA 138 06/16/2016   K 3.9 06/16/2016   CL 105 06/16/2016   CREATININE 0.90 06/16/2016   BUN 13 06/16/2016   CO2 27 06/16/2016   TSH 0.44 06/16/2016   HGBA1C 5.5 01/09/2013   MICROALBUR 0.1 06/07/2012    Lab Results  Component Value Date   TSH 0.44 06/16/2016   Lab Results  Component Value Date   WBC 5.8 06/16/2016   HGB 13.4 06/16/2016   HCT 40.6 06/16/2016   MCV 78.5 06/16/2016   PLT 247.0 06/16/2016   Lab Results  Component Value Date   NA 138 06/16/2016   K 3.9 06/16/2016   CO2 27 06/16/2016   GLUCOSE 84 06/16/2016   BUN 13 06/16/2016   CREATININE 0.90 06/16/2016   BILITOT 0.3 06/16/2016   ALKPHOS 60 06/16/2016   AST 17 06/16/2016   ALT 17 06/16/2016   PROT 6.7 06/16/2016   ALBUMIN 3.9 06/16/2016   CALCIUM 9.4 06/16/2016   ANIONGAP 4 (L) 03/17/2015   GFR 85.71 06/16/2016   Lab Results  Component Value Date   CHOL 160 06/07/2012   Lab Results  Component Value Date   HDL 49.10 06/07/2012   Lab Results  Component Value Date   LDLCALC 91 06/07/2012   Lab Results  Component Value Date   TRIG 101.0 06/07/2012   Lab Results  Component Value Date   CHOLHDL 3 06/07/2012   Lab Results  Component Value Date   HGBA1C 5.5 01/09/2013         Assessment & Plan:   Problem List Items Addressed This Visit      Unprioritized    Anal fissure - Primary    Diltiazem powder refilled Refer back to GI if it does not help      Relevant Medications   DilTIAZem HCl POWD   HTN (hypertension)    Well controlled, no changes to meds. Encouraged heart healthy diet such as the DASH diet and exercise as tolerated.       SOB (shortness of breath)    Check echo ekg-- ns t wave abnormality otherwise normal      Relevant Orders   EKG 12-Lead (Completed)      I am having Amado Nash. Powell start on DilTIAZem HCl. I am also having her maintain her ibuprofen, docusate sodium, Vitamin D (Ergocalciferol), pregabalin, hydrochlorothiazide, cyanocobalamin, and FERRALET 90.  Meds ordered this encounter  Medications  . DilTIAZem HCl POWD    Sig: Apply rectally bid    Dispense:  100 g    Refill:  0    CMA served as scribe during this visit. History, Physical and Plan performed by medical provider. Documentation and orders reviewed and attested to.  [name], DO

## 2017-04-15 NOTE — Assessment & Plan Note (Signed)
Well controlled, no changes to meds. Encouraged heart healthy diet such as the DASH diet and exercise as tolerated.  °

## 2017-04-15 NOTE — Patient Instructions (Signed)
Anal Fissure, Adult An anal fissure is a small tear or crack in the skin around the opening of the butt (anus).Bleeding from the tear or crack usually stops on its own within a few minutes. The bleeding may happen every time you poop (have a bowel movement) until the tear or crack heals. Follow these instructions at home: Eating and drinking  Avoid bananas and dairy products. These foods can make it hard to poop.  Drink enough fluid to keep your pee (urine) clear or pale yellow.  Eat a lot of fruit, whole grains, and vegetables. General instructions  Keep the butt area as clean and dry as you can.  Take a warm water bath (sitz bath) as told by your doctor. Do not use soap.  Take over-the-counter and prescription medicines only as told by your doctor.  Use creams or ointments only as told by your doctor.  Keep all follow-up visits as told by your doctor. This is important. Contact a doctor if:  You have more bleeding.  You have a fever.  You have watery poop (diarrhea) that is mixed with blood.  You have pain.  You problem gets worse, not better. This information is not intended to replace advice given to you by your health care provider. Make sure you discuss any questions you have with your health care provider. Document Released: 12/10/2010 Document Revised: 09/19/2015 Document Reviewed: 07/09/2014 Elsevier Interactive Patient Education  2018 Elsevier Inc.   

## 2017-04-15 NOTE — Assessment & Plan Note (Signed)
Diltiazem powder refilled Refer back to GI if it does not help

## 2017-04-17 ENCOUNTER — Other Ambulatory Visit: Payer: Self-pay | Admitting: Family Medicine

## 2017-05-17 ENCOUNTER — Telehealth: Payer: Self-pay | Admitting: *Deleted

## 2017-05-17 ENCOUNTER — Other Ambulatory Visit: Payer: Self-pay | Admitting: *Deleted

## 2017-05-17 DIAGNOSIS — K602 Anal fissure, unspecified: Secondary | ICD-10-CM

## 2017-05-17 MED ORDER — DILTIAZEM HCL POWD: 0 refills | Status: DC

## 2017-05-17 NOTE — Telephone Encounter (Signed)
Left message on machine that we sent rx for Diltiezem to [facility].  That is were it was filled the last time because its a compounded item.

## 2017-05-27 ENCOUNTER — Other Ambulatory Visit: Payer: Self-pay | Admitting: *Deleted

## 2017-06-04 ENCOUNTER — Emergency Department (HOSPITAL_BASED_OUTPATIENT_CLINIC_OR_DEPARTMENT_OTHER)
Admission: EM | Admit: 2017-06-04 | Discharge: 2017-06-04 | Disposition: A | Discharge status: Home / Self Care | Payer: BLUE CROSS/BLUE SHIELD | Attending: Emergency Medicine | Admitting: Emergency Medicine

## 2017-06-04 ENCOUNTER — Other Ambulatory Visit: Payer: Self-pay

## 2017-06-04 ENCOUNTER — Encounter (HOSPITAL_BASED_OUTPATIENT_CLINIC_OR_DEPARTMENT_OTHER): Payer: Self-pay | Admitting: *Deleted

## 2017-06-04 DIAGNOSIS — J069 Acute upper respiratory infection, unspecified: Secondary | ICD-10-CM

## 2017-06-04 DIAGNOSIS — I10 Essential (primary) hypertension: Secondary | ICD-10-CM | POA: Insufficient documentation

## 2017-06-04 DIAGNOSIS — B9789 Other viral agents as the cause of diseases classified elsewhere: Secondary | ICD-10-CM | POA: Insufficient documentation

## 2017-06-04 DIAGNOSIS — Z79899 Other long term (current) drug therapy: Secondary | ICD-10-CM | POA: Insufficient documentation

## 2017-06-04 MED ORDER — CETIRIZINE HCL 10 MG PO TABS: 10.0000 mg | ORAL_TABLET | Freq: Every day | ORAL | 0 refills | Status: DC

## 2017-06-04 MED ORDER — BENZONATATE 100 MG PO CAPS: 100.0000 mg | ORAL_CAPSULE | Freq: Three times a day (TID) | ORAL | 0 refills | Status: DC

## 2017-06-04 MED ORDER — FLUTICASONE PROPIONATE 50 MCG/ACT NA SUSP: 1.0000 | Freq: Every day | NASAL | 2 refills | Status: DC

## 2017-06-04 NOTE — ED Triage Notes (Signed)
Congestion, runny nose, sore throat and a headache x 4 days.

## 2017-06-04 NOTE — ED Notes (Signed)
Pt discharged to home with family. NAD.  

## 2017-06-04 NOTE — Discharge Instructions (Signed)
As discussed, use the nasal spray and antihistamine and make sure that you drink plenty of fluids staying well-hydrated.  Get some rest.  Follow up with your primary care provider as needed.  Viral upper respiratory infections can show symptoms for at least a week and can take up to 2 weeks to completely clear.  Use Tylenol or ibuprofen for body aches as needed.  Return if symptoms worsen, shortness of breath, chest pain, or any other new concerning symptoms in the meantime.

## 2017-06-04 NOTE — ED Provider Notes (Signed)
[facility] EMERGENCY DEPARTMENT Provider Note   CSN: [id number] Arrival date & time: 06/04/17  1617     History   Chief Complaint Chief Complaint  Patient presents with  . URI    HPI Jo Compton is a 52 y.o. female with no significant past medical history presenting with 4 days of progressive onset upper respiratory infection symptoms.  Reports pressure in her head, congestion, cough that started a little bit today and sore throat.  Some mild body aches.  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  She states that she did not get her flu shot this year.  Has tried Mucinex and Alka-Seltzer with modest relief. Denies any fever, chills, nausea, vomiting, diarrhea, abdominal pain or other symptoms.  HPI  Past Medical History:  Diagnosis Date  . Anemia   . Depression   . Fistula    rectal  . OAB (overactive bladder)   . Rectal bleeding     Patient Active Problem List   Diagnosis Date Noted  . SOB (shortness of breath) 04/15/2017  . 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 Edema, lower extremity 11/08/2016  . Menorrhagia 03/20/2015  . Depression 12/05/2014  . Anemia, iron deficiency 08/03/2013  . Anal fissure 01/17/2013  . Hemorrhage of rectum and anus 01/17/2013  . Obesity (BMI 30-39.9) 12/17/2012  . HTN (hypertension) 08/10/2012    Past Surgical History:  Procedure Laterality Date  . ABDOMINAL HYSTERECTOMY Bilateral 03/20/2015   Procedure: HYSTERECTOMY ABDOMINAL, EXCISION MESENTERIC NODULE, BLADDER FLAP BIOPSY;  Surgeon: Ena Dawley, MD;  Location: [facility];  Service: Gynecology;  Laterality: Bilateral;  . APPENDECTOMY    . BILATERAL SALPINGECTOMY Bilateral 03/20/2015   Procedure: BILATERAL SALPINGECTOMY;  Surgeon: Ena Dawley, MD;  Location: [facility];  Service: Gynecology;  Laterality: Bilateral;  . COLONOSCOPY    . CYSTOSCOPY N/A 03/20/2015   Procedure: CYSTOSCOPY;  Surgeon: Ena Dawley, MD;  Location: [facility];   Service: Gynecology;  Laterality: N/A;  . LAPAROSCOPY N/A 03/20/2015   Procedure: LAPAROSCOPY DIAGNOSTIC;  Surgeon: Ena Dawley, MD;  Location: [facility];  Service: Gynecology;  Laterality: N/A;    OB History    No data available       Home Medications    Prior to Admission medications   Medication Sig Start Date End Date Taking? Authorizing Provider  benzonatate (TESSALON) 100 MG capsule Take 1 capsule (100 mg total) by mouth every 8 (eight) hours. 06/04/17   Emeline General, PA-C  cetirizine (ZYRTEC ALLERGY) 10 MG tablet Take 1 tablet (10 mg total) by mouth daily. 06/04/17   Avie Echevaria B, PA-C  cyanocobalamin (,VITAMIN B-12,) 1000 MCG/ML injection INJECT 1ML INTO MUSCLE ONCE A WEEK. INJECTION WEEKLY X3. THEM MONTHLY INJECTION. CHECK B12 LEVELS 11/05/16   Carollee Herter, Kendrick Fries R, DO  DilTIAZem HCl POWD Apply rectally bid 05/17/17   Carollee Herter, Alferd Apa, DO  docusate sodium (COLACE) 100 MG capsule Take 1 capsule (100 mg total) by mouth 2 (two) times daily. 03/22/15   Ena Dawley, MD  Fe Cbn-Fe Gluc-FA-B12-C-DSS [facility] 90) 90-1 MG TABS TAKE 1 TABLET BY MOUTH EVERY DAY 03/17/17   Carollee Herter, Alferd Apa, DO  fluticasone (FLONASE) 50 MCG/ACT nasal spray Place 1 spray into both nostrils daily. 06/04/17   Emeline General, PA-C  hydrochlorothiazide (HYDRODIURIL) 25 MG tablet Take 1 tablet (25 mg total) by mouth daily. 11/03/16   [name], DO  ibuprofen (ADVIL,MOTRIN) 800 MG tablet Take 1  tablet (800 mg total) by mouth every 8 (eight) hours as needed. 03/22/15   Ena Dawley, MD  pregabalin (LYRICA) 50 MG capsule Take 1 capsule (50 mg total) by mouth 3 (three) times daily. 11/03/16   [name], DO  Vitamin D, Ergocalciferol, (DRISDOL) 50000 units CAPS capsule TAKE 1 CAPSULE (50,000 UNITS TOTAL) BY MOUTH EVERY 7 (SEVEN) DAYS. 04/19/17   [name], DO    Family History Family History  Problem Relation Age of Onset  . Hypertension Mother   .  COPD Mother   . Cancer Maternal Uncle        colon  . Cancer Maternal Uncle        liver  . Hypertension Maternal Aunt   . Diabetes Maternal Aunt     Social History Social History   Tobacco Use  . Smoking status: Never Smoker  . Smokeless tobacco: Never Used  Substance Use Topics  . Alcohol use: Yes    Alcohol/week: 0.6 oz    Types: 1 Glasses of wine per week  . Drug use: No     Allergies   Patient has no known allergies.   Review of Systems Review of Systems  Constitutional: Negative for chills, diaphoresis and fever.  HENT: Positive for congestion, sinus pressure and sore throat. Negative for ear discharge, ear pain and facial swelling.   Eyes: Negative for photophobia, pain, redness and visual disturbance.  Respiratory: Positive for cough. Negative for choking, chest tightness, shortness of breath, wheezing and stridor.   Cardiovascular: Negative for chest pain and palpitations.  Gastrointestinal: Negative for abdominal distention, abdominal pain, diarrhea, nausea and vomiting.  Genitourinary: Negative for difficulty urinating, dysuria, flank pain, frequency and hematuria.  Musculoskeletal: Positive for myalgias. Negative for arthralgias, back pain, gait problem, joint swelling, neck pain and neck stiffness.  Skin: Negative for color change, pallor and rash.  Neurological: Positive for headaches. Negative for dizziness, seizures, syncope, weakness, light-headedness and numbness.     Physical Exam Updated Vital Signs BP (!) 155/99 (BP Location: Left Arm)   Pulse 65   Temp 98.3 F (36.8 C) (Oral)   Resp 16   Ht 5\' 7"  (1.702 m)   Wt 90.7 kg (200 lb)   LMP 02/14/2015 (Approximate)   SpO2 100%   BMI 31.32 kg/m   Physical Exam  Constitutional: She is oriented to person, place, and time. She appears well-developed and well-nourished. No distress.  Afebrile, nontoxic-appearing, sitting comfortably in chair in no acute distress.  HENT:  Head: Normocephalic and  atraumatic.  Right Ear: External ear normal.  Left Ear: External ear normal.  Nose: Nose normal.  Mouth/Throat: Oropharynx is clear and moist. No oropharyngeal exudate.  Normal right tympanic membrane.  Left ear canal with cerumen impaction.  Oropharynx is clear without exudate.  Eyes: Conjunctivae and EOM are normal. Pupils are equal, round, and reactive to light. Right eye exhibits no discharge. Left eye exhibits no discharge. No scleral icterus.  Neck: Normal range of motion. Neck supple.  Cardiovascular: Normal rate, regular rhythm and normal heart sounds.  No murmur heard. Pulmonary/Chest: Effort normal and breath sounds normal. No stridor. No respiratory distress. She has no wheezes. She has no rales. She exhibits no tenderness.  Abdominal: She exhibits no distension.  Musculoskeletal: Normal range of motion. She exhibits no edema.  Lymphadenopathy:    She has no cervical adenopathy.  Neurological: She is alert and oriented to person, place, and time. No cranial nerve deficit. She exhibits normal  muscle tone.  Skin: Skin is warm and dry. No rash noted. She is not diaphoretic. No erythema. No pallor.  Psychiatric: She has a normal mood and affect.  Nursing note and vitals reviewed.    ED Treatments / Results  Labs (all labs ordered are listed, but only abnormal results are displayed) Labs Reviewed - No data to display  EKG  EKG Interpretation None       Radiology No results found.  Procedures Procedures (including critical care time)  Medications Ordered in ED Medications - No data to display   Initial Impression / Assessment and Plan / ED Course  I have reviewed the triage vital signs and the nursing notes.  Pertinent labs & imaging results that were available during my care of the patient were reviewed by me and considered in my medical decision making (see chart for details).    Patients symptoms are consistent with URI, likely viral etiology. Discussed that  antibiotics are not indicated for viral infections. Pt will be discharged with symptomatic treatment.  Verbalizes understanding and is agreeable with plan. Pt is hemodynamically stable & in NAD prior to dc.  Discharge home with symptomatic relief and close follow-up with PCP.  Discussed return precautions and advised to return to the emergency department if experiencing any new or worsening symptoms. Instructions were understood and patient agreed with discharge plan.  Final Clinical Impressions(s) / ED Diagnoses   Final diagnoses:  Viral upper respiratory tract infection    ED Discharge Orders        Ordered    fluticasone (FLONASE) 50 MCG/ACT nasal spray  Daily     06/04/17 2031    cetirizine (ZYRTEC ALLERGY) 10 MG tablet  Daily     06/04/17 2031    benzonatate (TESSALON) 100 MG capsule  Every 8 hours     06/04/17 2031       Dossie Der 06/04/17 2038    Davonna Belling, MD 06/05/17 0025

## 2017-06-07 ENCOUNTER — Encounter: Payer: Self-pay | Admitting: Family Medicine

## 2017-06-07 ENCOUNTER — Ambulatory Visit: Payer: Self-pay | Admitting: Family Medicine

## 2017-06-07 VITALS — BP 122/80 | HR 90 | Temp 99.7°F | Resp 16 | Ht 67.0 in | Wt 201.6 lb

## 2017-06-07 DIAGNOSIS — J40 Bronchitis, not specified as acute or chronic: Secondary | ICD-10-CM

## 2017-06-07 DIAGNOSIS — J029 Acute pharyngitis, unspecified: Secondary | ICD-10-CM

## 2017-06-07 DIAGNOSIS — R52 Pain, unspecified: Secondary | ICD-10-CM

## 2017-06-07 LAB — POCT INFLUENZA A/B
INFLUENZA B, POC: NEGATIVE
Influenza A, POC: NEGATIVE

## 2017-06-07 LAB — POCT RAPID STREP A (OFFICE): RAPID STREP A SCREEN: NEGATIVE

## 2017-06-07 MED ORDER — PREDNISONE 20 MG PO TABS
40.0000 mg | ORAL_TABLET | Freq: Every day | ORAL | 0 refills | Status: DC
Start: 2017-06-07 — End: 2017-06-15

## 2017-06-07 MED ORDER — HYDROCOD POLST-CPM POLST ER 10-8 MG/5ML PO SUER: 5.0000 mL | Freq: Every evening | ORAL | 0 refills | Status: DC | PRN

## 2017-06-07 MED ORDER — AZITHROMYCIN 250 MG PO TABS: ORAL_TABLET | ORAL | 0 refills | Status: DC

## 2017-06-07 NOTE — Patient Instructions (Signed)

## 2017-06-07 NOTE — Progress Notes (Addendum)
Patient ID: Jo Compton, female   DOB: [birth year], 52 y.o.   MRN: [id number]    Subjective:  I acted as a Education administrator for Dr. Carollee Compton.  Jo Compton, Jo Compton   Patient ID: Jo Compton, female    DOB: [birth year], 52 y.o.   MRN: [id number]  Chief Complaint  Patient presents with  . Generalized Body Aches  . Cough    HPI  Patient is in today for cough, body aches.  She was seen in the ER on 06/04/17.  She has been taking tessalon perles, zyrtec, and nasal spray.  She feels worse.    + chills, bodyaches,  Low grade fever.  She was in the er Friday and given zyrtec, flonase and tessalon perles.  Chest congestion is worse.    Patient Care Team: Jo Compton, Jo Apa, DO as PCP - General (Family Medicine) Jo Dawley, MD as Consulting Physician (Obstetrics and Gynecology)   Past Medical History:  Diagnosis Date  . Anemia   . Depression   . Fistula    rectal  . OAB (overactive bladder)   . Rectal bleeding     Past Surgical History:  Procedure Laterality Date  . ABDOMINAL HYSTERECTOMY Bilateral 03/20/2015   Procedure: HYSTERECTOMY ABDOMINAL, EXCISION MESENTERIC NODULE, BLADDER FLAP BIOPSY;  Surgeon: Jo Dawley, MD;  Location: [facility];  Service: Gynecology;  Laterality: Bilateral;  . APPENDECTOMY    . BILATERAL SALPINGECTOMY Bilateral 03/20/2015   Procedure: BILATERAL SALPINGECTOMY;  Surgeon: Jo Dawley, MD;  Location: [facility];  Service: Gynecology;  Laterality: Bilateral;  . COLONOSCOPY    . CYSTOSCOPY N/A 03/20/2015   Procedure: CYSTOSCOPY;  Surgeon: Jo Dawley, MD;  Location: [facility];  Service: Gynecology;  Laterality: N/A;  . LAPAROSCOPY N/A 03/20/2015   Procedure: LAPAROSCOPY DIAGNOSTIC;  Surgeon: Jo Dawley, MD;  Location: [facility];  Service: Gynecology;  Laterality: N/A;    Family History  Problem Relation Age of Onset  . Hypertension Mother   . COPD Mother   . Cancer Maternal Uncle        colon  . Cancer Maternal Uncle        liver  . Hypertension Maternal  Aunt   . Diabetes Maternal Aunt     Social History   Socioeconomic History  . Marital status: Divorced    Spouse name: Not on file  . Number of children: Not on file  . Years of education: Not on file  . Highest education level: Not on file  Social Needs  . Financial resource strain: Not on file  . Food insecurity - worry: Not on file  . Food insecurity - inability: Not on file  . Transportation needs - medical: Not on file  . Transportation needs - non-medical: Not on file  Occupational History  . Occupation: [employer] at [employer] Use  . Smoking status: Never Smoker  . Smokeless tobacco: Never Used  Substance and Sexual Activity  . Alcohol use: Yes    Alcohol/week: 0.6 oz    Types: 1 Glasses of wine per week  . Drug use: No  . Sexual activity: Yes    Birth control/protection: None  Other Topics Concern  . Not on file  Social History Narrative   ** Merged History Encounter **        Outpatient Medications Prior to Visit  Medication Sig Dispense Refill  . benzonatate (TESSALON) 100 MG capsule Take 1 capsule (100 mg total) by mouth every 8 (eight) hours. 21 capsule 0  .  cetirizine (ZYRTEC ALLERGY) 10 MG tablet Take 1 tablet (10 mg total) by mouth daily. 30 tablet 0  . cyanocobalamin (,VITAMIN B-12,) 1000 MCG/ML injection INJECT 1ML INTO MUSCLE ONCE A WEEK. INJECTION WEEKLY X3. THEM MONTHLY INJECTION. CHECK B12 LEVELS 3 mL 0  . DilTIAZem HCl POWD Apply rectally bid 100 g 0  . docusate sodium (COLACE) 100 MG capsule Take 1 capsule (100 mg total) by mouth 2 (two) times daily. 120 capsule 2  . Fe Cbn-Fe Gluc-FA-B12-C-DSS (FERRALET 90) 90-1 MG TABS TAKE 1 TABLET BY MOUTH EVERY DAY 30 each 2  . fluticasone (FLONASE) 50 MCG/ACT nasal spray Place 1 spray into both nostrils daily. 16 g 2  . hydrochlorothiazide (HYDRODIURIL) 25 MG tablet Take 1 tablet (25 mg total) by mouth daily. 30 tablet 2  . ibuprofen (ADVIL,MOTRIN) 800 MG tablet Take 1 tablet (800 mg  total) by mouth every 8 (eight) hours as needed. 50 tablet 1  . pregabalin (LYRICA) 50 MG capsule Take 1 capsule (50 mg total) by mouth 3 (three) times daily. 30 capsule 1  . Vitamin D, Ergocalciferol, (DRISDOL) 50000 units CAPS capsule TAKE 1 CAPSULE (50,000 UNITS TOTAL) BY MOUTH EVERY 7 (SEVEN) DAYS. 12 capsule 2   No facility-administered medications prior to visit.     No Known Allergies  Review of Systems  Constitutional: Positive for chills. Negative for fever and malaise/fatigue.  HENT: Positive for congestion, ear pain (left ear), sinus pain and sore throat.   Eyes: Negative for blurred vision.  Respiratory: Positive for cough. Negative for shortness of breath.   Cardiovascular: Negative for chest pain, palpitations and leg swelling.  Gastrointestinal: Negative for vomiting.  Musculoskeletal: Negative for back pain.  Skin: Negative for rash.  Neurological: Positive for weakness. Negative for loss of consciousness and headaches.       Objective:    Physical Exam  Constitutional: She is oriented to person, place, and time. She appears well-developed and well-nourished.  HENT:  Right Ear: External ear normal.  Left Ear: External ear normal.  + PND + errythema  Eyes: Conjunctivae are normal. Right eye exhibits no discharge. Left eye exhibits no discharge.  Cardiovascular: Normal rate, regular rhythm and normal heart sounds.  No murmur heard. Pulmonary/Chest: Effort normal. No respiratory distress. She has decreased breath sounds. She has no wheezes. She has no rales. She exhibits no tenderness.  Musculoskeletal: She exhibits no edema.  Lymphadenopathy:    She has cervical adenopathy.  Neurological: She is alert and oriented to person, place, and time.  Nursing note and vitals reviewed.   BP 122/80 (BP Location: Right Arm, Cuff Size: Normal)   Pulse 90   Temp 99.7 F (37.6 C) (Oral)   Resp 16   Ht 5\' 7"  (1.702 m)   Wt 201 lb 9.6 oz (91.4 kg)   LMP 02/14/2015  (Approximate)   SpO2 98%   BMI 31.58 kg/m  Wt Readings from Last 3 Encounters:  06/07/17 201 lb 9.6 oz (91.4 kg)  06/04/17 200 lb (90.7 kg)  04/15/17 202 lb (91.6 kg)   BP Readings from Last 3 Encounters:  06/07/17 122/80  06/04/17 (!) 158/87  04/15/17 122/82     Immunization History  Administered Date(s) Administered  . Tdap 08/04/2010    Health Maintenance  Topic Date Due  . HIV Screening  10/27/1982  . COLONOSCOPY  05/28/2016  . INFLUENZA VACCINE  11/25/2016  . PAP SMEAR  11/08/2017  . TETANUS/TDAP  08/03/2020    Lab Results  Component Value  Date   WBC 5.8 06/16/2016   HGB 13.4 06/16/2016   HCT 40.6 06/16/2016   PLT 247.0 06/16/2016   GLUCOSE 84 06/16/2016   CHOL 160 06/07/2012   TRIG 101.0 06/07/2012   HDL 49.10 06/07/2012   LDLCALC 91 06/07/2012   ALT 17 06/16/2016   AST 17 06/16/2016   NA 138 06/16/2016   K 3.9 06/16/2016   CL 105 06/16/2016   CREATININE 0.90 06/16/2016   BUN 13 06/16/2016   CO2 27 06/16/2016   TSH 0.44 06/16/2016   HGBA1C 5.5 01/09/2013   MICROALBUR 0.1 06/07/2012    Lab Results  Component Value Date   TSH 0.44 06/16/2016   Lab Results  Component Value Date   WBC 5.8 06/16/2016   HGB 13.4 06/16/2016   HCT 40.6 06/16/2016   MCV 78.5 06/16/2016   PLT 247.0 06/16/2016   Lab Results  Component Value Date   NA 138 06/16/2016   K 3.9 06/16/2016   CO2 27 06/16/2016   GLUCOSE 84 06/16/2016   BUN 13 06/16/2016   CREATININE 0.90 06/16/2016   BILITOT 0.3 06/16/2016   ALKPHOS 60 06/16/2016   AST 17 06/16/2016   ALT 17 06/16/2016   PROT 6.7 06/16/2016   ALBUMIN 3.9 06/16/2016   CALCIUM 9.4 06/16/2016   ANIONGAP 4 (L) 03/17/2015   GFR 85.71 06/16/2016   Lab Results  Component Value Date   CHOL 160 06/07/2012   Lab Results  Component Value Date   HDL 49.10 06/07/2012   Lab Results  Component Value Date   LDLCALC 91 06/07/2012   Lab Results  Component Value Date   TRIG 101.0 06/07/2012   Lab Results  Component  Value Date   CHOLHDL 3 06/07/2012   Lab Results  Component Value Date   HGBA1C 5.5 01/09/2013         Assessment & Plan:   Problem List Items Addressed This Visit    None    Visit Diagnoses    Generalized body aches    -  Primary   Relevant Orders   POCT Influenza A/B (Completed)   Sore throat       Relevant Medications   predniSONE (DELTASONE) 20 MG tablet   Other Relevant Orders   POCT rapid strep A (Completed)   Culture, Group A Strep   Bronchitis       Relevant Medications   azithromycin (ZITHROMAX Z-PAK) 250 MG tablet   predniSONE (DELTASONE) 20 MG tablet   chlorpheniramine-HYDROcodone (TUSSIONEX PENNKINETIC ER) 10-8 MG/5ML SUER      I am having Jo Compton. Powell start on azithromycin, predniSONE, and chlorpheniramine-HYDROcodone. I am also having her maintain her ibuprofen, docusate sodium, pregabalin, hydrochlorothiazide, cyanocobalamin, FERRALET 90, Vitamin D (Ergocalciferol), DilTIAZem HCl, fluticasone, cetirizine, and benzonatate.  Meds ordered this encounter  Medications  . azithromycin (ZITHROMAX Z-PAK) 250 MG tablet    Sig: As directed    Dispense:  6 each    Refill:  0  . predniSONE (DELTASONE) 20 MG tablet    Sig: Take 2 tablets (40 mg total) by mouth daily.    Dispense:  10 tablet    Refill:  0  . chlorpheniramine-HYDROcodone (TUSSIONEX PENNKINETIC ER) 10-8 MG/5ML SUER    Sig: Take 5 mLs by mouth at bedtime as needed for cough.    Dispense:  115 mL    Refill:  0    CMA served as scribe during this visit. History, Physical and Plan performed by medical provider. Documentation and orders reviewed  and attested to.  [name], DO

## 2017-06-09 LAB — CULTURE, GROUP A STREP
MICRO NUMBER: 90179979
SPECIMEN QUALITY:: ADEQUATE

## 2017-06-10 ENCOUNTER — Encounter: Payer: Self-pay | Admitting: *Deleted

## 2017-06-10 ENCOUNTER — Telehealth: Payer: Self-pay | Admitting: Family Medicine

## 2017-06-10 NOTE — Telephone Encounter (Signed)
Copied from [place]. Topic: Quick Communication - See Telephone Encounter >> Jun 10, 2017  9:26 AM Robina Ade, Helene Kelp D wrote: CRM for notification. See Telephone encounter for: 06/10/17. Patient called and said that she is still not feeling well and would like for Dr. Etter Sjogren could extender her work note for Monday is possible. She also would like to know is she can take a high doze of her cough medication. Please call patient back, thanks.

## 2017-06-10 NOTE — Telephone Encounter (Signed)
Try mucinex , mucinex dm or delsym

## 2017-06-10 NOTE — Telephone Encounter (Signed)
Patient notified

## 2017-06-10 NOTE — Telephone Encounter (Signed)
Patient came into office for note.  Note given.  She states that her cough is bad during the day.  She has been taking the benzonatate 1 cap every 8 hours as needed for cough.  At night she has been taking the tussionex which has helped.  Any recommendations on what she can do for the daytime cough?

## 2017-06-15 ENCOUNTER — Ambulatory Visit: Payer: No Typology Code available for payment source | Admitting: Family Medicine

## 2017-06-15 ENCOUNTER — Encounter: Payer: Self-pay | Admitting: Family Medicine

## 2017-06-15 ENCOUNTER — Ambulatory Visit (HOSPITAL_BASED_OUTPATIENT_CLINIC_OR_DEPARTMENT_OTHER)
Admission: RE | Admit: 2017-06-15 | Discharge: 2017-06-15 | Disposition: A | Discharge status: Home / Self Care | Payer: No Typology Code available for payment source | Source: Ambulatory Visit | Attending: Family Medicine | Admitting: Family Medicine

## 2017-06-15 VITALS — BP 120/80 | HR 65 | Temp 98.0°F | Resp 16 | Ht 67.0 in | Wt 203.2 lb

## 2017-06-15 DIAGNOSIS — J9801 Acute bronchospasm: Secondary | ICD-10-CM

## 2017-06-15 DIAGNOSIS — R05 Cough: Secondary | ICD-10-CM | POA: Insufficient documentation

## 2017-06-15 DIAGNOSIS — R059 Cough, unspecified: Secondary | ICD-10-CM

## 2017-06-15 MED ORDER — FLUTICASONE PROPIONATE HFA 110 MCG/ACT IN AERO: 2.0000 | INHALATION_SPRAY | Freq: Two times a day (BID) | RESPIRATORY_TRACT | 12 refills | Status: DC

## 2017-06-15 MED ORDER — MONTELUKAST SODIUM 10 MG PO TABS
10.0000 mg | ORAL_TABLET | Freq: Every day | ORAL | 3 refills | Status: DC
Start: 2017-06-15 — End: 2018-01-07

## 2017-06-15 NOTE — Progress Notes (Signed)
Patient ID: Jo Compton, female   DOB: 09/22/1965, 52 y.o.   MRN: [id number]     Subjective:  I acted as a Education administrator for Dr. Carollee Herter.  Jo Compton, [place]   Patient ID: Jo Compton, female    DOB: 30-Mar-1966, 52 y.o.   MRN: [id number]  Chief Complaint  Patient presents with  . follow up cough    HPI  Patient is in today for follow up cough.  Cough is still bad.  She has a headache and runny nose.  Patient Care Team: Carollee Herter, Alferd Apa, DO as PCP - General (Family Medicine) Ena Dawley, MD as Consulting Physician (Obstetrics and Gynecology)   Past Medical History:  Diagnosis Date  . Anemia   . Depression   . Fistula    rectal  . OAB (overactive bladder)   . Rectal bleeding     Past Surgical History:  Procedure Laterality Date  . ABDOMINAL HYSTERECTOMY Bilateral 03/20/2015   Procedure: HYSTERECTOMY ABDOMINAL, EXCISION MESENTERIC NODULE, BLADDER FLAP BIOPSY;  Surgeon: Ena Dawley, MD;  Location: [facility];  Service: Gynecology;  Laterality: Bilateral;  . APPENDECTOMY    . BILATERAL SALPINGECTOMY Bilateral 03/20/2015   Procedure: BILATERAL SALPINGECTOMY;  Surgeon: Ena Dawley, MD;  Location: [facility];  Service: Gynecology;  Laterality: Bilateral;  . COLONOSCOPY    . CYSTOSCOPY N/A 03/20/2015   Procedure: CYSTOSCOPY;  Surgeon: Ena Dawley, MD;  Location: [facility];  Service: Gynecology;  Laterality: N/A;  . LAPAROSCOPY N/A 03/20/2015   Procedure: LAPAROSCOPY DIAGNOSTIC;  Surgeon: Ena Dawley, MD;  Location: [facility];  Service: Gynecology;  Laterality: N/A;    Family History  Problem Relation Age of Onset  . Hypertension Mother   . COPD Mother   . Cancer Maternal Uncle        colon  . Cancer Maternal Uncle        liver  . Hypertension Maternal Aunt   . Diabetes Maternal Aunt     Social History   Socioeconomic History  . Marital status: Divorced    Spouse name: Not on file  . Number of children: Not on file  . Years of education: Not on file  .  Highest education level: Not on file  Social Needs  . Financial resource strain: Not on file  . Food insecurity - worry: Not on file  . Food insecurity - inability: Not on file  . Transportation needs - medical: Not on file  . Transportation needs - non-medical: Not on file  Occupational History  . Occupation: [employer] Use  . Smoking status: Never Smoker  . Smokeless tobacco: Never Used  Substance and Sexual Activity  . Alcohol use: Yes    Alcohol/week: 0.6 oz    Types: 1 Glasses of wine per week  . Drug use: No  . Sexual activity: Yes    Birth control/protection: None  Other Topics Concern  . Not on file  Social History Narrative   ** Merged History Encounter **        Outpatient Medications Prior to Visit  Medication Sig Dispense Refill  . benzonatate (TESSALON) 100 MG capsule Take 1 capsule (100 mg total) by mouth every 8 (eight) hours. 21 capsule 0  . chlorpheniramine-HYDROcodone (TUSSIONEX PENNKINETIC ER) 10-8 MG/5ML SUER Take 5 mLs by mouth at bedtime as needed for cough. 115 mL 0  . cyanocobalamin (,VITAMIN B-12,) 1000 MCG/ML injection INJECT 1ML INTO MUSCLE ONCE A WEEK. INJECTION WEEKLY X3. THEM MONTHLY INJECTION. CHECK  B12 LEVELS 3 mL 0  . DilTIAZem HCl POWD Apply rectally bid 100 g 0  . docusate sodium (COLACE) 100 MG capsule Take 1 capsule (100 mg total) by mouth 2 (two) times daily. 120 capsule 2  . Fe Cbn-Fe Gluc-FA-B12-C-DSS (FERRALET 90) 90-1 MG TABS TAKE 1 TABLET BY MOUTH EVERY DAY 30 each 2  . fluticasone (FLONASE) 50 MCG/ACT nasal spray Place 1 spray into both nostrils daily. 16 g 2  . hydrochlorothiazide (HYDRODIURIL) 25 MG tablet Take 1 tablet (25 mg total) by mouth daily. 30 tablet 2  . ibuprofen (ADVIL,MOTRIN) 800 MG tablet Take 1 tablet (800 mg total) by mouth every 8 (eight) hours as needed. 50 tablet 1  . pregabalin (LYRICA) 50 MG capsule Take 1 capsule (50 mg total) by mouth 3 (three) times daily. 30 capsule 1  .  Vitamin D, Ergocalciferol, (DRISDOL) 50000 units CAPS capsule TAKE 1 CAPSULE (50,000 UNITS TOTAL) BY MOUTH EVERY 7 (SEVEN) DAYS. 12 capsule 2  . azithromycin (ZITHROMAX Z-PAK) 250 MG tablet As directed 6 each 0  . cetirizine (ZYRTEC ALLERGY) 10 MG tablet Take 1 tablet (10 mg total) by mouth daily. 30 tablet 0  . predniSONE (DELTASONE) 20 MG tablet Take 2 tablets (40 mg total) by mouth daily. 10 tablet 0   No facility-administered medications prior to visit.     No Known Allergies  Review of Systems  Constitutional: Negative for fever and malaise/fatigue.  HENT: Negative for congestion.        Runny nose  Eyes: Negative for blurred vision.  Respiratory: Positive for cough. Negative for shortness of breath.   Cardiovascular: Negative for chest pain, palpitations and leg swelling.  Gastrointestinal: Negative for vomiting.  Musculoskeletal: Negative for back pain.  Skin: Negative for rash.  Neurological: Positive for headaches. Negative for loss of consciousness.       Objective:    Physical Exam  Constitutional: She is oriented to person, place, and time. She appears well-developed and well-nourished.  HENT:  Right Ear: External ear normal.  Left Ear: External ear normal.  + PND + errythema  Eyes: Conjunctivae are normal. Right eye exhibits no discharge. Left eye exhibits no discharge.  Cardiovascular: Normal rate, regular rhythm and normal heart sounds.  No murmur heard. Pulmonary/Chest: Effort normal and breath sounds normal. No respiratory distress. She has no wheezes. She has no rales. She exhibits no tenderness.  Musculoskeletal: She exhibits no edema.  Lymphadenopathy:    She has cervical adenopathy.  Neurological: She is alert and oriented to person, place, and time.  Nursing note and vitals reviewed.   BP 120/80 (BP Location: Left Arm, Cuff Size: Large)   Pulse 65   Temp 98 F (36.7 C) (Oral)   Resp 16   Ht 5\' 7"  (1.702 m)   Wt 203 lb 3.2 oz (92.2 kg)   LMP  02/14/2015 (Approximate)   SpO2 98%   BMI 31.83 kg/m  Wt Readings from Last 3 Encounters:  06/15/17 203 lb 3.2 oz (92.2 kg)  06/07/17 201 lb 9.6 oz (91.4 kg)  06/04/17 200 lb (90.7 kg)   BP Readings from Last 3 Encounters:  06/15/17 120/80  06/07/17 122/80  06/04/17 (!) 158/87     Immunization History  Administered Date(s) Administered  . Tdap 08/04/2010    Health Maintenance  Topic Date Due  . HIV Screening  10/27/1982  . COLONOSCOPY  05/28/2016  . INFLUENZA VACCINE  11/25/2016  . PAP SMEAR  11/08/2017  . TETANUS/TDAP  08/03/2020  Lab Results  Component Value Date   WBC 5.8 06/16/2016   HGB 13.4 06/16/2016   HCT 40.6 06/16/2016   PLT 247.0 06/16/2016   GLUCOSE 84 06/16/2016   CHOL 160 06/07/2012   TRIG 101.0 06/07/2012   HDL 49.10 06/07/2012   LDLCALC 91 06/07/2012   ALT 17 06/16/2016   AST 17 06/16/2016   NA 138 06/16/2016   K 3.9 06/16/2016   CL 105 06/16/2016   CREATININE 0.90 06/16/2016   BUN 13 06/16/2016   CO2 27 06/16/2016   TSH 0.44 06/16/2016   HGBA1C 5.5 01/09/2013   MICROALBUR 0.1 06/07/2012    Lab Results  Component Value Date   TSH 0.44 06/16/2016   Lab Results  Component Value Date   WBC 5.8 06/16/2016   HGB 13.4 06/16/2016   HCT 40.6 06/16/2016   MCV 78.5 06/16/2016   PLT 247.0 06/16/2016   Lab Results  Component Value Date   NA 138 06/16/2016   K 3.9 06/16/2016   CO2 27 06/16/2016   GLUCOSE 84 06/16/2016   BUN 13 06/16/2016   CREATININE 0.90 06/16/2016   BILITOT 0.3 06/16/2016   ALKPHOS 60 06/16/2016   AST 17 06/16/2016   ALT 17 06/16/2016   PROT 6.7 06/16/2016   ALBUMIN 3.9 06/16/2016   CALCIUM 9.4 06/16/2016   ANIONGAP 4 (L) 03/17/2015   GFR 85.71 06/16/2016   Lab Results  Component Value Date   CHOL 160 06/07/2012   Lab Results  Component Value Date   HDL 49.10 06/07/2012   Lab Results  Component Value Date   LDLCALC 91 06/07/2012   Lab Results  Component Value Date   TRIG 101.0 06/07/2012   Lab  Results  Component Value Date   CHOLHDL 3 06/07/2012   Lab Results  Component Value Date   HGBA1C 5.5 01/09/2013         Assessment & Plan:   Problem List Items Addressed This Visit    None    Visit Diagnoses    Cough    -  Primary   Relevant Medications   montelukast (SINGULAIR) 10 MG tablet   fluticasone (FLOVENT HFA) 110 MCG/ACT inhaler   Other Relevant Orders   DG Chest 2 View (Completed)   Bronchospasm       Relevant Medications   montelukast (SINGULAIR) 10 MG tablet   fluticasone (FLOVENT HFA) 110 MCG/ACT inhaler      I have discontinued Amado Nash. Powell's cetirizine, azithromycin, and predniSONE. I am also having her start on montelukast and fluticasone. Additionally, I am having her maintain her ibuprofen, docusate sodium, pregabalin, hydrochlorothiazide, cyanocobalamin, FERRALET 90, Vitamin D (Ergocalciferol), DilTIAZem HCl, fluticasone, benzonatate, and chlorpheniramine-HYDROcodone.  Meds ordered this encounter  Medications  . montelukast (SINGULAIR) 10 MG tablet    Sig: Take 1 tablet (10 mg total) by mouth at bedtime.    Dispense:  30 tablet    Refill:  3  . fluticasone (FLOVENT HFA) 110 MCG/ACT inhaler    Sig: Inhale 2 puffs into the lungs 2 (two) times daily.    Dispense:  1 Inhaler    Refill:  12    CMA served as scribe during this visit. History, Physical and Plan performed by medical provider. Documentation and orders reviewed and attested to.   [name], DO

## 2017-06-15 NOTE — Patient Instructions (Signed)
Cough, Adult  Coughing is a reflex that clears your throat and your airways. Coughing helps to heal and protect your lungs. It is normal to cough occasionally, but a cough that happens with other symptoms or lasts a long time may be a sign of a condition that needs treatment. A cough may last only 2-3 weeks (acute), or it may last longer than 8 weeks (chronic).  What are the causes?  Coughing is commonly caused by:   Breathing in substances that irritate your lungs.   A viral or bacterial respiratory infection.   Allergies.   Asthma.   Postnasal drip.   Smoking.   Acid backing up from the stomach into the esophagus (gastroesophageal reflux).   Certain medicines.   Chronic lung problems, including COPD (or rarely, lung cancer).   Other medical conditions such as heart failure.    Follow these instructions at home:  Pay attention to any changes in your symptoms. Take these actions to help with your discomfort:   Take medicines only as told by your health care provider.  ? If you were prescribed an antibiotic medicine, take it as told by your health care provider. Do not stop taking the antibiotic even if you start to feel better.  ? Talk with your health care provider before you take a cough suppressant medicine.   Drink enough fluid to keep your urine clear or pale yellow.   If the air is dry, use a cold steam vaporizer or humidifier in your bedroom or your home to help loosen secretions.   Avoid anything that causes you to cough at work or at home.   If your cough is worse at night, try sleeping in a semi-upright position.   Avoid cigarette smoke. If you smoke, quit smoking. If you need help quitting, ask your health care provider.   Avoid caffeine.   Avoid alcohol.   Rest as needed.    Contact a health care provider if:   You have new symptoms.   You cough up pus.   Your cough does not get better after 2-3 weeks, or your cough gets worse.   You cannot control your cough with suppressant  medicines and you are losing sleep.   You develop pain that is getting worse or pain that is not controlled with pain medicines.   You have a fever.   You have unexplained weight loss.   You have night sweats.  Get help right away if:   You cough up blood.   You have difficulty breathing.   Your heartbeat is very fast.  This information is not intended to replace advice given to you by your health care provider. Make sure you discuss any questions you have with your health care provider.  Document Released: 10/10/2010 Document Revised: 09/19/2015 Document Reviewed: 06/20/2014  Elsevier Interactive Patient Education  2018 Elsevier Inc.

## 2017-08-22 ENCOUNTER — Other Ambulatory Visit: Payer: Self-pay | Admitting: Family Medicine

## 2017-12-20 ENCOUNTER — Other Ambulatory Visit: Payer: Self-pay | Admitting: Family Medicine

## 2017-12-23 ENCOUNTER — Ambulatory Visit: Payer: No Typology Code available for payment source | Admitting: Family Medicine

## 2017-12-31 ENCOUNTER — Ambulatory Visit: Payer: No Typology Code available for payment source | Admitting: Family Medicine

## 2017-12-31 VITALS — BP 120/88 | HR 78 | Temp 98.0°F | Resp 16 | Ht 67.0 in | Wt 205.0 lb

## 2017-12-31 DIAGNOSIS — L918 Other hypertrophic disorders of the skin: Secondary | ICD-10-CM

## 2017-12-31 MED ORDER — IBUPROFEN 600 MG PO TABS: 600.0000 mg | ORAL_TABLET | Freq: Three times a day (TID) | ORAL | 0 refills | Status: DC | PRN

## 2017-12-31 NOTE — Progress Notes (Signed)
Skin Tag Removal Procedure Note  Pre-operative Diagnosis: Classic skin tags (acrochordon)  Post-operative Diagnosis: Classic skin tags (acrochordon)  Locations:anterior chest  Indications: irritated skin tag  Anesthesia: Lidocaine 2% with epinephrine without added sodium bicarbonate  Procedure Details  The risks (including bleeding and infection) and benefits of the procedure and Written informed consent obtained. Using sterile iris scissors, multiple skin tags were snipped off at their bases after cleansing with Betadine.  Bleeding was controlled by pressure-- and silver nitrate stick  Findings: Pathognomonic benign lesions  not sent for pathological exam.  Condition: Stable  Complications: none.  Plan: 1. Instructed to keep the wounds dry and covered for 24-48h and clean thereafter. 2. Warning signs of infection were reviewed.   3. Recommended that the patient use OTC acetaminophen and OTC analgesics as needed for pain.  4. Return as needed.

## 2017-12-31 NOTE — Patient Instructions (Signed)
Skin Tag, Adult A skin tag (acrochordon) is a soft, extra growth of skin. Most skin tags are flesh-colored and rarely bigger than a pencil eraser. They commonly form near areas where there are folds in the skin, such as the armpit or groin. Skin tags are not dangerous, and they do not spread from person to person (are not contagious). You may have one skin tag or several. Skin tags do not require treatment. However, your health care provider may recommend removal of a skin tag if it:  Gets irritated from clothing.  Bleeds.  Is visible and unsightly.  Your health care provider can remove skin tags with a simple surgical procedure or a procedure that involves freezing the skin tag. Follow these instructions at home:  Watch for any changes in your skin tag. A normal skin tag does not require any other special care at home.  Take over-the-counter and prescription medicines only as told by your health care provider.  Keep all follow-up visits as told by your health care provider. This is important. Contact a health care provider if:  You have a skin tag that: ? Becomes painful. ? Changes color. ? Bleeds. ? Swells.  You develop more skin tags. This information is not intended to replace advice given to you by your health care provider. Make sure you discuss any questions you have with your health care provider. Document Released: 04/28/2015 Document Revised: 12/08/2015 Document Reviewed: 04/28/2015 Elsevier Interactive Patient Education  2018 Elsevier Inc.  

## 2018-01-07 ENCOUNTER — Encounter: Payer: Self-pay | Admitting: Family Medicine

## 2018-01-07 ENCOUNTER — Ambulatory Visit: Payer: No Typology Code available for payment source | Admitting: Family Medicine

## 2018-01-07 VITALS — BP 126/80 | HR 64 | Temp 98.3°F | Resp 16 | Ht 67.0 in | Wt 208.8 lb

## 2018-01-07 DIAGNOSIS — L089 Local infection of the skin and subcutaneous tissue, unspecified: Secondary | ICD-10-CM

## 2018-01-07 DIAGNOSIS — E538 Deficiency of other specified B group vitamins: Secondary | ICD-10-CM

## 2018-01-07 DIAGNOSIS — T148XXA Other injury of unspecified body region, initial encounter: Secondary | ICD-10-CM

## 2018-01-07 NOTE — Patient Instructions (Signed)
Vitamin B12 Deficiency Vitamin B12 deficiency means that your body is not getting enough vitamin B12. Your body needs vitamin B12 for important bodily functions. If you do not have enough vitamin B12 in your body, you can have health problems. Follow these instructions at home:  Take supplements only as told by your doctor. Follow the directions carefully.  Get any shots (injections) as told by your doctor. Do not miss your visits to the doctor.  Eat lots of healthy foods that contain vitamin B12. Ask your doctor if you should work with someone who is trained in how food affects health (dietitian). Foods that contain vitamin B12 include: ? Meat. ? Meat from birds (poultry). ? Fish. ? Eggs. ? Cereal and dairy products that are fortified. This means that vitamin B12 has been added to the food. Check the label on the package to see if the food is fortified.  Do not drink too much (do not abuse) alcohol.  Keep all follow-up visits as told by your doctor. This is important. Contact a doctor if:  Your symptoms come back. Get help right away if:  You have trouble breathing.  You have chest pain.  You get dizzy.  You pass out (lose consciousness). This information is not intended to replace advice given to you by your health care provider. Make sure you discuss any questions you have with your health care provider. Document Released: 04/02/2011 Document Revised: 09/19/2015 Document Reviewed: 08/29/2014 Elsevier Interactive Patient Education  2018 Elsevier Inc.  

## 2018-01-07 NOTE — Progress Notes (Signed)
Patient ID: Jo Compton, female    DOB: 1966/04/06  Age: 52 y.o. MRN: [id number]    Subjective:  Subjective  HPI [name] presents for f/u from skin tag removal.  It does not look normal to her and is tender.    Review of Systems  Constitutional: Negative for appetite change, diaphoresis, fatigue and unexpected weight change.  Eyes: Negative for pain, redness and visual disturbance.  Respiratory: Negative for cough, chest tightness, shortness of breath and wheezing.   Cardiovascular: Negative for chest pain, palpitations and leg swelling.  Endocrine: Negative for cold intolerance, heat intolerance, polydipsia, polyphagia and polyuria.  Genitourinary: Negative for difficulty urinating, dysuria and frequency.  Neurological: Negative for dizziness, light-headedness, numbness and headaches.    History Past Medical History:  Diagnosis Date  . Anemia   . Depression   . Fistula    rectal  . OAB (overactive bladder)   . Rectal bleeding     She has a past surgical history that includes Appendectomy; Colonoscopy; Abdominal hysterectomy (Bilateral, 03/20/2015); laparoscopy (N/A, 03/20/2015); Bilateral salpingectomy (Bilateral, 03/20/2015); and Cystoscopy (N/A, 03/20/2015).   Her family history includes COPD in her mother; Cancer in her maternal uncle and maternal uncle; Diabetes in her maternal aunt; Hypertension in her maternal aunt and mother.She reports that she has never smoked. She has never used smokeless tobacco. She reports that she drinks about 1.0 standard drinks of alcohol per week. She reports that she does not use drugs.  Current Outpatient Medications on File Prior to Visit  Medication Sig Dispense Refill  . docusate sodium (COLACE) 100 MG capsule Take 1 capsule (100 mg total) by mouth 2 (two) times daily. 120 capsule 2  . ibuprofen (ADVIL,MOTRIN) 600 MG tablet Take 1 tablet (600 mg total) by mouth every 8 (eight) hours as needed. 30 tablet 0   No current  facility-administered medications on file prior to visit.      Objective:  Objective  Physical Exam  Skin:     Nursing note and vitals reviewed.  BP 126/80 (BP Location: Left Arm, Patient Position: Sitting, Cuff Size: Small)   Pulse 64   Temp 98.3 F (36.8 C) (Oral)   Resp 16   Ht 5\' 7"  (1.702 m)   Wt 208 lb 12.8 oz (94.7 kg)   LMP 02/14/2015 (Approximate)   SpO2 97%   BMI 32.70 kg/m  Wt Readings from Last 3 Encounters:  01/07/18 208 lb 12.8 oz (94.7 kg)  12/31/17 205 lb (93 kg)  06/15/17 203 lb 3.2 oz (92.2 kg)     Lab Results  Component Value Date   WBC 5.8 06/16/2016   HGB 13.4 06/16/2016   HCT 40.6 06/16/2016   PLT 247.0 06/16/2016   GLUCOSE 84 06/16/2016   CHOL 160 06/07/2012   TRIG 101.0 06/07/2012   HDL 49.10 06/07/2012   LDLCALC 91 06/07/2012   ALT 17 06/16/2016   AST 17 06/16/2016   NA 138 06/16/2016   K 3.9 06/16/2016   CL 105 06/16/2016   CREATININE 0.90 06/16/2016   BUN 13 06/16/2016   CO2 27 06/16/2016   TSH 0.44 06/16/2016   HGBA1C 5.5 01/09/2013   MICROALBUR 0.1 06/07/2012    Dg Chest 2 View  Result Date: 06/15/2017 CLINICAL DATA:  Cough. EXAM: CHEST  2 VIEW COMPARISON:  Radiographs of Sep 19, 2011. FINDINGS: The heart size and mediastinal contours are within normal limits. Both lungs are clear. No pneumothorax or pleural effusion is noted. The visualized skeletal structures are  unremarkable. IMPRESSION: No active cardiopulmonary disease. Electronically Signed   By: Marijo Compton, M.D.   On: 06/15/2017 15:33     Assessment & Plan:  Plan  I have discontinued Amado Nash. Powell's pregabalin, cyanocobalamin, FERRALET 90, Vitamin D (Ergocalciferol), dilTIAZem HCl, fluticasone, montelukast, fluticasone, and hydrochlorothiazide. I am also having her maintain her docusate sodium and ibuprofen.  No orders of the defined types were placed in this encounter.   Problem List Items Addressed This Visit      Unprioritized   Wound infection - Primary     abx ointment  If no better consider systemic abx / referral to skin surgery center       Other Visit Diagnoses    B12 deficiency          Follow-up: No follow-ups on file.  [name], DO

## 2018-01-08 DIAGNOSIS — T148XXA Other injury of unspecified body region, initial encounter: Principal | ICD-10-CM

## 2018-01-08 DIAGNOSIS — L089 Local infection of the skin and subcutaneous tissue, unspecified: Secondary | ICD-10-CM | POA: Insufficient documentation

## 2018-01-08 NOTE — Assessment & Plan Note (Signed)
abx ointment  If no better consider systemic abx / referral to skin surgery center

## 2018-02-22 ENCOUNTER — Other Ambulatory Visit: Payer: Self-pay | Admitting: Family Medicine

## 2018-02-22 ENCOUNTER — Telehealth: Payer: Self-pay | Admitting: Family Medicine

## 2018-02-22 NOTE — Telephone Encounter (Signed)
Patient requests in a telephone encounter that she would like 800mg  are you ok with that strength?

## 2018-02-22 NOTE — Telephone Encounter (Signed)
Noted that Rx for Ibuprofen 600 mg is already pending.  Pt. Is requesting a dosage change to 800 mg.

## 2018-02-22 NOTE — Telephone Encounter (Signed)
See refill note from today.

## 2018-02-22 NOTE — Telephone Encounter (Signed)
Copied from [id number] [id number]. Topic: Quick Communication - Rx Refill/Question >> Feb 22, 2018 11:44 AM Celedonio Savage L wrote: Medication: ibuprofen (ADVIL,MOTRIN) 600 MG tablet     would like the 800mg   Has the patient contacted their pharmacy? Yes.   (Agent: If no, request that the patient contact the pharmacy for the refill.) (Agent: If yes, when and what did the pharmacy advise?)they would send in a request today to change dosage   Preferred Pharmacy (with phone number or street name): [id number] - [place], [address] AT [address] [phone] (Phone) [phone] (Fax)    Agent: Please be advised that RX refills may take up to 3 business days. We ask that you follow-up with your pharmacy.

## 2018-02-23 NOTE — Telephone Encounter (Signed)
You had sent in for the 600mg .  She wants to know can she get the 800mg  dose.

## 2018-02-24 ENCOUNTER — Other Ambulatory Visit: Payer: Self-pay | Admitting: Family Medicine

## 2018-02-24 MED ORDER — IBUPROFEN 800 MG PO TABS: 800.0000 mg | ORAL_TABLET | Freq: Three times a day (TID) | ORAL | 0 refills | Status: DC | PRN

## 2018-02-24 NOTE — Telephone Encounter (Signed)
Sent in 800

## 2018-07-21 ENCOUNTER — Telehealth: Payer: Self-pay | Admitting: Family Medicine

## 2018-07-21 NOTE — Telephone Encounter (Signed)
Copied from [place] [id number]. Topic: Quick Communication - Rx Refill/Question >> Jul 21, 2018 10:20 AM Richardo Priest, NT wrote: Medication:  ibuprofen (ADVIL,MOTRIN) 800 MG tablet  Has the patient contacted their pharmacy? Yes, patient states they no longer have any.  Preferred Pharmacy (with phone number or street name):  [id number] - [place], [place] [phone] (Phone) [phone] (Fax)  Agent: Please be advised that RX refills may take up to 3 business days. We ask that you follow-up with your pharmacy.

## 2018-07-22 ENCOUNTER — Other Ambulatory Visit: Payer: Self-pay | Admitting: Family Medicine

## 2018-07-22 MED ORDER — IBUPROFEN 800 MG PO TABS: 800.0000 mg | ORAL_TABLET | Freq: Three times a day (TID) | ORAL | 0 refills | Status: DC | PRN

## 2018-07-22 NOTE — Telephone Encounter (Signed)
done

## 2018-07-25 NOTE — Telephone Encounter (Signed)
Patient notified rx has been sent in. 

## 2018-10-14 ENCOUNTER — Ambulatory Visit (INDEPENDENT_AMBULATORY_CARE_PROVIDER_SITE_OTHER): Payer: No Typology Code available for payment source | Admitting: Family

## 2018-10-14 ENCOUNTER — Other Ambulatory Visit: Payer: Self-pay

## 2018-10-14 DIAGNOSIS — H6690 Otitis media, unspecified, unspecified ear: Secondary | ICD-10-CM

## 2018-10-14 MED ORDER — AMOXICILLIN 500 MG PO CAPS: 500.0000 mg | ORAL_CAPSULE | Freq: Three times a day (TID) | ORAL | 0 refills | Status: DC

## 2018-10-14 NOTE — Progress Notes (Signed)
Virtual Visit via Video Note  I connected with Jo Compton on 10/14/18 at  2:20 PM EDT by a video enabled telemedicine application and verified that I am speaking with the correct person using two identifiers.  Location: Patient: home Provider: office   I discussed the limitations of evaluation and management by telemedicine and the availability of in person appointments. The patient expressed understanding and agreed to proceed.  History of Present Illness:  Patient is a 53 yr old female who presents today with chief complaint of otalgia of the left ear.  She reports that she has had pain off and on for a few days.   Reports some drainage. Denies fever.  She reports that she has some mild nasal drainage.  She reports that she has seasonal allergies and uses claritin.    Observations/Objective:   Gen: Awake, alert, no acute distress Resp: Breathing is even and non-labored Psych: calm/pleasant demeanor Neuro: Alert and Oriented x 3, + facial symmetry, speech is clear.    Assessment and Plan:  Otitis Media- Will rx with amoxicillin. Pt is advised to call if symptoms worsen or if symptoms fail to improve. Advised her that she would need a face to face visit at that time. She verbalizes understanding.    Follow Up Instructions:    I discussed the assessment and treatment plan with the patient. The patient was provided an opportunity to ask questions and all were answered. The patient agreed with the plan and demonstrated an understanding of the instructions.   The patient was advised to call back or seek an in-person evaluation if the symptoms worsen or if the condition fails to improve as anticipated.   Nance Pear, NP

## 2018-10-17 ENCOUNTER — Encounter: Payer: Self-pay | Admitting: Family

## 2018-11-08 ENCOUNTER — Telehealth: Payer: Self-pay | Admitting: Family Medicine

## 2018-11-08 NOTE — Telephone Encounter (Signed)
Needs virtual visit or visit to discuss.

## 2018-11-08 NOTE — Telephone Encounter (Signed)
Relation to pt: self  Call back number:  [phone]   Pharmacy: [id number] - [place], [place] [phone] (Phone) [phone] (Fax)     Reason for call:  Patient requesting hydrochlorothiazide 25 mg due to swollen feet, please advise

## 2018-11-08 NOTE — Telephone Encounter (Signed)
appt for 11/10/18

## 2018-11-10 ENCOUNTER — Encounter: Payer: Self-pay | Admitting: Family Medicine

## 2018-11-10 ENCOUNTER — Ambulatory Visit (INDEPENDENT_AMBULATORY_CARE_PROVIDER_SITE_OTHER): Payer: No Typology Code available for payment source | Admitting: Family Medicine

## 2018-11-10 ENCOUNTER — Other Ambulatory Visit: Payer: Self-pay

## 2018-11-10 DIAGNOSIS — I10 Essential (primary) hypertension: Secondary | ICD-10-CM

## 2018-11-10 DIAGNOSIS — I739 Peripheral vascular disease, unspecified: Secondary | ICD-10-CM | POA: Diagnosis not present

## 2018-11-10 DIAGNOSIS — R600 Localized edema: Secondary | ICD-10-CM | POA: Diagnosis not present

## 2018-11-10 MED ORDER — HYDROCHLOROTHIAZIDE 25 MG PO TABS: 25.0000 mg | ORAL_TABLET | Freq: Every day | ORAL | 3 refills | Status: DC

## 2018-11-10 NOTE — Progress Notes (Signed)
Virtual Visit via Video Note  I connected with Jo Compton on 11/10/18 at  4:00 PM EDT by a video enabled telemedicine application and verified that I am speaking with the correct person using two identifiers.  Location: Patient: home  Provider: office     I discussed the limitations of evaluation and management by telemedicine and the availability of in person appointments. The patient expressed understanding and agreed to proceed.  History of Present Illness: Pt is home c/o swelling in her feet and ankles .  She had old hctz rx and took them and they helped.  No other complaints. No sob, no cp, no palp.     Past Medical History:  Diagnosis Date  . Anemia   . Depression   . Fistula    rectal  . OAB (overactive bladder)   . Rectal bleeding    Current Outpatient Medications on File Prior to Visit  Medication Sig Dispense Refill  . amoxicillin (AMOXIL) 500 MG capsule Take 1 capsule (500 mg total) by mouth 3 (three) times daily. 30 capsule 0  . docusate sodium (COLACE) 100 MG capsule Take 1 capsule (100 mg total) by mouth 2 (two) times daily. 120 capsule 2  . ibuprofen (ADVIL,MOTRIN) 800 MG tablet Take 1 tablet (800 mg total) by mouth every 8 (eight) hours as needed. 30 tablet 0   No current facility-administered medications on file prior to visit.     Observations/Objective: 156/103   Afebrile   No other vitals obtained Pt is NAD   Assessment and Plan: 1. Lower extremity edema Elevate legs Drink more water  F/u 2 weeks  - hydrochlorothiazide (HYDRODIURIL) 25 MG tablet; Take 1 tablet (25 mg total) by mouth daily.  Dispense: 90 tablet; Refill: 3  2. Essential hypertension Start hctz  F/u 1-2 weeks  Poorly controlled will alter medications, encouraged DASH diet, minimize caffeine and obtain adequate sleep. Report concerning symptoms and follow up as directed and as needed - hydrochlorothiazide (HYDRODIURIL) 25 MG tablet; Take 1 tablet (25 mg total) by mouth daily.   Dispense: 90 tablet; Refill: 3  Follow Up Instructions:    I discussed the assessment and treatment plan with the patient. The patient was provided an opportunity to ask questions and all were answered. The patient agreed with the plan and demonstrated an understanding of the instructions.   The patient was advised to call back or seek an in-person evaluation if the symptoms worsen or if the condition fails to improve as anticipated.  I provided 25 minutes of non-face-to-face time during this encounter.   Jo Held, DO

## 2018-11-25 ENCOUNTER — Ambulatory Visit (INDEPENDENT_AMBULATORY_CARE_PROVIDER_SITE_OTHER): Payer: No Typology Code available for payment source | Admitting: Family Medicine

## 2018-11-25 ENCOUNTER — Other Ambulatory Visit: Payer: Self-pay

## 2018-11-25 ENCOUNTER — Encounter: Payer: Self-pay | Admitting: Family Medicine

## 2018-11-25 VITALS — BP 116/80 | HR 81 | Temp 98.2°F | Resp 18 | Ht 67.0 in | Wt 209.0 lb

## 2018-11-25 DIAGNOSIS — R252 Cramp and spasm: Secondary | ICD-10-CM

## 2018-11-25 DIAGNOSIS — H6502 Acute serous otitis media, left ear: Secondary | ICD-10-CM | POA: Diagnosis not present

## 2018-11-25 MED ORDER — OFLOXACIN 0.3 % OT SOLN: 10.0000 [drp] | Freq: Every day | OTIC | 0 refills | Status: DC

## 2018-11-25 MED ORDER — CEFDINIR 300 MG PO CAPS: 300.0000 mg | ORAL_CAPSULE | Freq: Two times a day (BID) | ORAL | 0 refills | Status: DC

## 2018-11-25 MED ORDER — IBUPROFEN 800 MG PO TABS: 800.0000 mg | ORAL_TABLET | Freq: Three times a day (TID) | ORAL | 0 refills | Status: AC | PRN

## 2018-11-25 NOTE — Assessment & Plan Note (Signed)
Eat k rich foods Check labs  If labs are normal Consider abi------ pain worsens with walking

## 2018-11-25 NOTE — Patient Instructions (Addendum)
Take calcium 1000 mg daily with vita D3 1000u      Leg Cramps Leg cramps occur when one or more muscles tighten and you have no control over this tightening (involuntary muscle contraction). Muscle cramps can develop in any muscle, but the most common place is in the calf muscles of the leg. Those cramps can occur during exercise or when you are at rest. Leg cramps are painful, and they may last for a few seconds to a few minutes. Cramps may return several times before they finally stop. Usually, leg cramps are not caused by a serious medical problem. In many cases, the cause is not known. Some common causes include:  Excessive physical effort (overexertion), such as during intense exercise.  Overuse from repetitive motions, or doing the same thing over and over.  Staying in a certain position for a long period of time.  Improper preparation, form, or technique while performing a sport or an activity.  Dehydration.  Injury.  Side effects of certain medicines.  Abnormally low levels of minerals in your blood (electrolytes), especially potassium and calcium. This could result from: ? Pregnancy. ? Taking diuretic medicines. Follow these instructions at home: Eating and drinking  Drink enough fluid to keep your urine pale yellow. Staying hydrated may help prevent cramps.  Eat a healthy diet that includes plenty of nutrients to help your muscles function. A healthy diet includes fruits and vegetables, lean protein, whole grains, and low-fat or nonfat dairy products. Managing pain, stiffness, and swelling      Try massaging, stretching, and relaxing the affected muscle. Do this for several minutes at a time.  If directed, put ice on areas that are sore or painful after a cramp: ? Put ice in a plastic bag. ? Place a towel between your skin and the bag. ? Leave the ice on for 20 minutes, 2-3 times a day.  If directed, apply heat to muscles that are tense or tight. Do this before  you exercise, or as often as told by your health care provider. Use the heat source that your health care provider recommends, such as a moist heat pack or a heating pad. ? Place a towel between your skin and the heat source. ? Leave the heat on for 20-30 minutes. ? Remove the heat if your skin turns bright red. This is especially important if you are unable to feel pain, heat, or cold. You may have a greater risk of getting burned.  Try taking hot showers or baths to help relax tight muscles. General instructions  If you are having frequent leg cramps, avoid intense exercise for several days.  Take over-the-counter and prescription medicines only as told by your health care provider.  Keep all follow-up visits as told by your health care provider. This is important. Contact a health care provider if:  Your leg cramps get more severe or more frequent, or they do not improve over time.  Your foot becomes cold, numb, or blue. Summary  Muscle cramps can develop in any muscle, but the most common place is in the calf muscles of the leg.  Leg cramps are painful, and they may last for a few seconds to a few minutes.  Usually, leg cramps are not caused by a serious medical problem. Often, the cause is not known.  Stay hydrated and take over-the-counter and prescription medicines only as told by your health care provider. This information is not intended to replace advice given to you by your health  care provider. Make sure you discuss any questions you have with your health care provider. Document Released: 05/21/2004 Document Revised: 03/26/2017 Document Reviewed: 01/21/2017 Elsevier Patient Education  2020 [employer].

## 2018-11-25 NOTE — Assessment & Plan Note (Signed)
abx per orders If no better consider ent referral

## 2018-11-25 NOTE — Progress Notes (Signed)
Patient ID: Jo Compton, female    DOB: 1965/09/18  Age: 53 y.o. MRN: [id number]    Subjective:  Subjective  HPI DANITRA PAYANO presents for L ear pain-- she saw Np and was given abx -----  It did improve but then the pain came right back.  She also c/o b/l leg pain esp with standing and walking.    Review of Systems  Constitutional: Negative for appetite change, diaphoresis, fatigue and unexpected weight change.  HENT: Positive for ear pain. Negative for congestion, ear discharge, facial swelling, hearing loss, mouth sores, nosebleeds, postnasal drip, rhinorrhea, sinus pressure and sinus pain.   Eyes: Negative for pain, redness and visual disturbance.  Respiratory: Negative for cough, chest tightness, shortness of breath and wheezing.   Cardiovascular: Negative for chest pain, palpitations and leg swelling.  Endocrine: Negative for cold intolerance, heat intolerance, polydipsia, polyphagia and polyuria.  Genitourinary: Negative for difficulty urinating, dysuria and frequency.  Musculoskeletal: Positive for myalgias.  Neurological: Negative for dizziness, light-headedness, numbness and headaches.    History Past Medical History:  Diagnosis Date  . Anemia   . Depression   . Fistula    rectal  . OAB (overactive bladder)   . Rectal bleeding     She has a past surgical history that includes Appendectomy; Colonoscopy; Abdominal hysterectomy (Bilateral, 03/20/2015); laparoscopy (N/A, 03/20/2015); Bilateral salpingectomy (Bilateral, 03/20/2015); and Cystoscopy (N/A, 03/20/2015).   Her family history includes COPD in her mother; Cancer in her maternal uncle and maternal uncle; Diabetes in her maternal aunt; Hypertension in her maternal aunt and mother.She reports that she has never smoked. She has never used smokeless tobacco. She reports current alcohol use of about 1.0 standard drinks of alcohol per week. She reports that she does not use drugs.  Current Outpatient Medications on File  Prior to Visit  Medication Sig Dispense Refill  . docusate sodium (COLACE) 100 MG capsule Take 1 capsule (100 mg total) by mouth 2 (two) times daily. 120 capsule 2  . hydrochlorothiazide (HYDRODIURIL) 25 MG tablet Take 1 tablet (25 mg total) by mouth daily. 90 tablet 3   No current facility-administered medications on file prior to visit.      Objective:  Objective  Physical Exam Vitals signs and nursing note reviewed.  Constitutional:      Appearance: She is well-developed.  HENT:     Head: Normocephalic and atraumatic.     Right Ear: Tympanic membrane, ear canal and external ear normal.     Left Ear: Tenderness present. A middle ear effusion is present. Tympanic membrane is injected.     Ears:     Comments: L canal errythematous  Eyes:     Conjunctiva/sclera: Conjunctivae normal.  Neck:     Musculoskeletal: Normal range of motion and neck supple.     Thyroid: No thyromegaly.     Vascular: No carotid bruit or JVD.  Cardiovascular:     Rate and Rhythm: Normal rate and regular rhythm.     Heart sounds: Normal heart sounds. No murmur.  Pulmonary:     Effort: Pulmonary effort is normal. No respiratory distress.     Breath sounds: Normal breath sounds. No wheezing or rales.  Chest:     Chest wall: No tenderness.  Neurological:     Mental Status: She is alert and oriented to person, place, and time.    BP 116/80 (BP Location: Left Arm, Patient Position: Sitting, Cuff Size: Large)   Pulse 81   Temp 98.2 F (36.8  C) (Oral)   Resp 18   Ht 5\' 7"  (1.702 m)   Wt 209 lb (94.8 kg)   LMP 02/14/2015 (Approximate)   SpO2 99%   BMI 32.73 kg/m  Wt Readings from Last 3 Encounters:  11/25/18 209 lb (94.8 kg)  01/07/18 208 lb 12.8 oz (94.7 kg)  12/31/17 205 lb (93 kg)     Lab Results  Component Value Date   WBC 5.8 06/16/2016   HGB 13.4 06/16/2016   HCT 40.6 06/16/2016   PLT 247.0 06/16/2016   GLUCOSE 84 06/16/2016   CHOL 160 06/07/2012   TRIG 101.0 06/07/2012   HDL 49.10  06/07/2012   LDLCALC 91 06/07/2012   ALT 17 06/16/2016   AST 17 06/16/2016   NA 138 06/16/2016   K 3.9 06/16/2016   CL 105 06/16/2016   CREATININE 0.90 06/16/2016   BUN 13 06/16/2016   CO2 27 06/16/2016   TSH 0.44 06/16/2016   HGBA1C 5.5 01/09/2013   MICROALBUR 0.1 06/07/2012    Dg Chest 2 View  Result Date: 06/15/2017 CLINICAL DATA:  Cough. EXAM: CHEST  2 VIEW COMPARISON:  Radiographs of Sep 19, 2011. FINDINGS: The heart size and mediastinal contours are within normal limits. Both lungs are clear. No pneumothorax or pleural effusion is noted. The visualized skeletal structures are unremarkable. IMPRESSION: No active cardiopulmonary disease. Electronically Signed   By: Marijo Conception, M.D.   On: 06/15/2017 15:33     Assessment & Plan:  Plan  I have discontinued Amado Nash. Powell's amoxicillin. I have also changed her ibuprofen. Additionally, I am having her start on cefdinir and ofloxacin. Lastly, I am having her maintain her docusate sodium and hydrochlorothiazide.  Meds ordered this encounter  Medications  . cefdinir (OMNICEF) 300 MG capsule    Sig: Take 1 capsule (300 mg total) by mouth 2 (two) times daily.    Dispense:  14 capsule    Refill:  0  . ofloxacin (FLOXIN OTIC) 0.3 % OTIC solution    Sig: Place 10 drops into the left ear daily.    Dispense:  5 mL    Refill:  0  . ibuprofen (ADVIL) 800 MG tablet    Sig: Take 1 tablet (800 mg total) by mouth every 8 (eight) hours as needed.    Dispense:  30 tablet    Refill:  0    Problem List Items Addressed This Visit      Unprioritized   Leg cramps - Primary    Eat k rich foods Check labs  If labs are normal Consider abi------ pain worsens with walking        Relevant Medications   ibuprofen (ADVIL) 800 MG tablet   Other Relevant Orders   Comprehensive metabolic panel   Magnesium   Phosphorus   Non-recurrent acute serous otitis media of left ear    abx per orders If no better consider ent referral       Relevant Medications   cefdinir (OMNICEF) 300 MG capsule   ofloxacin (FLOXIN OTIC) 0.3 % OTIC solution      Follow-up: Return if symptoms worsen or fail to improve.  [name], DO

## 2018-11-26 LAB — MAGNESIUM: Magnesium: 2.1 mg/dL (ref 1.5–2.5)

## 2018-11-26 LAB — COMPREHENSIVE METABOLIC PANEL
AG Ratio: 1.6 (calc) (ref 1.0–2.5)
ALT: 23 U/L (ref 6–29)
AST: 21 U/L (ref 10–35)
Albumin: 4.4 g/dL (ref 3.6–5.1)
Alkaline phosphatase (APISO): 75 U/L (ref 37–153)
BUN: 12 mg/dL (ref 7–25)
CO2: 28 mmol/L (ref 20–32)
Calcium: 10.3 mg/dL (ref 8.6–10.4)
Chloride: 103 mmol/L (ref 98–110)
Creat: 0.88 mg/dL (ref 0.50–1.05)
Globulin: 2.7 g/dL (calc) (ref 1.9–3.7)
Glucose, Bld: 114 mg/dL — ABNORMAL HIGH (ref 65–99)
Potassium: 3.9 mmol/L (ref 3.5–5.3)
Sodium: 140 mmol/L (ref 135–146)
Total Bilirubin: 0.3 mg/dL (ref 0.2–1.2)
Total Protein: 7.1 g/dL (ref 6.1–8.1)

## 2018-11-26 LAB — PHOSPHORUS: Phosphorus: 3.3 mg/dL (ref 2.5–4.5)

## 2018-12-06 ENCOUNTER — Ambulatory Visit: Payer: No Typology Code available for payment source | Admitting: Family Medicine

## 2018-12-06 ENCOUNTER — Other Ambulatory Visit: Payer: Self-pay

## 2018-12-06 ENCOUNTER — Encounter: Payer: Self-pay | Admitting: Family Medicine

## 2018-12-06 ENCOUNTER — Ambulatory Visit: Payer: Self-pay | Admitting: Family Medicine

## 2018-12-06 ENCOUNTER — Ambulatory Visit (HOSPITAL_BASED_OUTPATIENT_CLINIC_OR_DEPARTMENT_OTHER)
Admission: RE | Admit: 2018-12-06 | Discharge: 2018-12-06 | Disposition: A | Discharge status: Home / Self Care | Payer: No Typology Code available for payment source | Source: Ambulatory Visit | Attending: Family Medicine | Admitting: Family Medicine

## 2018-12-06 VITALS — BP 117/79 | HR 65 | Temp 97.1°F | Resp 18 | Ht 67.0 in | Wt 210.8 lb

## 2018-12-06 DIAGNOSIS — R002 Palpitations: Secondary | ICD-10-CM

## 2018-12-06 DIAGNOSIS — R5383 Other fatigue: Secondary | ICD-10-CM

## 2018-12-06 DIAGNOSIS — R29898 Other symptoms and signs involving the musculoskeletal system: Secondary | ICD-10-CM

## 2018-12-06 DIAGNOSIS — R739 Hyperglycemia, unspecified: Secondary | ICD-10-CM | POA: Diagnosis not present

## 2018-12-06 NOTE — Telephone Encounter (Signed)
Pt reports "Felt like my heart was racing last envening and my right arm felt weak."  States checked BP 112/80 HR 80.  States duration 15-30 minutes. "Right arm was either weak or numb, not sure, felt strange."  States this AM right arm still "A little weak." Reports ROM WNL. Denies any other symptoms: no facial droop, speech clear. VS this AM 142/94 HR 57, O2 sat 97%. Pt reports she has had pain and some weakness of her right leg "Fors years."  Call transferred to practice for consideration of appt today. Pt instructed to go directly to ED if symptoms reoccur. Pt verbalizes understanding.  Reason for Disposition . [1] Weakness of the face, arm / hand, or leg / foot on one side of the body AND [2] gradual onset (e.g., days to weeks) AND [3] present now  Answer Assessment - Initial Assessment Questions 1. SYMPTOM: "What is the main symptom you are concerned about?" (e.g., weakness, numbness)     Weakness of right arm 2. ONSET: "When did this start?" (minutes, hours, days; while sleeping)    Yesterday evening 3. LAST NORMAL: "When was the last time you were normal (no symptoms)?"    Last evening 4. PATTERN "Does this come and go, or has it been constant since it started?"  "Is it present now?"     Not present now, duration 15-30 minutes 5. CARDIAC SYMPTOMS: "Have you had any of the following symptoms: chest pain, difficulty breathing, palpitations?"     "Felt like my heart was racing" but it was only 69" 6. NEUROLOGIC SYMPTOMS: "Have you had any of the following symptoms: headache, dizziness, vision loss, double vision, changes in speech, unsteady on your feet?"     no 7. OTHER SYMPTOMS: "Do you have any other symptoms?"     no  Protocols used: NEUROLOGIC DEFICIT-A-AH

## 2018-12-06 NOTE — Telephone Encounter (Signed)
Appt scheduled

## 2018-12-06 NOTE — Progress Notes (Signed)
Patient ID: Jo Compton, female    DOB: [birth year]  Age: 53 y.o. MRN: [id number]    Subjective:  Subjective  HPI Jo Compton presents with c/o weakness in R arm last night , palpitations, sob and a jittery feeling .  No chest pain   No fevers , n/v /d .    Symptoms have almost completely resolved today but she c/o a feeling of weakness in r arm still    Review of Systems  Constitutional: Negative for appetite change, diaphoresis, fatigue and unexpected weight change.  Eyes: Negative for pain, redness and visual disturbance.  Respiratory: Negative for cough, chest tightness, shortness of breath and wheezing.   Cardiovascular: Positive for palpitations. Negative for chest pain and leg swelling.  Endocrine: Negative for cold intolerance, heat intolerance, polydipsia, polyphagia and polyuria.  Genitourinary: Negative for difficulty urinating, dysuria and frequency.  Musculoskeletal: Negative for arthralgias, back pain, gait problem, joint swelling, myalgias, neck pain and neck stiffness.  Neurological: Positive for weakness. Negative for dizziness, light-headedness, numbness and headaches.    History Past Medical History:  Diagnosis Date  . Anemia   . Depression   . Fistula    rectal  . OAB (overactive bladder)   . Rectal bleeding     She has a past surgical history that includes Appendectomy; Colonoscopy; Abdominal hysterectomy (Bilateral, 03/20/2015); laparoscopy (N/A, 03/20/2015); Bilateral salpingectomy (Bilateral, 03/20/2015); and Cystoscopy (N/A, 03/20/2015).   Her family history includes COPD in her mother; Cancer in her maternal uncle and maternal uncle; Diabetes in her maternal aunt; Hypertension in her maternal aunt and mother.She reports that she has never smoked. She has never used smokeless tobacco. She reports current alcohol use of about 1.0 standard drinks of alcohol per week. She reports that she does not use drugs.  Current Outpatient Medications on File Prior to  Visit  Medication Sig Dispense Refill  . docusate sodium (COLACE) 100 MG capsule Take 1 capsule (100 mg total) by mouth 2 (two) times daily. 120 capsule 2  . hydrochlorothiazide (HYDRODIURIL) 25 MG tablet Take 1 tablet (25 mg total) by mouth daily. 90 tablet 3  . ibuprofen (ADVIL) 800 MG tablet Take 1 tablet (800 mg total) by mouth every 8 (eight) hours as needed. 30 tablet 0  . ofloxacin (FLOXIN OTIC) 0.3 % OTIC solution Place 10 drops into the left ear daily. 5 mL 0  . cefdinir (OMNICEF) 300 MG capsule Take 1 capsule (300 mg total) by mouth 2 (two) times daily. (Patient not taking: Reported on 12/06/2018) 14 capsule 0   No current facility-administered medications on file prior to visit.      Objective:  Objective  Physical Exam Vitals signs and nursing note reviewed.  Constitutional:      Appearance: She is well-developed.  HENT:     Head: Normocephalic and atraumatic.  Eyes:     Conjunctiva/sclera: Conjunctivae normal.  Neck:     Musculoskeletal: Normal range of motion and neck supple.     Thyroid: No thyromegaly.     Vascular: No carotid bruit or JVD.  Cardiovascular:     Rate and Rhythm: Normal rate and regular rhythm.     Heart sounds: Normal heart sounds. No murmur.  Pulmonary:     Effort: Pulmonary effort is normal. No respiratory distress.     Breath sounds: Normal breath sounds. No wheezing or rales.  Chest:     Chest wall: No tenderness.  Musculoskeletal:        General: No swelling, tenderness,  deformity or signs of injury.     Right lower leg: No edema.     Left lower leg: No edema.  Neurological:     General: No focal deficit present.     Mental Status: She is alert and oriented to person, place, and time.     Cranial Nerves: No cranial nerve deficit.     Sensory: No sensory deficit.     Motor: No weakness.     Coordination: Coordination normal.     Gait: Gait normal.    BP 117/79 (BP Location: Left Arm, Patient Position: Sitting, Cuff Size: Normal)    Pulse 65   Temp (!) 97.1 F (36.2 C) (Temporal)   Resp 18   Ht 5\' 7"  (1.702 m)   Wt 210 lb 12.8 oz (95.6 kg)   LMP 02/14/2015 (Approximate)   SpO2 100%   BMI 33.02 kg/m  Wt Readings from Last 3 Encounters:  12/06/18 210 lb 12.8 oz (95.6 kg)  11/25/18 209 lb (94.8 kg)  01/07/18 208 lb 12.8 oz (94.7 kg)   ekg-- no change from previous  Lab Results  Component Value Date   WBC 5.8 06/16/2016   HGB 13.4 06/16/2016   HCT 40.6 06/16/2016   PLT 247.0 06/16/2016   GLUCOSE 114 (H) 11/25/2018   CHOL 160 06/07/2012   TRIG 101.0 06/07/2012   HDL 49.10 06/07/2012   LDLCALC 91 06/07/2012   ALT 23 11/25/2018   AST 21 11/25/2018   NA 140 11/25/2018   K 3.9 11/25/2018   CL 103 11/25/2018   CREATININE 0.88 11/25/2018   BUN 12 11/25/2018   CO2 28 11/25/2018   TSH 0.44 06/16/2016   HGBA1C 5.5 01/09/2013   MICROALBUR 0.1 06/07/2012    Dg Chest 2 View  Result Date: 06/15/2017 CLINICAL DATA:  Cough. EXAM: CHEST  2 VIEW COMPARISON:  Radiographs of Sep 19, 2011. FINDINGS: The heart size and mediastinal contours are within normal limits. Both lungs are clear. No pneumothorax or pleural effusion is noted. The visualized skeletal structures are unremarkable. IMPRESSION: No active cardiopulmonary disease. Electronically Signed   By: Marijo Conception, M.D.   On: 06/15/2017 15:33     Assessment & Plan:  Plan  I am having Jo Compton maintain her docusate sodium, hydrochlorothiazide, cefdinir, ofloxacin, and ibuprofen.  No orders of the defined types were placed in this encounter.   Problem List Items Addressed This Visit    None    Visit Diagnoses    Palpitations    -  Primary   Relevant Orders   EKG 12-Lead (Completed)   CBC with Differential/Platelet   TSH   Vitamin B12   Vitamin D 1,25 dihydroxy   Comprehensive metabolic panel   POCT Urinalysis Dipstick (Automated)   Fatigue, unspecified type       Relevant Orders   CBC with Differential/Platelet   TSH   Vitamin B12    Vitamin D 1,25 dihydroxy   Comprehensive metabolic panel   POCT Urinalysis Dipstick (Automated)   RUE weakness       Relevant Orders   CT HEAD WO CONTRAST (Completed)   Hyperglycemia       Relevant Orders   Hemoglobin A1c    check labs  Ct head  R/o tia/ cva Pt instructed to go to er if symptoms return Consider event montior Asa 81 mg daily  Follow-up: Return if symptoms worsen or fail to improve.  [name], DO

## 2018-12-06 NOTE — Patient Instructions (Signed)
Weakness °Weakness is a lack of strength. You may feel weak all over your body (generalized), or you may feel weak in one specific part of your body (focal). Common causes of weakness include: °· Infection and immune system disorders. °· Physical exhaustion. °· Internal bleeding or other blood loss that results in a lack of red blood cells (anemia). °· Dehydration. °· An imbalance in mineral (electrolyte) levels, such as potassium. °· Heart disease, circulation problems, or stroke. °Other causes include: °· Some medicines or cancer treatment. °· Stress, anxiety, or depression. °· Nervous system disorders. °· Thyroid disorders. °· Loss of muscle strength because of age or inactivity. °· Poor sleep quality or sleep disorders. °The cause of your weakness may not be known. Some causes of weakness can be serious, so it is important to see your health care provider. °Follow these instructions at home: °Activity °· Rest as needed. °· Try to get enough sleep. Most adults need 7-8 hours of quality sleep each night. Talk to your health care provider about how much sleep you need each night. °· Do exercises, such as arm curls and leg raises, for 30 minutes at least 2 days a week or as told by your health care provider. This helps build muscle strength. °· Consider working with a physical therapist or trainer who can develop an exercise plan to help you gain muscle strength. °General instructions ° °· Take over-the-counter and prescription medicines only as told by your health care provider. °· Eat a healthy, well-balanced diet. This includes: °? Proteins to build muscles, such as lean meats and fish. °? Fresh fruits and vegetables. °? Carbohydrates to boost energy, such as whole grains. °· Drink enough fluid to keep your urine pale yellow. °· Keep all follow-up visits as told by your health care provider. This is important. °Contact a health care provider if your weakness: °· Does not improve or gets worse. °· Affects your  ability to think clearly. °· Affects your ability to do your normal daily activities. °Get help right away if you: °· Develop sudden weakness, especially on one side of your face or body. °· Have chest pain. °· Have trouble breathing or shortness of breath. °· Have problems with your vision. °· Have trouble talking or swallowing. °· Have trouble standing or walking. °· Are light-headed or lose consciousness. °Summary °· Weakness is a lack of strength. You may feel weak all over your body or just in one specific part of your body. °· Weakness can be caused by a variety of things. In some cases, the cause may be unknown. °· Rest as needed, and try to get enough sleep. Most adults need 7-8 hours of quality sleep each night. °· Eat a healthy, well-balanced diet. °This information is not intended to replace advice given to you by your health care provider. Make sure you discuss any questions you have with your health care provider. °Document Released: 04/13/2005 Document Revised: 11/17/2017 Document Reviewed: 11/17/2017 °Elsevier Patient Education © 2020 [employer] ° °

## 2018-12-07 ENCOUNTER — Telehealth: Payer: Self-pay | Admitting: *Deleted

## 2018-12-07 LAB — CBC WITH DIFFERENTIAL/PLATELET
Basophils Absolute: 0 10*3/uL (ref 0.0–0.1)
Basophils Relative: 0.5 % (ref 0.0–3.0)
Eosinophils Absolute: 0.1 10*3/uL (ref 0.0–0.7)
Eosinophils Relative: 1.7 % (ref 0.0–5.0)
HCT: 43.9 % (ref 36.0–46.0)
Hemoglobin: 14.3 g/dL (ref 12.0–15.0)
Lymphocytes Relative: 43.7 % (ref 12.0–46.0)
Lymphs Abs: 2.1 10*3/uL (ref 0.7–4.0)
MCHC: 32.5 g/dL (ref 30.0–36.0)
MCV: 78.1 fl (ref 78.0–100.0)
Monocytes Absolute: 0.4 10*3/uL (ref 0.1–1.0)
Monocytes Relative: 9 % (ref 3.0–12.0)
Neutro Abs: 2.2 10*3/uL (ref 1.4–7.7)
Neutrophils Relative %: 45.1 % (ref 43.0–77.0)
Platelets: 251 10*3/uL (ref 150.0–400.0)
RBC: 5.63 Mil/uL — ABNORMAL HIGH (ref 3.87–5.11)
RDW: 14.3 % (ref 11.5–15.5)
WBC: 4.9 10*3/uL (ref 4.0–10.5)

## 2018-12-07 LAB — COMPREHENSIVE METABOLIC PANEL
ALT: 25 U/L (ref 0–35)
AST: 22 U/L (ref 0–37)
Albumin: 4.3 g/dL (ref 3.5–5.2)
Alkaline Phosphatase: 70 U/L (ref 39–117)
BUN: 11 mg/dL (ref 6–23)
CO2: 29 mEq/L (ref 19–32)
Calcium: 10.6 mg/dL — ABNORMAL HIGH (ref 8.4–10.5)
Chloride: 100 mEq/L (ref 96–112)
Creatinine, Ser: 0.79 mg/dL (ref 0.40–1.20)
GFR: 92.8 mL/min (ref >60.00)
Glucose, Bld: 84 mg/dL (ref 70–99)
Potassium: 4.1 mEq/L (ref 3.5–5.1)
Sodium: 138 mEq/L (ref 135–145)
Total Bilirubin: 0.5 mg/dL (ref 0.2–1.2)
Total Protein: 6.9 g/dL (ref 6.0–8.3)

## 2018-12-07 LAB — VITAMIN B12: Vitamin B-12: 306 pg/mL (ref 211–911)

## 2018-12-07 LAB — TSH: TSH: 0.54 u[IU]/mL (ref 0.35–4.50)

## 2018-12-07 LAB — HEMOGLOBIN A1C: Hgb A1c MFr Bld: 6.2 % (ref 4.6–6.5)

## 2018-12-07 NOTE — Telephone Encounter (Signed)
Patient notified and she was asking about lab work.  I advised they have not been reviewed.

## 2018-12-07 NOTE — Telephone Encounter (Signed)
No answer no vm

## 2018-12-07 NOTE — Telephone Encounter (Signed)
-----   Message from [name], [place] sent at 12/06/2018  7:27 PM EDT ----- I forgot to tell the pt to take a baby aspirin daily 81 mg ---- would one of you call her and leg her know ?  Thanks

## 2018-12-08 ENCOUNTER — Other Ambulatory Visit: Payer: Self-pay | Admitting: Family Medicine

## 2018-12-08 DIAGNOSIS — R7303 Prediabetes: Secondary | ICD-10-CM

## 2018-12-08 NOTE — Telephone Encounter (Signed)
Labs reviewed.

## 2018-12-09 LAB — VITAMIN D 1,25 DIHYDROXY
Vitamin D 1, 25 (OH)2 Total: 34 pg/mL (ref 18–72)
Vitamin D2 1, 25 (OH)2: <8 pg/mL
Vitamin D3 1, 25 (OH)2: 34 pg/mL

## 2018-12-09 NOTE — Telephone Encounter (Signed)
Reviewed labs with patient. Pt states she was seen at the dentist and he reported she had a knot on her throat. Pt states will go back to schedule an appointment with [facility] sometime next week.

## 2018-12-20 ENCOUNTER — Encounter: Payer: Self-pay | Admitting: Family Medicine

## 2018-12-20 ENCOUNTER — Other Ambulatory Visit: Payer: Self-pay

## 2018-12-20 ENCOUNTER — Telehealth: Payer: Self-pay | Admitting: Family Medicine

## 2018-12-20 ENCOUNTER — Ambulatory Visit (INDEPENDENT_AMBULATORY_CARE_PROVIDER_SITE_OTHER): Payer: No Typology Code available for payment source | Admitting: Family Medicine

## 2018-12-20 DIAGNOSIS — H6502 Acute serous otitis media, left ear: Secondary | ICD-10-CM | POA: Diagnosis not present

## 2018-12-20 DIAGNOSIS — R42 Dizziness and giddiness: Secondary | ICD-10-CM | POA: Diagnosis not present

## 2018-12-20 MED ORDER — MECLIZINE HCL 25 MG PO TABS: 25.0000 mg | ORAL_TABLET | Freq: Three times a day (TID) | ORAL | 0 refills | Status: DC | PRN

## 2018-12-20 MED ORDER — CEFDINIR 300 MG PO CAPS
300.0000 mg | ORAL_CAPSULE | Freq: Two times a day (BID) | ORAL | 0 refills | Status: DC
Start: 2018-12-20 — End: 2019-01-12

## 2018-12-20 NOTE — Telephone Encounter (Signed)
Unable to leave voicemail to schedule "Needs in person ov this week to re check ear" for DOS 12/20/18

## 2018-12-20 NOTE — Progress Notes (Signed)
Virtual Visit via Video Note  I connected with Jo Compton on 12/20/18 at  3:20 PM EDT by a video enabled telemedicine application and verified that I am speaking with the correct person using two identifiers.  Location: Patient: work Provider: office   I discussed the limitations of evaluation and management by telemedicine and the availability of in person appointments. The patient expressed understanding and agreed to proceed.  History of Present Illness: Pt is at work c/o con't L ear pain and dizziness.  She never took the abx from last ov because first few cause nausea but because it started to really bother her again she started it and is doing ok with it.  She is feeling better but only has 2 pills left   She also took a friends meclizine with relief.   No fever no other complaints No chest pain , no sob   Observations/Objective: No vitals obtained Pt in NAD  Assessment and Plan: 1. Non-recurrent acute serous otitis media of left ear 7 days abx Return to office this week so we can recheck the ear  - cefdinir (OMNICEF) 300 MG capsule; Take 1 capsule (300 mg total) by mouth 2 (two) times daily.  Dispense: 14 capsule; Refill: 0  2. Vertigo Try meclizine since it did help F/u this week in the office  Go to ER if symptoms worsen - meclizine (ANTIVERT) 25 MG tablet; Take 1 tablet (25 mg total) by mouth 3 (three) times daily as needed for dizziness.  Dispense: 30 tablet; Refill: 0   Follow Up Instructions:    I discussed the assessment and treatment plan with the patient. The patient was provided an opportunity to ask questions and all were answered. The patient agreed with the plan and demonstrated an understanding of the instructions.   The patient was advised to call back or seek an in-person evaluation if the symptoms worsen or if the condition fails to improve as anticipated.  I provided 15 minutes of non-face-to-face time during this encounter.   [name], DO

## 2018-12-26 ENCOUNTER — Encounter: Payer: Self-pay | Admitting: Family Medicine

## 2018-12-26 ENCOUNTER — Other Ambulatory Visit: Payer: Self-pay

## 2018-12-26 ENCOUNTER — Ambulatory Visit (HOSPITAL_BASED_OUTPATIENT_CLINIC_OR_DEPARTMENT_OTHER)
Admission: RE | Admit: 2018-12-26 | Discharge: 2018-12-26 | Disposition: A | Discharge status: Home / Self Care | Payer: No Typology Code available for payment source | Source: Ambulatory Visit | Attending: Family Medicine | Admitting: Family Medicine

## 2018-12-26 ENCOUNTER — Ambulatory Visit: Payer: No Typology Code available for payment source | Admitting: Family Medicine

## 2018-12-26 VITALS — BP 115/72 | HR 66 | Temp 96.7°F | Resp 12 | Ht 67.0 in | Wt 211.6 lb

## 2018-12-26 DIAGNOSIS — H6502 Acute serous otitis media, left ear: Secondary | ICD-10-CM | POA: Diagnosis not present

## 2018-12-26 DIAGNOSIS — E049 Nontoxic goiter, unspecified: Secondary | ICD-10-CM

## 2018-12-26 NOTE — Patient Instructions (Signed)
Goiter  A goiter is an enlarged thyroid gland. The thyroid is located in the lower front of the neck. It makes hormones that affect many body parts and systems, including the system that affects how quickly the body burns fuel for energy (metabolism). Most goiters are painless and are not a cause for concern. Some goiters can affect the way your thyroid makes thyroid hormones. Goiters and conditions that cause goiters can be treated, if necessary. What are the causes? Common causes of this condition include:  Lack (deficiency) of a mineral called iodine. The thyroid gland uses iodine to make thyroid hormones.  Diseases that attack healthy cells in the body (autoimmune diseases) and affect thyroid function, such as Graves' disease or Hashimoto's disease. These diseases may cause the body to produce too much thyroid hormone (hyperthyroidism) or too little of the hormone (hypothyroidism).  Conditions that cause inflammation of the thyroid (thyroiditis).  One or more small growths on the thyroid (nodular goiter). Other causes include:  Medical problems caused by abnormal genes that are passed from parent to child (genetic defects).  Thyroid injury or infection.  Tumors that may or may not be cancerous.  Pregnancy.  Certain medicines.  Exposure to radiation. In some cases, the cause may not be known. What increases the risk? This condition is more likely to develop in:  People who do not get enough iodine in their diet.  People who have a family history of goiter.  Women.  People who are older than age 40.  People who smoke tobacco.  People who have had exposure to radiation. What are the signs or symptoms? The main symptom of this condition is swelling in the lower, front part of the neck. This swelling can range from a very small bump to a large lump. Other symptoms may include:  A tight feeling in the throat.  A hoarse voice.  Coughing.  Wheezing.  Difficulty  swallowing or breathing.  Bulging veins in the neck.  Dizziness. When a goiter is the result of an overactive thyroid (hyperthyroidism), symptoms may also include:  Nervousness or restlessness.  Inability to tolerate heat.  Unexplained weight loss.  Diarrhea.  Change in the texture of hair or skin.  Changes in heartbeat, such as skipped beats, extra beats, or a rapid heart rate.  Loss of menstruation.  Shaky hands.  Increased appetite.  Sleep problems. When a goiter is the result of an underactive thyroid (hypothyroidism), symptoms may also include:  Feeling like you have no energy (lethargy).  Inability to tolerate cold.  Weight gain that is not explained by a change in diet or exercise habits.  Dry skin.  Coarse hair.  Irregular menstrual periods.  Constipation.  Sadness or depression.  Fatigue. In some cases, there may not be any symptoms and the thyroid hormone levels may be normal. How is this diagnosed? This condition may be diagnosed based on your symptoms, your medical history, and a physical exam. You may have tests, such as:  Blood tests to check thyroid function.  Imaging tests, such as: ? Ultrasound. ? CT scan. ? MRI. ? Thyroid scan.  Removal of a tissue sample (biopsy) of the goiter or any nodules. The sample will be tested to check for cancer. How is this treated? Treatment for this condition depends on the cause and your symptoms. Treatment may include:  Medicines to regulate thyroid hormone levels.  Anti-inflammatory medicines or steroid medicines, if the goiter is caused by inflammation.  Iodine supplements or changes to your   diet, if the goiter is caused by iodine deficiency.  Radioactive iodine treatment.  Surgery to remove your thyroid. In some cases, you may only need regular check-ups with your health care provider to monitor your condition, and you may not need treatment. Follow these instructions at home:  Follow  instructions from your health care provider about any changes to your diet.  Take over-the-counter and prescription medicines only as told by your health care provider. These include supplements.  Do not use any products that contain nicotine or tobacco, such as cigarettes and e-cigarettes. If you need help quitting, ask your health care provider.  Keep all follow-up visits as told by your health care provider. This is important. Contact a health care provider if:  Your symptoms do not get better with treatment.  You have nausea, vomiting, or diarrhea. Get help right away if:  You have sudden, unexplained confusion or other mental changes.  You have a fever.  You have chest pain.  You have trouble breathing or swallowing.  You suddenly become very weak.  You experience extreme restlessness.  You feel your heart racing. Summary  A goiter is an enlarged thyroid gland.  The thyroid gland is located in the lower front of the neck. It makes hormones that affect many body parts and systems, including the system that affects how quickly the body burns fuel for energy (metabolism).  The main symptom of this condition is swelling in the lower, front part of the neck. This swelling can range from a very small bump to a large lump.  Treatment for this condition depends on the cause and your symptoms. You may need medicines, supplements, or regular monitoring of your condition. This information is not intended to replace advice given to you by your health care provider. Make sure you discuss any questions you have with your health care provider. Document Released: 10/01/2009 Document Revised: 03/26/2017 Document Reviewed: 01/07/2017 Elsevier Patient Education  2020 Elsevier Inc.  

## 2018-12-26 NOTE — Progress Notes (Signed)
Patient ID: Jo Compton, female    DOB: Sep 22, 1965  Age: 53 y.o. MRN: [id number]    Subjective:  Subjective  HPI Jo Compton presents for f/u ears.  They feel much better after the abx She also would like her neck checked  Her dentist told her her thyroid felt big.  No other complaints.    Review of Systems  Constitutional: Negative for appetite change, diaphoresis, fatigue and unexpected weight change.  HENT: Negative for congestion, ear discharge, ear pain, hearing loss, postnasal drip and sore throat.   Eyes: Negative for pain, redness and visual disturbance.  Respiratory: Negative for cough, chest tightness, shortness of breath and wheezing.   Cardiovascular: Negative for chest pain, palpitations and leg swelling.  Endocrine: Negative for cold intolerance, heat intolerance, polydipsia, polyphagia and polyuria.  Genitourinary: Negative for difficulty urinating, dysuria and frequency.  Neurological: Negative for dizziness, light-headedness, numbness and headaches.    History Past Medical History:  Diagnosis Date  . Anemia   . Depression   . Fistula    rectal  . OAB (overactive bladder)   . Rectal bleeding     She has a past surgical history that includes Appendectomy; Colonoscopy; Abdominal hysterectomy (Bilateral, 03/20/2015); laparoscopy (N/A, 03/20/2015); Bilateral salpingectomy (Bilateral, 03/20/2015); and Cystoscopy (N/A, 03/20/2015).   Her family history includes COPD in her mother; Cancer in her maternal uncle and maternal uncle; Diabetes in her maternal aunt; Hypertension in her maternal aunt and mother.She reports that she has never smoked. She has never used smokeless tobacco. She reports current alcohol use of about 1.0 standard drinks of alcohol per week. She reports that she does not use drugs.  Current Outpatient Medications on File Prior to Visit  Medication Sig Dispense Refill  . cefdinir (OMNICEF) 300 MG capsule Take 1 capsule (300 mg total) by mouth 2  (two) times daily. 14 capsule 0  . docusate sodium (COLACE) 100 MG capsule Take 1 capsule (100 mg total) by mouth 2 (two) times daily. 120 capsule 2  . hydrochlorothiazide (HYDRODIURIL) 25 MG tablet Take 1 tablet (25 mg total) by mouth daily. 90 tablet 3  . ibuprofen (ADVIL) 800 MG tablet Take 1 tablet (800 mg total) by mouth every 8 (eight) hours as needed. 30 tablet 0  . meclizine (ANTIVERT) 25 MG tablet Take 1 tablet (25 mg total) by mouth 3 (three) times daily as needed for dizziness. 30 tablet 0  . ofloxacin (FLOXIN OTIC) 0.3 % OTIC solution Place 10 drops into the left ear daily. 5 mL 0   No current facility-administered medications on file prior to visit.      Objective:  Objective  Physical Exam Vitals signs and nursing note reviewed.  Constitutional:      Appearance: She is well-developed.  HENT:     Head: Normocephalic and atraumatic.  Eyes:     Conjunctiva/sclera: Conjunctivae normal.  Neck:     Musculoskeletal: Normal range of motion and neck supple.     Thyroid: Thyromegaly present. No thyroid mass or thyroid tenderness.     Vascular: No carotid bruit or JVD.  Cardiovascular:     Rate and Rhythm: Normal rate and regular rhythm.     Heart sounds: Normal heart sounds. No murmur.  Pulmonary:     Effort: Pulmonary effort is normal. No respiratory distress.     Breath sounds: Normal breath sounds. No wheezing or rales.  Chest:     Chest wall: No tenderness.  Neurological:     Mental Status: She  is alert and oriented to person, place, and time.    BP 115/72 (BP Location: Right Arm, Cuff Size: Large)   Pulse 66   Temp (!) 96.7 F (35.9 C) (Temporal)   Resp 12   Ht 5\' 7"  (1.702 m)   Wt 211 lb 9.6 oz (96 kg)   LMP 02/14/2015 (Approximate)   SpO2 100%   BMI 33.14 kg/m  Wt Readings from Last 3 Encounters:  12/26/18 211 lb 9.6 oz (96 kg)  12/06/18 210 lb 12.8 oz (95.6 kg)  11/25/18 209 lb (94.8 kg)     Lab Results  Component Value Date   WBC 4.9 12/06/2018    HGB 14.3 12/06/2018   HCT 43.9 12/06/2018   PLT 251.0 12/06/2018   GLUCOSE 84 12/06/2018   CHOL 160 06/07/2012   TRIG 101.0 06/07/2012   HDL 49.10 06/07/2012   LDLCALC 91 06/07/2012   ALT 25 12/06/2018   AST 22 12/06/2018   NA 138 12/06/2018   K 4.1 12/06/2018   CL 100 12/06/2018   CREATININE 0.79 12/06/2018   BUN 11 12/06/2018   CO2 29 12/06/2018   TSH 0.54 12/06/2018   HGBA1C 6.2 12/06/2018   MICROALBUR 0.1 06/07/2012    No results found.   Assessment & Plan:  Plan  I am having Jo Compton maintain her docusate sodium, hydrochlorothiazide, ofloxacin, ibuprofen, cefdinir, and meclizine.  No orders of the defined types were placed in this encounter.   Problem List Items Addressed This Visit      Unprioritized   Non-recurrent acute serous otitis media of left ear    Resolved        Other Visit Diagnoses    Enlarged thyroid gland    -  Primary   Relevant Orders   US THYROID      Follow-up: Return if symptoms worsen or fail to improve.  [name], DO

## 2018-12-26 NOTE — Assessment & Plan Note (Signed)
Resolved

## 2018-12-28 ENCOUNTER — Telehealth: Payer: Self-pay | Admitting: Family Medicine

## 2018-12-28 NOTE — Telephone Encounter (Signed)
Results are back but not reviewed.  Advised patient that she should get a call tomorrow.

## 2018-12-28 NOTE — Telephone Encounter (Signed)
Patient would like a call back regarding her [place] results she had on 12/26/18

## 2018-12-30 ENCOUNTER — Other Ambulatory Visit: Payer: Self-pay | Admitting: Family Medicine

## 2018-12-30 DIAGNOSIS — E041 Nontoxic single thyroid nodule: Secondary | ICD-10-CM

## 2018-12-30 NOTE — Telephone Encounter (Signed)
Patient called to request a call back today because she would like to discuss some test results that she was sure of the meaning. Please call patient at Ph#  [phone]

## 2019-01-09 ENCOUNTER — Other Ambulatory Visit: Payer: Self-pay | Admitting: Family Medicine

## 2019-01-09 ENCOUNTER — Telehealth: Payer: Self-pay | Admitting: Family Medicine

## 2019-01-09 MED ORDER — DILTIAZEM HCL POWD: 2 refills | Status: DC

## 2019-01-09 NOTE — Telephone Encounter (Signed)
Medication: DilTIAZem 2% cream.   Pharmacy:  [facility], [place] - [address] [phone] (Phone) [phone] (Fax)

## 2019-01-09 NOTE — Telephone Encounter (Signed)
Gi wrote -- we can refill but she should f/u with GI if problem persists

## 2019-01-09 NOTE — Telephone Encounter (Signed)
Please advise 

## 2019-01-09 NOTE — Telephone Encounter (Signed)
Diltiazem cream not on medication list. Review for refill

## 2019-01-10 ENCOUNTER — Other Ambulatory Visit: Payer: Self-pay | Admitting: Family Medicine

## 2019-01-10 DIAGNOSIS — H6502 Acute serous otitis media, left ear: Secondary | ICD-10-CM

## 2019-01-10 NOTE — Telephone Encounter (Signed)
Pt stated that the refill was sent to the wrong pharmacy. And it should be a cream. The [facility] doesn't make this cream and it needs to be sent as a cream to  [facility], [place] - [address] [phone] (Phone) [phone] (Fax)   Please advise when sent

## 2019-01-11 ENCOUNTER — Ambulatory Visit: Payer: No Typology Code available for payment source | Admitting: Internal Medicine

## 2019-01-11 ENCOUNTER — Other Ambulatory Visit: Payer: Self-pay

## 2019-01-11 MED ORDER — DILTIAZEM HCL POWD: 2 refills | Status: DC

## 2019-01-11 NOTE — Addendum Note (Signed)
Addended by: Sanda Linger on: 01/11/2019 09:26 AM   Modules accepted: Orders

## 2019-01-12 ENCOUNTER — Ambulatory Visit (INDEPENDENT_AMBULATORY_CARE_PROVIDER_SITE_OTHER): Payer: No Typology Code available for payment source | Admitting: Family Medicine

## 2019-01-12 ENCOUNTER — Encounter: Payer: Self-pay | Admitting: Nurse Practitioner

## 2019-01-12 ENCOUNTER — Ambulatory Visit (INDEPENDENT_AMBULATORY_CARE_PROVIDER_SITE_OTHER): Payer: No Typology Code available for payment source | Admitting: Nurse Practitioner

## 2019-01-12 ENCOUNTER — Encounter: Payer: Self-pay | Admitting: Family Medicine

## 2019-01-12 VITALS — BP 110/76 | HR 80 | Temp 98.1°F | Ht 66.0 in | Wt 211.1 lb

## 2019-01-12 DIAGNOSIS — H6690 Otitis media, unspecified, unspecified ear: Secondary | ICD-10-CM

## 2019-01-12 DIAGNOSIS — K625 Hemorrhage of anus and rectum: Secondary | ICD-10-CM | POA: Diagnosis not present

## 2019-01-12 DIAGNOSIS — K602 Anal fissure, unspecified: Secondary | ICD-10-CM | POA: Diagnosis not present

## 2019-01-12 DIAGNOSIS — K648 Other hemorrhoids: Secondary | ICD-10-CM

## 2019-01-12 DIAGNOSIS — K5909 Other constipation: Secondary | ICD-10-CM | POA: Diagnosis not present

## 2019-01-12 MED ORDER — CEFDINIR 300 MG PO CAPS: 300.0000 mg | ORAL_CAPSULE | Freq: Two times a day (BID) | ORAL | 0 refills | Status: DC

## 2019-01-12 MED ORDER — HYDROCORTISONE (PERIANAL) 2.5 % EX CREA: 1.0000 "application " | TOPICAL_CREAM | Freq: Every day | CUTANEOUS | 1 refills | Status: DC

## 2019-01-12 MED ORDER — OFLOXACIN 0.3 % OT SOLN: 10.0000 [drp] | Freq: Every day | OTIC | 0 refills | Status: DC

## 2019-01-12 NOTE — Progress Notes (Signed)
Virtual Visit via Video Note  I connected with Jo Compton on 01/12/19 at  4:00 PM EDT by a video enabled telemedicine application and verified that I am speaking with the correct person using two identifiers.  Location: Patient: home  Provider: office    I discussed the limitations of evaluation and management by telemedicine and the availability of in person appointments. The patient expressed understanding and agreed to proceed.  History of Present Illness: Pt is home c/o both ears hurting now.  It got better with abx but then with ear pods they started hurting again and now its both ears.  No fevers    Observations/Objective: No vitals obtained  Pt in NAD  Assessment and Plan: 1. Acute otitis media, unspecified otitis media type abx and ear gtts  if no better after this--- refer to ENT - cefdinir (OMNICEF) 300 MG capsule; Take 1 capsule (300 mg total) by mouth 2 (two) times daily.  Dispense: 20 capsule; Refill: 0 - ofloxacin (FLOXIN OTIC) 0.3 % OTIC solution; Place 10 drops into both ears daily.  Dispense: 10 mL; Refill: 0  Follow Up Instructions:    I discussed the assessment and treatment plan with the patient. The patient was provided an opportunity to ask questions and all were answered. The patient agreed with the plan and demonstrated an understanding of the instructions.   The patient was advised to call back or seek an in-person evaluation if the symptoms worsen or if the condition fails to improve as anticipated.  I provided 15 minutes of non-face-to-face time during this encounter.   [name], DO

## 2019-01-12 NOTE — Patient Instructions (Addendum)
If you are age 51 or older, your body mass index should be between 23-30. Your Body mass index is 34.08 kg/m. If this is out of the aforementioned range listed, please consider follow up with your Primary Care Provider.  If you are age 68 or younger, your body mass index should be between 19-25. Your Body mass index is 34.08 kg/m. If this is out of the aformentioned range listed, please consider follow up with your Primary Care Provider.   We have sent the following medications to your pharmacy for you to pick up at your convenience: Anusol cream  Continue Diltiazem Gel three times daily for 6 weeks.  Use stool softener daily (this is over-the-counter)   Follow up with me on February 22, 2019 at 10 am.  Thank you for choosing me and [facility].   Tye Savoy, NP

## 2019-01-12 NOTE — Progress Notes (Signed)
ASSESSMENT / PLAN:   53 yo female with minor rectal discomfort and minor rectal bleeding with bowel movements. She has a history of an anal fissure in 2014. Unable to appreciate fissure on exam but there was pain / tenderness of posterior midline.  She has inflamed internal hemorrhoids on anoscopy.  --Diltiazem gel TID x 6 weeks.  Apply inside anal canal up to first knuckle . --For internal hemorrhoids will treat with Anusol cream Q HS x 10 days.  --Recommend daily stool softener as they do seem to work well for her --Avoid anal intercourse for now --Follow-up in the office with me in 6 to 8-weeks --Her last colonoscopy was in 2014 but there is a question of whether a polyp was removed. At our follow up visit will probably arrange for colonoscopy if she is feeling better.   HPI:    Chief Complaint:   Anal fissure  53 year old female evaluated by Dr. Deatra Ina in 2014 for rectal bleeding.   He diagnosed her with an anal fissure.  Since then she has used Diltiazem gel periodically for rectal bleeding and discomfort. Last seen in office in 2014. Her last colonoscopy was around 2012 at outside facility, ? Polyp removed.   Dieu is back with minor rectal discomfort and bleeding. She may go months in between these episodes. She just picked up a refill on Diltiazem yesterday.  She is a little concerned that anal intercourse may have contributed to this most recent episode.  She does not typically practice anal intercourse. She sometimes passes hard stools. Stool softeners work well but she is concerned they may work at a socially inconveinent time . Patient has no GI concerns.   Past Medical History:  Diagnosis Date  . Anal fissure   . Anemia   . Depression   . Fistula    rectal  . OAB (overactive bladder)   . Rectal bleeding     Past Surgical History:  Procedure Laterality Date  . ABDOMINAL HYSTERECTOMY Bilateral 03/20/2015   Procedure: HYSTERECTOMY ABDOMINAL, EXCISION  MESENTERIC NODULE, BLADDER FLAP BIOPSY;  Surgeon: Ena Dawley, MD;  Location: [facility];  Service: Gynecology;  Laterality: Bilateral;  . APPENDECTOMY    . BILATERAL SALPINGECTOMY Bilateral 03/20/2015   Procedure: BILATERAL SALPINGECTOMY;  Surgeon: Ena Dawley, MD;  Location: [facility];  Service: Gynecology;  Laterality: Bilateral;  . COLONOSCOPY    . CYSTOSCOPY N/A 03/20/2015   Procedure: CYSTOSCOPY;  Surgeon: Ena Dawley, MD;  Location: [facility];  Service: Gynecology;  Laterality: N/A;  . LAPAROSCOPY N/A 03/20/2015   Procedure: LAPAROSCOPY DIAGNOSTIC;  Surgeon: Ena Dawley, MD;  Location: [facility];  Service: Gynecology;  Laterality: N/A;   Family History  Problem Relation Age of Onset  . Hypertension Mother   . COPD Mother   . Lung cancer Mother   . Colon cancer Maternal Uncle   . Liver cancer Maternal Uncle   . Hypertension Maternal Aunt   . Diabetes Maternal Aunt    Social History   Tobacco Use  . Smoking status: Never Smoker  . Smokeless tobacco: Never Used  Substance Use Topics  . Alcohol use: Yes    Alcohol/week: 1.0 standard drinks    Types: 1 Glasses of wine per week  . Drug use: No   Current Outpatient Medications  Medication Sig Dispense Refill  . dilTIAZem HCl POWD APPLY RECTALLY TWICE DAILY. 30 g 2  . docusate sodium (  COLACE) 100 MG capsule Take 1 capsule (100 mg total) by mouth 2 (two) times daily. 120 capsule 2  . hydrochlorothiazide (HYDRODIURIL) 25 MG tablet Take 1 tablet (25 mg total) by mouth daily. 90 tablet 3  . ibuprofen (ADVIL) 800 MG tablet Take 1 tablet (800 mg total) by mouth every 8 (eight) hours as needed. 30 tablet 0  . meclizine (ANTIVERT) 25 MG tablet Take 1 tablet (25 mg total) by mouth 3 (three) times daily as needed for dizziness. 30 tablet 0  . cefdinir (OMNICEF) 300 MG capsule Take 300 mg by mouth 2 (two) times daily.    . hydrocortisone (ANUSOL-HC) 2.5 % rectal cream Place 1 application rectally at bedtime. Use at bedtime for 10 days  30 g 1  . ofloxacin (FLOXIN OTIC) 0.3 % OTIC solution Place 10 drops into the left ear daily. (Patient not taking: Reported on 01/12/2019) 5 mL 0   No current facility-administered medications for this visit.    No Known Allergies   Review of Systems: All systems reviewed and negative except where noted in HPI.    Physical Exam:    Wt Readings from Last 3 Encounters:  01/12/19 211 lb 2 oz (95.8 kg)  12/26/18 211 lb 9.6 oz (96 kg)  12/06/18 210 lb 12.8 oz (95.6 kg)    BP 110/76 (BP Location: Left Arm, Patient Position: Sitting, Cuff Size: Normal)   Pulse 80   Temp 98.1 F (36.7 C)   Ht 5\' 6"  (1.676 m) Comment: height measured without shoes  Wt 211 lb 2 oz (95.8 kg)   LMP 02/14/2015 (Approximate)   BMI 34.08 kg/m  Constitutional:  Pleasant female in no acute distress. Psychiatric: Normal mood and affect. Behavior is normal. EENT: Pupils normal.  Conjunctivae are normal. No scleral icterus. Neck supple.  Cardiovascular: Normal rate, regular rhythm. No edema Pulmonary/chest: Effort normal and breath sounds normal. No wheezing, rales or rhonchi. Abdominal: Soft, nondistended, nontender. Bowel sounds active throughout. There are no masses palpable. No hepatomegaly. Rectal: Perianal hemorrhoid tags.  On anoscopy there were inflamed internal hemorrhoids.  I could not appreciate a fissure though she had discomfort in posterior midline Neurological: Alert and oriented to person place and time. Skin: Skin is warm and dry. No rashes noted.  Tye Savoy, NP  01/12/2019, 10:55 AM

## 2019-02-10 ENCOUNTER — Ambulatory Visit: Payer: No Typology Code available for payment source | Admitting: Internal Medicine

## 2019-02-20 ENCOUNTER — Encounter: Payer: Self-pay | Admitting: Internal Medicine

## 2019-02-22 ENCOUNTER — Ambulatory Visit: Payer: No Typology Code available for payment source | Admitting: Nurse Practitioner

## 2019-03-03 ENCOUNTER — Encounter: Payer: Self-pay | Admitting: Nurse Practitioner

## 2019-03-06 ENCOUNTER — Other Ambulatory Visit: Payer: Self-pay

## 2019-03-06 ENCOUNTER — Telehealth: Payer: Self-pay | Admitting: Family Medicine

## 2019-03-06 DIAGNOSIS — R202 Paresthesia of skin: Secondary | ICD-10-CM

## 2019-03-06 DIAGNOSIS — R200 Anesthesia of skin: Secondary | ICD-10-CM

## 2019-03-06 DIAGNOSIS — M79604 Pain in right leg: Secondary | ICD-10-CM

## 2019-03-06 NOTE — Telephone Encounter (Signed)
Patient is calling regarding a referral for leg pain in both legs that goes up to her hip area. Patient discussed this with Dr. Carollee Herter in 2018. However, never when to [facility].Patient is requesting a referral to be placed to [facility] at this time.  Please advise Cb- [phone]

## 2019-03-06 NOTE — Telephone Encounter (Signed)
Ok to place

## 2019-03-06 NOTE — Telephone Encounter (Signed)
Okay to place referral

## 2019-03-07 NOTE — Telephone Encounter (Signed)
Patient notified that referral was placed and they will call her to get her set up with an appt.

## 2019-03-07 NOTE — Telephone Encounter (Signed)
Referral placed.

## 2019-03-07 NOTE — Telephone Encounter (Signed)
Called pt to let her know the referral was placed but phone kept ringing. Never got a VM

## 2019-03-08 NOTE — Progress Notes (Signed)
Name: Jo Compton  MRN/ DOB: [id number], Dec 13, 1965    Age/ Sex: 53 y.o., female    PCP: [name], DO   Reason for Endocrinology Evaluation: Thyroid nodule      Date of Initial Endocrinology Evaluation: 03/09/2019     HPI: [name] is a 53 y.o. female with a past medical history of HTN. The patient presented for initial endocrinology clinic visit on 03/09/2019 for consultative assistance with her Thyroid nodule    She was noted by her dentist to have thyromegaly, which prompted a thyroid ultrasound   She denies noticing  anyThyroid enlargement, has occasional discomfort, no dysphagia, no SOB.  She has been losing weight, she has chronic constipation but no symptoms of hyper or hypothyroidism  No prior exposure to radiation   No Fh of thyroid disease    HISTORY:  Past Medical History:  Past Medical History:  Diagnosis Date  . Anal fissure   . Anemia   . Depression   . Fistula    rectal  . OAB (overactive bladder)   . Rectal bleeding    Past Surgical History:  Past Surgical History:  Procedure Laterality Date  . ABDOMINAL HYSTERECTOMY Bilateral 03/20/2015   Procedure: HYSTERECTOMY ABDOMINAL, EXCISION MESENTERIC NODULE, BLADDER FLAP BIOPSY;  Surgeon: Ena Dawley, MD;  Location: [facility];  Service: Gynecology;  Laterality: Bilateral;  . APPENDECTOMY    . BILATERAL SALPINGECTOMY Bilateral 03/20/2015   Procedure: BILATERAL SALPINGECTOMY;  Surgeon: Ena Dawley, MD;  Location: [facility];  Service: Gynecology;  Laterality: Bilateral;  . COLONOSCOPY    . CYSTOSCOPY N/A 03/20/2015   Procedure: CYSTOSCOPY;  Surgeon: Ena Dawley, MD;  Location: [facility];  Service: Gynecology;  Laterality: N/A;  . LAPAROSCOPY N/A 03/20/2015   Procedure: LAPAROSCOPY DIAGNOSTIC;  Surgeon: Ena Dawley, MD;  Location: [facility];  Service: Gynecology;  Laterality: N/A;      Social History:  reports that she has never smoked. She has never used smokeless tobacco.  She reports current alcohol use of about 1.0 standard drinks of alcohol per week. She reports that she does not use drugs.  Family History: family history includes COPD in her mother; Colon cancer in her maternal uncle; Diabetes in her maternal aunt; Hypertension in her maternal aunt and mother; Liver cancer in her maternal uncle; Lung cancer in her mother.   HOME MEDICATIONS: Allergies as of 03/09/2019   No Known Allergies     Medication List       Accurate as of March 09, 2019  8:07 AM. If you have any questions, ask your nurse or doctor.        STOP taking these medications   cefdinir 300 MG capsule Commonly known as: OMNICEF Stopped by: Dorita Sciara, MD     TAKE these medications   dilTIAZem HCl Powd APPLY RECTALLY TWICE DAILY.   docusate sodium 100 MG capsule Commonly known as: Colace Take 1 capsule (100 mg total) by mouth 2 (two) times daily.   hydrochlorothiazide 25 MG tablet Commonly known as: HYDRODIURIL Take 1 tablet (25 mg total) by mouth daily.   hydrocortisone 2.5 % rectal cream Commonly known as: ANUSOL-HC Place 1 application rectally at bedtime. Use at bedtime for 10 days   ibuprofen 800 MG tablet Commonly known as: ADVIL Take 1 tablet (800 mg total) by mouth every 8 (eight) hours as needed.   meclizine 25 MG tablet Commonly known as: ANTIVERT Take 1 tablet (25 mg total) by mouth 3 (  three) times daily as needed for dizziness.   ofloxacin 0.3 % OTIC solution Commonly known as: Floxin Otic Place 10 drops into both ears daily. What changed:   when to take this  reasons to take this  Another medication with the same name was removed. Continue taking this medication, and follow the directions you see here.         REVIEW OF SYSTEMS: A comprehensive ROS was conducted with the patient and is negative except as per HPI and below:  Review of Systems  Constitutional: Negative for fever and weight loss.  HENT: Negative for congestion  and sore throat.   Respiratory: Negative for cough and shortness of breath.   Cardiovascular: Negative for chest pain and palpitations.  Gastrointestinal: Positive for constipation. Negative for nausea.       Chronic   Neurological: Negative for tingling and tremors.  Psychiatric/Behavioral: Negative for depression. The patient is not nervous/anxious.        OBJECTIVE:  VS: BP 118/88 (BP Location: Left Arm, Patient Position: Sitting, Cuff Size: Normal)   Pulse 76   Ht 5\' 6"  (1.676 m)   Wt 207 lb (93.9 kg)   LMP 02/14/2015 (Approximate)   SpO2 98%   BMI 33.41 kg/m    Wt Readings from Last 3 Encounters:  03/09/19 207 lb (93.9 kg)  01/12/19 211 lb 2 oz (95.8 kg)  12/26/18 211 lb 9.6 oz (96 kg)     EXAM: General: Pt appears well and is in NAD  Hydration: Well-hydrated with moist mucous membranes and good skin turgor  Eyes: External eye exam normal without stare, lid lag or exophthalmos.  EOM intact.  PERRL.  Ears, Nose, Throat: Hearing: Grossly intact bilaterally Dental: Good dentition  Throat: Clear without mass, erythema or exudate  Neck: General: Supple without adenopathy. Thyroid: Thyroid size normal.  No goiter or nodules appreciated. No thyroid bruit.  Lungs: Clear with good BS bilat with no rales, rhonchi, or wheezes  Heart: Auscultation: RRR.  Abdomen: Normoactive bowel sounds, soft, nontender, without masses or organomegaly palpable  Extremities: Gait and station: Normal gait  Digits and nails: No clubbing, cyanosis, petechiae, or nodes Head and neck: Normal alignment and mobility BL UE: Normal ROM and strength. BL LE: No pretibial edema normal ROM and strength.  Skin: Hair: Texture and amount normal with gender appropriate distribution Skin Inspection: No rashes, acanthosis nigricans/skin tags. No lipohypertrophy Skin Palpation: Skin temperature, texture, and thickness normal to palpation  Neuro: Cranial nerves: II - XII grossly intact  Cerebellar: Normal  coordination and movement; no tremor Motor: Normal strength throughout DTRs: 2+ and symmetric in UE without delay in relaxation phase  Mental Status: Judgment, insight: Intact Orientation: Oriented to time, place, and person Memory: Intact for recent and remote events Mood and affect: No depression, anxiety, or agitation     DATA REVIEWED: Results for VALORA, ROJEK (MRN [id number]) as of 03/08/2019 09:34  Ref. Range 12/06/2018 15:38  TSH Latest Ref Range: 0.35 - 4.50 uIU/mL 0.54    Thyroid Ultrasound 12/26/2018   Nodule # 1:  Location: Right; Inferior  Maximum size: 0.6 cm; Other 2 dimensions: 0.4 x 0.5 cm  Composition: solid/almost completely solid (2)  Echogenicity: hypoechoic (2)  Shape: not taller-than-wide (0)  Margins: smooth (0)  Echogenic foci: none (0)  ACR TI-RADS total points: 4.  ACR TI-RADS risk category: TR4 (4-6 points).  ACR TI-RADS recommendations:  Given size (<0.9 cm) and appearance, this nodule does NOT meet TI-RADS criteria for biopsy  or dedicated follow-up.  _________________________________________________________  Nodule # 2:  Location: Isthmus  Maximum size: 0.6 cm; Other 2 dimensions: 0.3 x 0.6 cm  Composition: cystic/almost completely cystic (0)  Echogenicity: anechoic (0)  Shape: not taller-than-wide (0)  Margins: smooth (0)  Echogenic foci: none (0)  ACR TI-RADS total points: 0.  ACR TI-RADS risk category: TR1 (0-1 points).  ACR TI-RADS recommendations:  This nodule does NOT meet TI-RADS criteria for biopsy or dedicated follow-up.  _________________________________________________________  Nodule # 3:  Location: Left; Mid  Maximum size: 0.8 cm; Other 2 dimensions: 0.4 x 0.6 cm  Composition: solid/almost completely solid (2)  Echogenicity: hypoechoic (2)  Shape: not taller-than-wide (0)  Margins: smooth (0)  Echogenic foci: none (0)  ACR TI-RADS total points: 4.  ACR  TI-RADS risk category: TR4 (4-6 points).  ACR TI-RADS recommendations:  Given size (<0.9 cm) and appearance, this nodule does NOT meet TI-RADS criteria for biopsy or dedicated follow-up.  _________________________________________________________  Nodule # 4:  Location: Left; Inferior  Maximum size: 0.9 cm; Other 2 dimensions: 0.8 x 0.5 cm  Composition: solid/almost completely solid (2)  Echogenicity: hypoechoic (2)  Shape: not taller-than-wide (0)  Margins: smooth (0)  Echogenic foci: none (0)  ACR TI-RADS total points: 4.  ACR TI-RADS risk category: TR4 (4-6 points).  ACR TI-RADS recommendations:  Given size (<0.9 cm) and appearance, this nodule does NOT meet TI-RADS criteria for biopsy or dedicated follow-up.  _________________________________________________________  No abnormal lymph nodes identified.  IMPRESSION: Mildly enlarged thyroid gland. Four measurable small nodules, 3 of which are solid and 1 representing a simple cyst. None of these meet criteria for biopsy or dedicated follow-up.   ASSESSMENT/PLAN/RECOMMENDATIONS:   1. Multinodular Goiter :   - Pt is clinically and biochemically euthyroid  - No local neck symptoms - Reassurance provided at this time that all her thyroid  Nodules are sub centimeter and do not require any further follow up   F/U in 1 yr or as needed   Signed electronically by: Mack Guise, MD  [facility] Endocrinology  [facility] [address] [place], [place] [address] Phone: [phone] FAX: [phone]   CC: [name], DO [address] [place] [address] Phone: [phone] Fax: [phone]   Return to Endocrinology clinic as below: Future Appointments  Date Time Provider [place]  03/14/2019  1:30 PM Willia Craze, NP [facility]  03/14/2020  8:30 AM Darleene Cumpian, Melanie Crazier, MD [facility] None

## 2019-03-09 ENCOUNTER — Ambulatory Visit: Payer: No Typology Code available for payment source | Admitting: Internal Medicine

## 2019-03-09 ENCOUNTER — Other Ambulatory Visit: Payer: Self-pay

## 2019-03-09 ENCOUNTER — Encounter: Payer: Self-pay | Admitting: Internal Medicine

## 2019-03-09 VITALS — BP 118/88 | HR 76 | Ht 66.0 in | Wt 207.0 lb

## 2019-03-09 DIAGNOSIS — E042 Nontoxic multinodular goiter: Secondary | ICD-10-CM | POA: Diagnosis not present

## 2019-03-09 NOTE — Patient Instructions (Signed)
-   Thyroid nodules are a very common condition, your nodules are very reassuring and do not require any intervention at this time.

## 2019-03-14 ENCOUNTER — Other Ambulatory Visit: Payer: Self-pay

## 2019-03-14 ENCOUNTER — Encounter: Payer: Self-pay | Admitting: Nurse Practitioner

## 2019-03-14 ENCOUNTER — Ambulatory Visit (INDEPENDENT_AMBULATORY_CARE_PROVIDER_SITE_OTHER): Payer: No Typology Code available for payment source | Admitting: Nurse Practitioner

## 2019-03-14 VITALS — BP 110/60 | HR 67 | Temp 97.4°F | Ht 66.0 in | Wt 207.0 lb

## 2019-03-14 DIAGNOSIS — K648 Other hemorrhoids: Secondary | ICD-10-CM | POA: Diagnosis not present

## 2019-03-14 DIAGNOSIS — K625 Hemorrhage of anus and rectum: Secondary | ICD-10-CM

## 2019-03-14 MED ORDER — HYDROCORTISONE (PERIANAL) 2.5 % EX CREA: 1.0000 "application " | TOPICAL_CREAM | Freq: Every day | CUTANEOUS | 0 refills | Status: DC

## 2019-03-14 NOTE — Progress Notes (Signed)
Chief Complaint:   followe up   IMPRESSION and PLAN:    32.  53 year old female with rectal bleeding.  Treated empirically for fissure and also for internal hemorrhoids seen on anoscopy.  Did not complete fissure treatment but her rectal pain and tenderness have resolved despite some ongoing bleeding after 2 weeks of hemorrhoid treatment. --For further evaluation patient will be scheduled for colonoscopy.  The risks and benefits of colonoscopy with possible polypectomy / biopsies were discussed and the patient agrees to proceed.  --She can hold off on Anusol cream and topical diltiazem for now.  Most important thing is to keep her bowels soft.  She is doing much better on twice daily Colace plus Metamucil  2.  History of colon polyps in 2014.  Patient does not know that she could obtain the report.  --At this point given probable polyp history and rectal bleeding will proceed with colonoscopy  HPI:     Patient is a 53 year old female who I saw mid September for minor rectal bleeding and discomfort with bowel movements.  She had inflamed internal hemorrhoids on anoscopy .  I was unable to appreciate a fissure on exam but she had pain/tenderness at the posterior midline and had a history of an anal fissure in 2014.  Consequently I prescribed Anusol cream as well as topical diltiazem 3 times daily.  Jo Compton is back for follow-up.  She used the topical diltiazem for about a week then stopped because she really thought the bleeding was from hemorrhoids.  She continued the Anusol cream for about 2 weeks.  Symptoms resolved but then she had some minor recurrent rectal bleeding a few times.  The bleeding was in the setting of straining.  She has been taking twice daily Colace.  After adding Metamucil a few days ago her bowel movements have significantly improved  Review of systems:     No chest pain, no SOB, no fevers, no urinary sx   Past Medical History:  Diagnosis Date  . Anal fissure   .  Anemia   . Depression   . Fistula    rectal  . OAB (overactive bladder)   . Rectal bleeding     Patient's surgical history, family medical history, social history, medications and allergies were all reviewed in Epic    Current Outpatient Medications  Medication Sig Dispense Refill  . dilTIAZem HCl POWD APPLY RECTALLY TWICE DAILY. 30 g 2  . docusate sodium (COLACE) 100 MG capsule Take 1 capsule (100 mg total) by mouth 2 (two) times daily. 120 capsule 2  . hydrochlorothiazide (HYDRODIURIL) 25 MG tablet Take 1 tablet (25 mg total) by mouth daily. 90 tablet 3  . hydrocortisone (ANUSOL-HC) 2.5 % rectal cream Place 1 application rectally at bedtime. Use at bedtime for 10 days 30 g 1  . ibuprofen (ADVIL) 800 MG tablet Take 1 tablet (800 mg total) by mouth every 8 (eight) hours as needed. 30 tablet 0  . meclizine (ANTIVERT) 25 MG tablet Take 1 tablet (25 mg total) by mouth 3 (three) times daily as needed for dizziness. 30 tablet 0  . ofloxacin (FLOXIN OTIC) 0.3 % OTIC solution Place 10 drops into both ears daily. (Patient taking differently: Place 10 drops into both ears daily as needed. ) 10 mL 0   No current facility-administered medications for this visit.     Physical Exam:     BP 110/60   Pulse 67   Temp (!) 97.4 F (36.3  C)   Ht 5\' 6"  (1.676 m)   Wt 207 lb (93.9 kg)   LMP 02/14/2015 (Approximate)   BMI 33.41 kg/m   GENERAL:  Pleasant female in NAD PSYCH: : Cooperative, normal affect PULM: Normal respiratory effort RECTAL: Fleshy perianal skin tags.  Nontender on DRE this time speaking against a fissure.  Tye Savoy , NP 03/14/2019, 1:56 PM

## 2019-03-14 NOTE — Patient Instructions (Addendum)
If you are age 51 or older, your body mass index should be between 23-30. Your Body mass index is 33.41 kg/m. If this is out of the aforementioned range listed, please consider follow up with your Primary Care Provider.  If you are age 33 or younger, your body mass index should be between 19-25. Your Body mass index is 33.41 kg/m. If this is out of the aformentioned range listed, please consider follow up with your Primary Care Provider.   We have sent the following medications to your pharmacy for you to pick up at your convenience: Anusol cream  Continue Metamucil and Colace.  Call back in a couple of weeks to schedule a colonoscopy at [facility] with Dr. Carlean Purl in January 2021 as the schedule is not available at this time.  Thank you for choosing me and [facility].   Tye Savoy, NP

## 2019-05-12 ENCOUNTER — Encounter: Payer: Self-pay | Admitting: Family Medicine

## 2019-05-12 ENCOUNTER — Other Ambulatory Visit: Payer: Self-pay

## 2019-05-12 ENCOUNTER — Ambulatory Visit (INDEPENDENT_AMBULATORY_CARE_PROVIDER_SITE_OTHER): Payer: No Typology Code available for payment source | Admitting: Family Medicine

## 2019-05-12 VITALS — BP 136/56 | Temp 97.4°F

## 2019-05-12 DIAGNOSIS — J40 Bronchitis, not specified as acute or chronic: Secondary | ICD-10-CM | POA: Diagnosis not present

## 2019-05-12 DIAGNOSIS — J069 Acute upper respiratory infection, unspecified: Secondary | ICD-10-CM | POA: Diagnosis not present

## 2019-05-12 MED ORDER — FLUTICASONE PROPIONATE 50 MCG/ACT NA SUSP: 2.0000 | Freq: Every day | NASAL | 6 refills | Status: DC

## 2019-05-12 MED ORDER — AZITHROMYCIN 250 MG PO TABS: ORAL_TABLET | ORAL | 0 refills | Status: DC

## 2019-05-12 MED ORDER — BENZONATATE 100 MG PO CAPS: 200.0000 mg | ORAL_CAPSULE | Freq: Three times a day (TID) | ORAL | 0 refills | Status: DC | PRN

## 2019-05-12 NOTE — Progress Notes (Signed)
Virtual Visit via Video Note  I connected with Jo Compton on 05/12/19 at  1:20 PM EST by a video enabled telemedicine application and verified that I am speaking with the correct person using two identifiers.  Location: Patient: home alone Provider: home    I discussed the limitations of evaluation and management by telemedicine and the availability of in person appointments. The patient expressed understanding and agreed to proceed.  History of Present Illness: Pt is home and had the covid vaccine on Monday and Wednesday she started with chills, nasal congestion , cough , fatigue -- she was tested for covid and it was negative -- she is starting to feel better but still has cough and congestion  No fevers    Observations/Objective: Vitals:   05/12/19 1303  BP: (!) 136/56  Temp: (!) 97.4 F (36.3 C)   Pt is in NAD  Assessment and Plan: 1. Bronchitis abx and cough med per orders Call back if symptoms worsen  - azithromycin (ZITHROMAX Z-PAK) 250 MG tablet; As directed  Dispense: 6 each; Refill: 0 - benzonatate (TESSALON PERLES) 100 MG capsule; Take 2 capsules (200 mg total) by mouth 3 (three) times daily as needed for cough.  Dispense: 20 capsule; Refill: 0  2. Viral upper respiratory tract infection flonase otc antihistamine prn  - fluticasone (FLONASE) 50 MCG/ACT nasal spray; Place 2 sprays into both nostrils daily.  Dispense: 16 g; Refill: 6   Follow Up Instructions:    I discussed the assessment and treatment plan with the patient. The patient was provided an opportunity to ask questions and all were answered. The patient agreed with the plan and demonstrated an understanding of the instructions.   The patient was advised to call back or seek an in-person evaluation if the symptoms worsen or if the condition fails to improve as anticipated.    [name], DO

## 2019-05-15 ENCOUNTER — Telehealth: Payer: Self-pay | Admitting: *Deleted

## 2019-05-15 NOTE — Telephone Encounter (Signed)
Contact Type Call Who Is Calling Patient / Member / Family / Caregiver Call Type Triage / Clinical Relationship To Patient Self Return Phone Number [phone] (Primary) Chief Complaint Nasal Congestion Reason for Call Symptomatic / Request for Health Information Initial Comment Caller states she had a virtual visit with Dr. Etter Sjogren on Thursday and she called her in a z pack but she has lost the medication and would like to know if the doctor can call her in another rx. Caller states she has congestion and body aches. Translation No Nurse Assessment Nurse: Gilford Rile, RN, Adela Lank Date/Time (Eastern Time): 05/14/2019 7:49:02 PM Confirm and document reason for call. If symptomatic, describe symptoms. ---Caller states that she has nasal congestion and sinus pressure and she had televisit with the doctor on Thursday. Caller states that she was prescribed a z-pack and a nasal spray. Caller states that she lost her antibiotic and and needs a new z-pack.

## 2019-05-16 ENCOUNTER — Ambulatory Visit: Payer: Self-pay | Admitting: *Deleted

## 2019-05-16 ENCOUNTER — Other Ambulatory Visit: Payer: Self-pay | Admitting: Family Medicine

## 2019-05-16 DIAGNOSIS — J40 Bronchitis, not specified as acute or chronic: Secondary | ICD-10-CM

## 2019-05-16 MED ORDER — AZITHROMYCIN 250 MG PO TABS: ORAL_TABLET | ORAL | 0 refills | Status: DC

## 2019-05-16 NOTE — Telephone Encounter (Signed)
Pt called 2nd time to check status of request below

## 2019-05-16 NOTE — Telephone Encounter (Signed)
Calls with severe sinus pressure around the nose and eyes. Congestion and Headache present. Denies fever/SOB. Had virtual visit 01/14 with Dr. Carollee Herter and prescribed zpack and flonase. Reported she will complete abx tomorrow. Experiencing lightheadedness with the sinus pressure.Voids without difficulty today. No dry mouth.  Care advice including increase water intake, use NS nasal spray several times daily, tylenol for HA. Routing to physician for further advice.  Pharmacy on file, [facility] on [address].   Reason for Disposition . [1] MODERATE dizziness (e.g., interferes with normal activities) AND [2] has been evaluated by physician for this  Answer Assessment - Initial Assessment Questions 1. DESCRIPTION: "Describe your dizziness."    Woozy headed 2. LIGHTHEADED: "Do you feel lightheaded?" (e.g., somewhat faint, woozy, weak upon standing)    woozy 3. VERTIGO: "Do you feel like either you or the room is spinning or tilting?" (i.e. vertigo)     no 4. SEVERITY: "How bad is it?"  "Do you feel like you are going to faint?" "Can you stand and walk?"   - MILD - walking normally   - MODERATE - interferes with normal activities (e.g., work, school)    - SEVERE - unable to stand, requires support to walk, feels like passing out now.      moderate 5. ONSET:  "When did the dizziness begin?"    Started Saturday 6. AGGRAVATING FACTORS: "Does anything make it worse?" (e.g., standing, change in head position)     no 7. HEART RATE: "Can you tell me your heart rate?" "How many beats in 15 seconds?"  (Note: not all patients can do this)       No. Has not noticed any changes with her heart rate 8. CAUSE: "What do you think is causing the dizziness?"     Sinus pressure 9. RECURRENT SYMPTOM: "Have you had dizziness before?" If so, ask: "When was the last time?" "What happened that time?"     no 10. OTHER SYMPTOMS: "Do you have any other symptoms?" (e.g., fever, chest pain, vomiting, diarrhea, bleeding)    none 11. PREGNANCY: "Is there any chance you are pregnant?" "When was your last menstrual period?"       no  Protocols used: DIZZINESS [facility]

## 2019-05-16 NOTE — Telephone Encounter (Signed)
Another one called in Pt should use flonase otc as well Can call in astelin 1 spray each nostril bid if she agrees  #1  3 refills

## 2019-05-16 NOTE — Telephone Encounter (Signed)
Please see below.

## 2019-05-16 NOTE — Telephone Encounter (Signed)
Please advise 

## 2019-05-17 MED ORDER — AZELASTINE HCL 0.1 % NA SOLN: 2.0000 | Freq: Two times a day (BID) | NASAL | 1 refills | Status: DC

## 2019-05-17 NOTE — Telephone Encounter (Signed)
Nasal spray sent in

## 2019-05-17 NOTE — Telephone Encounter (Signed)
Pt called in to get info on medication. Advised her zpack was sent in 1/19. Pt does want to try the Astelin nasal spray. She said she doesn't like the flonase as it burns.

## 2019-05-17 NOTE — Addendum Note (Signed)
Addended by: Sanda Linger on: 05/17/2019 12:48 PM   Modules accepted: Orders

## 2019-05-18 ENCOUNTER — Other Ambulatory Visit: Payer: Self-pay

## 2019-05-18 ENCOUNTER — Emergency Department (HOSPITAL_BASED_OUTPATIENT_CLINIC_OR_DEPARTMENT_OTHER)
Admission: EM | Admit: 2019-05-18 | Discharge: 2019-05-18 | Disposition: A | Discharge status: Home / Self Care | Payer: No Typology Code available for payment source | Attending: Emergency Medicine | Admitting: Emergency Medicine

## 2019-05-18 ENCOUNTER — Encounter (HOSPITAL_BASED_OUTPATIENT_CLINIC_OR_DEPARTMENT_OTHER): Payer: Self-pay | Admitting: *Deleted

## 2019-05-18 DIAGNOSIS — I10 Essential (primary) hypertension: Secondary | ICD-10-CM | POA: Insufficient documentation

## 2019-05-18 DIAGNOSIS — U071 COVID-19: Secondary | ICD-10-CM | POA: Diagnosis not present

## 2019-05-18 DIAGNOSIS — Z79899 Other long term (current) drug therapy: Secondary | ICD-10-CM | POA: Insufficient documentation

## 2019-05-18 DIAGNOSIS — R42 Dizziness and giddiness: Secondary | ICD-10-CM

## 2019-05-18 LAB — BASIC METABOLIC PANEL
Anion gap: 10 (ref 5–15)
BUN: 12 mg/dL (ref 6–20)
CO2: 26 mmol/L (ref 22–32)
Calcium: 9.3 mg/dL (ref 8.9–10.3)
Chloride: 102 mmol/L (ref 98–111)
Creatinine, Ser: 0.87 mg/dL (ref 0.44–1.00)
GFR calc Af Amer: >60 mL/min (ref >60)
GFR calc non Af Amer: >60 mL/min (ref >60)
Glucose, Bld: 106 mg/dL — ABNORMAL HIGH (ref 70–99)
Potassium: 3.6 mmol/L (ref 3.5–5.1)
Sodium: 138 mmol/L (ref 135–145)

## 2019-05-18 LAB — CBC
HCT: 46.5 % — ABNORMAL HIGH (ref 36.0–46.0)
Hemoglobin: 14.8 g/dL (ref 12.0–15.0)
MCH: 24.9 pg — ABNORMAL LOW (ref 26.0–34.0)
MCHC: 31.8 g/dL (ref 30.0–36.0)
MCV: 78.2 fL — ABNORMAL LOW (ref 80.0–100.0)
Platelets: 253 10*3/uL (ref 150–400)
RBC: 5.95 MIL/uL — ABNORMAL HIGH (ref 3.87–5.11)
RDW: 13.6 % (ref 11.5–15.5)
WBC: 6.8 10*3/uL (ref 4.0–10.5)
nRBC: 0 % (ref 0.0–0.2)

## 2019-05-18 MED ORDER — MECLIZINE HCL 25 MG PO TABS
25.0000 mg | ORAL_TABLET | Freq: Once | ORAL | Status: DC
  Filled 2019-05-18: qty 1

## 2019-05-18 MED ORDER — MECLIZINE HCL 25 MG PO TABS: 25.0000 mg | ORAL_TABLET | Freq: Three times a day (TID) | ORAL | 0 refills | Status: DC | PRN

## 2019-05-18 MED ORDER — ONDANSETRON 8 MG PO TBDP: 8.0000 mg | ORAL_TABLET | Freq: Three times a day (TID) | ORAL | 0 refills | Status: DC | PRN

## 2019-05-18 NOTE — ED Triage Notes (Signed)
Pt received Covid Vaccine Monday, Jan 11.  Started feeling sx 2 days later - Dizziness, runny nose, cough. Tested positive for Covid Sunday, Jan 17.  Pt sts all sx have subsided except she is still dizzy.  She sts she checked her bp and HR at home this morning and they were both high so she came to ED fearing she would have a stroke.

## 2019-05-18 NOTE — ED Provider Notes (Signed)
[facility] EMERGENCY DEPARTMENT Provider Note   CSN: [id number] Arrival date & time: 05/18/19  0825     History Chief Complaint  Patient presents with  . Covid +, Dizziness and HTN    Jo Compton is a 54 y.o. female.  HPI   Patient presents the emergency room for evaluation of dizziness.  Patient states she started having some URI symptoms about a week ago.  Patient states she did have a Covid vaccination on Monday and started having some symptoms a couple days after that.  Patient had a video visit with her doctor.  Patient was clinically diagnosed with bronchitis and was started on azithromycin.  Patient states she was subsequently tested at work for Covid 19 and was told that it was positive.  Patient continues to have some sinus congestion.  Patient also states she now has lack of taste and smell.  The main reason for coming to the ED the however was this past week she has been having dizziness.  Patient notes that when she turns her head she starts feeling dizzy and the room spins.  She denies any trouble with her headache.  No trouble with her speech or strength.  This morning the symptoms persisted and she was concerned she was having blood pressure issues so she came to the ED.  Past Medical History:  Diagnosis Date  . Anal fissure   . Anemia   . Depression   . Fistula    rectal  . OAB (overactive bladder)   . Rectal bleeding     Patient Active Problem List   Diagnosis Date Noted  . Multinodular goiter 03/09/2019  . Leg cramps 11/25/2018  . Claudication ([place]) 11/10/2018  . Wound infection 01/08/2018  . SOB (shortness of breath) 04/15/2017  . 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 Edema, lower extremity 11/08/2016  . Menorrhagia 03/20/2015  . Depression 12/05/2014  . Anemia, iron deficiency 08/03/2013  . Anal fissure 01/17/2013  . Hemorrhage of rectum and anus 01/17/2013  . Obesity (BMI 30-39.9) 12/17/2012  . HTN (hypertension) 08/10/2012      Past Surgical History:  Procedure Laterality Date  . ABDOMINAL HYSTERECTOMY Bilateral 03/20/2015   Procedure: HYSTERECTOMY ABDOMINAL, EXCISION MESENTERIC NODULE, BLADDER FLAP BIOPSY;  Surgeon: Ena Dawley, MD;  Location: [facility];  Service: Gynecology;  Laterality: Bilateral;  . APPENDECTOMY    . BILATERAL SALPINGECTOMY Bilateral 03/20/2015   Procedure: BILATERAL SALPINGECTOMY;  Surgeon: Ena Dawley, MD;  Location: [facility];  Service: Gynecology;  Laterality: Bilateral;  . COLONOSCOPY    . CYSTOSCOPY N/A 03/20/2015   Procedure: CYSTOSCOPY;  Surgeon: Ena Dawley, MD;  Location: [facility];  Service: Gynecology;  Laterality: N/A;  . LAPAROSCOPY N/A 03/20/2015   Procedure: LAPAROSCOPY DIAGNOSTIC;  Surgeon: Ena Dawley, MD;  Location: [facility];  Service: Gynecology;  Laterality: N/A;     OB History    Gravida  1   Para  1   Term      Preterm      AB      Living        SAB      TAB      Ectopic      Multiple      Live Births              Family History  Problem Relation Age of Onset  . Hypertension Mother   . COPD Mother   . Lung cancer Mother   . Colon cancer  Maternal Uncle   . Liver cancer Maternal Uncle   . Hypertension Maternal Aunt   . Diabetes Maternal Aunt     Social History   Tobacco Use  . Smoking status: Never Smoker  . Smokeless tobacco: Never Used  Substance Use Topics  . Alcohol use: Yes    Alcohol/week: 1.0 standard drinks    Types: 1 Glasses of wine per week  . Drug use: No    Home Medications Prior to Admission medications   Medication Sig Start Date End Date Taking? Authorizing Provider  azelastine (ASTELIN) 0.1 % nasal spray Place 2 sprays into both nostrils 2 (two) times daily. Use in each nostril as directed 05/17/19   Carollee Herter, Alferd Apa, DO  azithromycin (ZITHROMAX Z-PAK) 250 MG tablet As directed 05/16/19   Carollee Herter, Alferd Apa, DO  benzonatate (TESSALON PERLES) 100 MG capsule Take 2 capsules (200 mg total) by mouth  3 (three) times daily as needed for cough. 05/12/19   Roma Schanz R, DO  dilTIAZem HCl POWD APPLY RECTALLY TWICE DAILY. 01/11/19   [name], DO  docusate sodium (COLACE) 100 MG capsule Take 1 capsule (100 mg total) by mouth 2 (two) times daily. 03/22/15   Ena Dawley, MD  fluticasone [facility]) 50 MCG/ACT nasal spray Place 2 sprays into both nostrils daily. 05/12/19   [name], DO  hydrochlorothiazide (HYDRODIURIL) 25 MG tablet Take 1 tablet (25 mg total) by mouth daily. 11/10/18   [name], DO  ibuprofen (ADVIL) 800 MG tablet Take 1 tablet (800 mg total) by mouth every 8 (eight) hours as needed. 11/25/18   [name], DO  meclizine (ANTIVERT) 25 MG tablet Take 1 tablet (25 mg total) by mouth 3 (three) times daily as needed for dizziness. 05/18/19   Dorie Rank, MD  ondansetron (ZOFRAN ODT) 8 MG disintegrating tablet Take 1 tablet (8 mg total) by mouth every 8 (eight) hours as needed for nausea or vomiting. 05/18/19   Dorie Rank, MD    Allergies    Patient has no known allergies.  Review of Systems   Review of Systems  All other systems reviewed and are negative.   Physical Exam Updated Vital Signs BP (!) 122/91 (BP Location: Right Arm)   Pulse 93   Temp 98.1 F (36.7 C) (Oral)   Resp 16   Ht 1.702 m (5\' 7" )   Wt 93 kg   LMP 02/14/2015 (Approximate)   SpO2 97%   BMI 32.11 kg/m   Physical Exam Vitals and nursing note reviewed.  Constitutional:      General: She is not in acute distress.    Appearance: She is well-developed.  HENT:     Head: Normocephalic and atraumatic.     Right Ear: Tympanic membrane and external ear normal.     Left Ear: Tympanic membrane and external ear normal.  Eyes:     General: No scleral icterus.       Right eye: No discharge.        Left eye: No discharge.     Conjunctiva/sclera: Conjunctivae normal.  Neck:     Trachea: No tracheal deviation.  Cardiovascular:     Rate and Rhythm: Normal  rate and regular rhythm.  Pulmonary:     Effort: Pulmonary effort is normal. No respiratory distress.     Breath sounds: Normal breath sounds. No stridor. No wheezing or rales.  Abdominal:     General: Bowel sounds are normal.  There is no distension.     Palpations: Abdomen is soft.     Tenderness: There is no abdominal tenderness. There is no guarding or rebound.  Musculoskeletal:        General: No tenderness.     Cervical back: Neck supple.  Skin:    General: Skin is warm and dry.     Findings: No rash.  Neurological:     Mental Status: She is alert and oriented to person, place, and time.     Cranial Nerves: No cranial nerve deficit (No facial droop, extraocular movements intact, tongue midline ).     Sensory: No sensory deficit.     Motor: No abnormal muscle tone or seizure activity.     Coordination: Coordination normal.     Comments: No pronator drift bilateral upper extrem, able to hold both legs off bed for 5 seconds, sensation intact in all extremities, no visual field cuts, no left or right sided neglect, normal finger-nose exam bilaterally, no nystagmus noted      ED Results / Procedures / Treatments   Labs (all labs ordered are listed, but only abnormal results are displayed) Labs Reviewed  CBC - Abnormal; Notable for the following components:      Result Value   RBC 5.95 (*)    HCT 46.5 (*)    MCV 78.2 (*)    MCH 24.9 (*)    All other components within normal limits  BASIC METABOLIC PANEL - Abnormal; Notable for the following components:   Glucose, Bld 106 (*)    All other components within normal limits    EKG None  Radiology No results found.  Procedures Procedures (including critical care time)  Medications Ordered in ED Medications  meclizine (ANTIVERT) tablet 25 mg (has no administration in time range)    ED Course  I have reviewed the triage vital signs and the nursing notes.  Pertinent labs & imaging results that were available during my  care of the patient were reviewed by me and considered in my medical decision making (see chart for details).  Clinical Course as of May 17 1032  Thu May 18, 2019  1007 Labs reviewed.  No significant abnormalities.   [JK]    Clinical Course User Index [JK] Dorie Rank, MD   MDM Rules/Calculators/A&P               NIH Stroke Scale: 0       Patient presented to the emergency room for evaluation of dizziness.  Patient was recently tested positive for COVID-19.   Fortunately she does not seem to be having any severe complications associated with that.  She is breathing easily and her oxygenation is normal.  Patient is having symptoms suggestive of peripheral vertigo.  No focal neurologic symptoms to suggest stroke.  NIH stroke scale is 0.  Discharge home with symptomatic treatment.  Discussed Epley maneuvers. Final Clinical Impression(s) / ED Diagnoses Final diagnoses:  Vertigo  COVID-19 virus infection    Rx / DC Orders ED Discharge Orders         Ordered    meclizine (ANTIVERT) 25 MG tablet  3 times daily PRN     05/18/19 1031    ondansetron (ZOFRAN ODT) 8 MG disintegrating tablet  Every 8 hours PRN     05/18/19 1031           Dorie Rank, MD 05/18/19 1035

## 2019-05-18 NOTE — Discharge Instructions (Signed)
Take the medications as needed to help with the dizziness.  Zofran will help for nausea.  Try doing the Epley maneuvers to see if that helps with the dizziness.  Follow-up with a primary care doctor to make sure you are improving.  Return as needed for worsening symptoms.

## 2019-05-18 NOTE — ED Notes (Signed)
ED Provider at bedside. 

## 2019-05-26 ENCOUNTER — Telehealth: Payer: Self-pay | Admitting: Family Medicine

## 2019-05-26 DIAGNOSIS — J40 Bronchitis, not specified as acute or chronic: Secondary | ICD-10-CM

## 2019-05-26 MED ORDER — BENZONATATE 100 MG PO CAPS: 200.0000 mg | ORAL_CAPSULE | Freq: Three times a day (TID) | ORAL | 0 refills | Status: DC | PRN

## 2019-05-26 NOTE — Telephone Encounter (Signed)
Pt seen on 05/12/2019 for Bronchitis and tested positive COVID. Pt seen in the ED on 05/18/19 for dizziness and COVID. Please advise

## 2019-05-26 NOTE — Telephone Encounter (Signed)
Pt states she is doing much better and states she has a lingering cough. I advised patient if the cough got worse or didn't go away we could do a vv or have her seen at the resp clinic

## 2019-05-26 NOTE — Telephone Encounter (Signed)
Ok to refills See if she needs vv or resp clinic please

## 2019-05-26 NOTE — Telephone Encounter (Signed)
Medication:benzonatate (TESSALON PERLES) 100 MG capsule  Has the patient contacted their pharmacy? No. (If no, request that the patient contact the pharmacy for the refill.) (If yes, when and what did the pharmacy advise?)  Preferred Pharmacy (with phone number or street name): [facility] [address]    Agent: Please be advised that RX refills may take up to 3 business days. We ask that you follow-up with your pharmacy.

## 2019-06-05 ENCOUNTER — Ambulatory Visit (INDEPENDENT_AMBULATORY_CARE_PROVIDER_SITE_OTHER): Payer: No Typology Code available for payment source | Admitting: Family Medicine

## 2019-06-05 ENCOUNTER — Other Ambulatory Visit: Payer: Self-pay

## 2019-06-05 ENCOUNTER — Encounter: Payer: Self-pay | Admitting: Family Medicine

## 2019-06-05 VITALS — Temp 97.4°F | Ht 67.0 in

## 2019-06-05 DIAGNOSIS — R05 Cough: Secondary | ICD-10-CM | POA: Diagnosis not present

## 2019-06-05 DIAGNOSIS — R058 Other specified cough: Secondary | ICD-10-CM

## 2019-06-05 MED ORDER — PREDNISONE 10 MG PO TABS: ORAL_TABLET | ORAL | 0 refills | Status: DC

## 2019-06-05 NOTE — Progress Notes (Deleted)
Virtual Visit via Video Note  I connected with Jo Compton on 06/05/19 at 11:20 AM EST by a video enabled telemedicine application and verified that I am speaking with the correct person using two identifiers.  Location: Patient: *** Provider: ***   I discussed the limitations of evaluation and management by telemedicine and the availability of in person appointments. The patient expressed understanding and agreed to proceed.  History of Present Illness:    Observations/Objective:   Assessment and Plan:   Follow Up Instructions:    I discussed the assessment and treatment plan with the patient. The patient was provided an opportunity to ask questions and all were answered. The patient agreed with the plan and demonstrated an understanding of the instructions.   The patient was advised to call back or seek an in-person evaluation if the symptoms worsen or if the condition fails to improve as anticipated.  I provided *** minutes of non-face-to-face time during this encounter.   [name], DO Patient ID: Jo Compton, female    DOB: 18-Sep-1965  Age: 54 y.o. MRN: [id number]    Subjective:  Subjective  HPI [name] presents for ***  Review of Systems  History Past Medical History:  Diagnosis Date  . Anal fissure   . Anemia   . Depression   . Fistula    rectal  . OAB (overactive bladder)   . Rectal bleeding     She has a past surgical history that includes Appendectomy; Colonoscopy; Abdominal hysterectomy (Bilateral, 03/20/2015); laparoscopy (N/A, 03/20/2015); Bilateral salpingectomy (Bilateral, 03/20/2015); and Cystoscopy (N/A, 03/20/2015).   Her family history includes COPD in her mother; Colon cancer in her maternal uncle; Diabetes in her maternal aunt; Hypertension in her maternal aunt and mother; Liver cancer in her maternal uncle; Lung cancer in her mother.She reports that she has never smoked. She has never used smokeless tobacco. She reports  current alcohol use of about 1.0 standard drinks of alcohol per week. She reports that she does not use drugs.  Current Outpatient Medications on File Prior to Visit  Medication Sig Dispense Refill  . azelastine (ASTELIN) 0.1 % nasal spray Place 2 sprays into both nostrils 2 (two) times daily. Use in each nostril as directed 30 mL 1  . benzonatate (TESSALON PERLES) 100 MG capsule Take 2 capsules (200 mg total) by mouth 3 (three) times daily as needed for cough. 20 capsule 0  . dilTIAZem HCl POWD APPLY RECTALLY TWICE DAILY. 30 g 2  . docusate sodium (COLACE) 100 MG capsule Take 1 capsule (100 mg total) by mouth 2 (two) times daily. 120 capsule 2  . fluticasone (FLONASE) 50 MCG/ACT nasal spray Place 2 sprays into both nostrils daily. 16 g 6  . hydrochlorothiazide (HYDRODIURIL) 25 MG tablet Take 1 tablet (25 mg total) by mouth daily. 90 tablet 3  . ibuprofen (ADVIL) 800 MG tablet Take 1 tablet (800 mg total) by mouth every 8 (eight) hours as needed. 30 tablet 0  . meclizine (ANTIVERT) 25 MG tablet Take 1 tablet (25 mg total) by mouth 3 (three) times daily as needed for dizziness. 21 tablet 0  . ondansetron (ZOFRAN ODT) 8 MG disintegrating tablet Take 1 tablet (8 mg total) by mouth every 8 (eight) hours as needed for nausea or vomiting. 12 tablet 0   No current facility-administered medications on file prior to visit.     Objective:  Objective  Physical Exam Temp (!) 97.4 F (36.3 C)   Ht 5'  7" (1.702 m)   LMP 02/14/2015 (Approximate)   BMI 32.11 kg/m  Wt Readings from Last 3 Encounters:  05/18/19 205 lb (93 kg)  03/14/19 207 lb (93.9 kg)  03/09/19 207 lb (93.9 kg)     Lab Results  Component Value Date   WBC 6.8 05/18/2019   HGB 14.8 05/18/2019   HCT 46.5 (H) 05/18/2019   PLT 253 05/18/2019   GLUCOSE 106 (H) 05/18/2019   CHOL 160 06/07/2012   TRIG 101.0 06/07/2012   HDL 49.10 06/07/2012   LDLCALC 91 06/07/2012   ALT 25 12/06/2018   AST 22 12/06/2018   NA 138 05/18/2019   K  3.6 05/18/2019   CL 102 05/18/2019   CREATININE 0.87 05/18/2019   BUN 12 05/18/2019   CO2 26 05/18/2019   TSH 0.54 12/06/2018   HGBA1C 6.2 12/06/2018   MICROALBUR 0.1 06/07/2012    No results found.   Assessment & Plan:  Plan  I have discontinued Amado Nash. Lukehart's azithromycin. I am also having her maintain her docusate sodium, hydrochlorothiazide, ibuprofen, dilTIAZem HCl, fluticasone, azelastine, meclizine, ondansetron, and benzonatate.  No orders of the defined types were placed in this encounter.   Problem List Items Addressed This Visit    None      Follow-up: No follow-ups on file.  [name], DO

## 2019-06-05 NOTE — Progress Notes (Signed)
Virtual Visit via Video Note  I connected with Jo Compton on 06/05/19 at 11:20 AM EST by a video enabled telemedicine application and verified that I am speaking with the correct person using two identifiers.  Location: Patient: home  Provider: office    I discussed the limitations of evaluation and management by telemedicine and the availability of in person appointments. The patient expressed understanding and agreed to proceed.  History of Present Illness: Pt is home c/o dry cough that con't since having covid.  No fever   No congestion  Cough is keeping her awake    Observations/Objective: Vitals:   06/05/19 1121  Temp: (!) 97.4 F (36.3 C)     Pt is in NAD Assessment and Plan: 1. Dry cough pred taper and cough med resp clinic if no better  - predniSONE (DELTASONE) 10 MG tablet; TAKE 3 TABLETS PO QD FOR 3 DAYS THEN TAKE 2 TABLETS PO QD FOR 3 DAYS THEN TAKE 1 TABLET PO QD FOR 3 DAYS THEN TAKE 1/2 TAB PO QD FOR 3 DAYS  Dispense: 20 tablet; Refill: 0   Follow Up Instructions:    I discussed the assessment and treatment plan with the patient. The patient was provided an opportunity to ask questions and all were answered. The patient agreed with the plan and demonstrated an understanding of the instructions.   The patient was advised to call back or seek an in-person evaluation if the symptoms worsen or if the condition fails to improve as anticipated.  I provided 20 minutes of non-face-to-face time during this encounter.   [name], DO

## 2019-06-14 ENCOUNTER — Ambulatory Visit (INDEPENDENT_AMBULATORY_CARE_PROVIDER_SITE_OTHER): Payer: No Typology Code available for payment source | Admitting: Family Medicine

## 2019-06-14 VITALS — BP 120/90 | HR 72 | Temp 98.4°F | Ht 67.0 in | Wt 211.0 lb

## 2019-06-14 DIAGNOSIS — J0141 Acute recurrent pansinusitis: Secondary | ICD-10-CM

## 2019-06-14 DIAGNOSIS — R059 Cough, unspecified: Secondary | ICD-10-CM

## 2019-06-14 DIAGNOSIS — R05 Cough: Secondary | ICD-10-CM | POA: Diagnosis not present

## 2019-06-14 MED ORDER — CEFDINIR 300 MG PO CAPS: 300.0000 mg | ORAL_CAPSULE | Freq: Two times a day (BID) | ORAL | 0 refills | Status: DC

## 2019-06-14 NOTE — Patient Instructions (Signed)

## 2019-06-14 NOTE — Progress Notes (Signed)
Patient ID: Jo Compton, female    DOB: [birth year], 54 y.o.   MRN: [id number]  PCP: [name], DO  Chief Complaint  Patient presents with  . Sinus Problem  . Cough-COVID 19 Positive in January    Subjective:  HPI Jo Compton is a 54 y.o. female presents to [facility] for evaluation of upper respiratory symptoms and cough persisting post COVID-19 diagnosis in around 05/18/19.   Patient presents with symptoms of persistent cough since covid infection. She endorses sensation of mucus constantly in her throat and and sensation that constantly needs to clear her throat. She endorses nasal congestion, facial pressure, sensation of post nasal drainage. No history of recurrent sinusitis.  She had been treated with Azithromycin and currently completed a prednisone taper (prescribed on 2/8), and Flonase. She coughs persistently and cough worsens with laying down and upon awakening in the morning .   Review of Systems Pertinent negatives listed in HPI  Patient Active Problem List   Diagnosis Date Noted  . Multinodular goiter 03/09/2019  . Leg cramps 11/25/2018  . Claudication ([place]) 11/10/2018  . Wound infection 01/08/2018  . SOB (shortness of breath) 04/15/2017  . 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 Edema, lower extremity 11/08/2016  . Menorrhagia 03/20/2015  . Depression 12/05/2014  . Anemia, iron deficiency 08/03/2013  . Anal fissure 01/17/2013  . Hemorrhage of rectum and anus 01/17/2013  . Obesity (BMI 30-39.9) 12/17/2012  . HTN (hypertension) 08/10/2012      Prior to Admission medications   Medication Sig Start Date End Date Taking? Authorizing Provider  azelastine (ASTELIN) 0.1 % nasal spray Place 2 sprays into both nostrils 2 (two) times daily. Use in each nostril as directed 05/17/19  Yes Lowne Chase, Yvonne R, DO  dilTIAZem HCl POWD APPLY RECTALLY TWICE DAILY. 01/11/19  Yes Roma Schanz R, DO  docusate sodium (COLACE) 100 MG  capsule Take 1 capsule (100 mg total) by mouth 2 (two) times daily. 03/22/15  Yes Ena Dawley, MD  fluticasone [facility]) 50 MCG/ACT nasal spray Place 2 sprays into both nostrils daily. 05/12/19  Yes Roma Schanz R, DO  hydrochlorothiazide (HYDRODIURIL) 25 MG tablet Take 1 tablet (25 mg total) by mouth daily. 11/10/18  Yes Roma Schanz R, DO  ibuprofen (ADVIL) 800 MG tablet Take 1 tablet (800 mg total) by mouth every 8 (eight) hours as needed. 11/25/18  Yes [name], DO  meclizine (ANTIVERT) 25 MG tablet Take 1 tablet (25 mg total) by mouth 3 (three) times daily as needed for dizziness. 05/18/19  Yes Dorie Rank, MD  ondansetron (ZOFRAN ODT) 8 MG disintegrating tablet Take 1 tablet (8 mg total) by mouth every 8 (eight) hours as needed for nausea or vomiting. 05/18/19  Yes Dorie Rank, MD  predniSONE (DELTASONE) 10 MG tablet TAKE 3 TABLETS PO QD FOR 3 DAYS THEN TAKE 2 TABLETS PO QD FOR 3 DAYS THEN TAKE 1 TABLET PO QD FOR 3 DAYS THEN TAKE 1/2 TAB PO QD FOR 3 DAYS 06/05/19  Yes [name], DO    Past Medical, Surgical Family and Social History reviewed and updated.    Objective:   Today's Vitals   06/14/19 1730  BP: 120/90  Pulse: 72  Temp: 98.4 F (36.9 C)  TempSrc: Oral  SpO2: 98%  Weight: 211 lb (95.7 kg)  Height: 5\' 7"  (1.702 m)    Wt Readings from Last 3 Encounters:  06/14/19 211 lb (  95.7 kg)  05/18/19 205 lb (93 kg)  03/14/19 207 lb (93.9 kg)   Physical Exam HENT:     Nose: Mucosal edema and congestion present.     Right Turbinates: Swollen.     Left Turbinates: Swollen.     Right Sinus: Maxillary sinus tenderness and frontal sinus tenderness present.     Left Sinus: Maxillary sinus tenderness and frontal sinus tenderness present.  Cardiovascular:     Rate and Rhythm: Normal rate and regular rhythm.  Pulmonary:     Effort: Pulmonary effort is normal.     Breath sounds: Normal breath sounds and air entry.  Skin:    General: Skin is warm  and dry.  Neurological:     Mental Status: She is oriented to person, place, and time.  Psychiatric:        Attention and Perception: Attention normal.        Mood and Affect: Mood normal.          Assessment & Plan:  1. Acute recurrent pansinusitis -Start Omnicef 300 mg two times daily x 10 days  -Continue Flonase daily  2. Cough, secondary PND -Treating what I suspect is the underlying cause of cough, sinusitis.  Patient advised to follow-up if no improvement with treatment of sinusitis    Meds ordered this encounter  Medications  . cefdinir (OMNICEF) 300 MG capsule    Sig: Take 1 capsule (300 mg total) by mouth 2 (two) times daily.    Dispense:  20 capsule    Refill:  0     -The patient was given clear instructions to go to ER or return to medical center if symptoms do not improve, worsen or new problems develop. The patient verbalized understanding.     Molli Barrows, FNP-C [facility], PRN Provider  [facility]. [place], [facility] Phone: [phone] Clinic Fax: [phone] Clinic Hours: 5:30 pm -7:30 pm (Monday-Friday)

## 2019-06-28 ENCOUNTER — Encounter: Payer: Self-pay | Admitting: Family Medicine

## 2019-06-28 ENCOUNTER — Ambulatory Visit (INDEPENDENT_AMBULATORY_CARE_PROVIDER_SITE_OTHER): Payer: No Typology Code available for payment source | Admitting: Family Medicine

## 2019-06-28 ENCOUNTER — Other Ambulatory Visit: Payer: Self-pay

## 2019-06-28 DIAGNOSIS — J0140 Acute pansinusitis, unspecified: Secondary | ICD-10-CM | POA: Diagnosis not present

## 2019-06-28 MED ORDER — CLARITHROMYCIN ER 500 MG PO TB24: 1000.0000 mg | ORAL_TABLET | Freq: Every day | ORAL | 0 refills | Status: DC

## 2019-06-28 NOTE — Progress Notes (Signed)
Virtual Visit via Video Note  I connected with Adolphus Birchwood on 06/28/19 at  8:40 AM EST by a video enabled telemedicine application and verified that I am speaking with the correct person using two identifiers.  Location: Patient: home alone  Provider: home   I discussed the limitations of evaluation and management by telemedicine and the availability of in person appointments. The patient expressed understanding and agreed to proceed.  History of Present Illness: Pt is home c/o con't sinus pressure and drainage.  She was seen in the resp clinic and put on omnicef for a sinus infection -- prior to that she was on pred taper for cough due to covid   She is feeling better better but since has drainage and pressure.  No fever   Past Medical History:  Diagnosis Date  . Anal fissure   . Anemia   . Depression   . Fistula    rectal  . OAB (overactive bladder)   . Rectal bleeding    Current Outpatient Medications on File Prior to Visit  Medication Sig Dispense Refill  . azelastine (ASTELIN) 0.1 % nasal spray Place 2 sprays into both nostrils 2 (two) times daily. Use in each nostril as directed 30 mL 1  . cefdinir (OMNICEF) 300 MG capsule Take 1 capsule (300 mg total) by mouth 2 (two) times daily. 20 capsule 0  . dilTIAZem HCl POWD APPLY RECTALLY TWICE DAILY. 30 g 2  . docusate sodium (COLACE) 100 MG capsule Take 1 capsule (100 mg total) by mouth 2 (two) times daily. 120 capsule 2  . fluticasone (FLONASE) 50 MCG/ACT nasal spray Place 2 sprays into both nostrils daily. 16 g 6  . hydrochlorothiazide (HYDRODIURIL) 25 MG tablet Take 1 tablet (25 mg total) by mouth daily. 90 tablet 3  . ibuprofen (ADVIL) 800 MG tablet Take 1 tablet (800 mg total) by mouth every 8 (eight) hours as needed. 30 tablet 0  . meclizine (ANTIVERT) 25 MG tablet Take 1 tablet (25 mg total) by mouth 3 (three) times daily as needed for dizziness. 21 tablet 0  . ondansetron (ZOFRAN ODT) 8 MG disintegrating tablet Take 1  tablet (8 mg total) by mouth every 8 (eight) hours as needed for nausea or vomiting. 12 tablet 0  . predniSONE (DELTASONE) 10 MG tablet TAKE 3 TABLETS PO QD FOR 3 DAYS THEN TAKE 2 TABLETS PO QD FOR 3 DAYS THEN TAKE 1 TABLET PO QD FOR 3 DAYS THEN TAKE 1/2 TAB PO QD FOR 3 DAYS (Patient not taking: Reported on 06/28/2019) 20 tablet 0   No current facility-administered medications on file prior to visit.   No Known Allergies Social History   Socioeconomic History  . Marital status: Divorced    Spouse name: Not on file  . Number of children: 1  . Years of education: Not on file  . Highest education level: Not on file  Occupational History  . Occupation: [employer] Use  . Smoking status: Never Smoker  . Smokeless tobacco: Never Used  Substance and Sexual Activity  . Alcohol use: Yes    Alcohol/week: 1.0 standard drinks    Types: 1 Glasses of wine per week  . Drug use: No  . Sexual activity: Yes    Birth control/protection: None  Other Topics Concern  . Not on file  Social History Narrative   ** Merged History Encounter **       Social Determinants of Radio broadcast assistant  Strain:   . Difficulty of Paying Living Expenses: Not on file  Food Insecurity:   . Worried About Charity fundraiser in the Last Year: Not on file  . Ran Out of Food in the Last Year: Not on file  Transportation Needs:   . Lack of Transportation (Medical): Not on file  . Lack of Transportation (Non-Medical): Not on file  Physical Activity:   . Days of Exercise per Week: Not on file  . Minutes of Exercise per Session: Not on file  Stress:   . Feeling of Stress : Not on file  Social Connections:   . Frequency of Communication with Friends and Family: Not on file  . Frequency of Social Gatherings with Friends and Family: Not on file  . Attends Religious Services: Not on file  . Active Member of Clubs or Organizations: Not on file  . Attends Archivist  Meetings: Not on file  . Marital Status: Not on file  Intimate Partner Violence:   . Fear of Current or Ex-Partner: Not on file  . Emotionally Abused: Not on file  . Physically Abused: Not on file  . Sexually Abused: Not on file   Observations/Objective: No vitals obtained  Pt is in nad   Assessment and Plan: 1. Acute non-recurrent pansinusitis biaxin for 7 days  con't flonase and claritin F/u prn  - clarithromycin (BIAXIN XL) 500 MG 24 hr tablet; Take 2 tablets (1,000 mg total) by mouth daily.  Dispense: 14 tablet; Refill: 0   Follow Up Instructions:    I discussed the assessment and treatment plan with the patient. The patient was provided an opportunity to ask questions and all were answered. The patient agreed with the plan and demonstrated an understanding of the instructions.   The patient was advised to call back or seek an in-person evaluation if the symptoms worsen or if the condition fails to improve as anticipated.  I provided 25 minutes of non-face-to-face time during this encounter.-- prior ov notes and pmh etc reviewed as well   [name], DO

## 2019-07-10 ENCOUNTER — Encounter: Payer: Self-pay | Admitting: Internal Medicine

## 2019-07-19 ENCOUNTER — Encounter: Payer: Self-pay | Admitting: Family Medicine

## 2019-07-19 ENCOUNTER — Other Ambulatory Visit: Payer: Self-pay

## 2019-07-19 ENCOUNTER — Ambulatory Visit (INDEPENDENT_AMBULATORY_CARE_PROVIDER_SITE_OTHER): Payer: No Typology Code available for payment source | Admitting: Family Medicine

## 2019-07-19 DIAGNOSIS — G5793 Unspecified mononeuropathy of bilateral lower limbs: Secondary | ICD-10-CM | POA: Diagnosis not present

## 2019-07-19 DIAGNOSIS — M5136 Other intervertebral disc degeneration, lumbar region: Secondary | ICD-10-CM

## 2019-07-19 MED ORDER — GABAPENTIN 100 MG PO CAPS: 100.0000 mg | ORAL_CAPSULE | Freq: Three times a day (TID) | ORAL | 3 refills | Status: DC

## 2019-07-19 NOTE — Progress Notes (Signed)
Virtual Visit via Video Note  I connected with Jo Compton on 07/19/19 at 10:20 AM EDT by a video enabled telemedicine application and verified that I am speaking with the correct person using two identifiers.  Location: Patient: in car Provider: home    I discussed the limitations of evaluation and management by telemedicine and the availability of in person appointments. The patient expressed understanding and agreed to proceed.  History of Present Illness: Pt c/o burning in feet and legs to buttocks-- records reviewed she has been seen several times for similar and was referred to ortho but never went ---- xray showed ddd and slipping ----- pt states burning has worsened    Observations/Objective: No vitals obtained  Pt is in nad  DG Lumbar Spine 2-3 Views (Accession [id number]) (Order [id number]) Imaging Date: 06/16/2016 Department: [facility] Released By: Max Sane A Authorizing: [name], DO  Exam Status  Status  Final [99]  PACS Intelerad Image Link  Show images for DG Lumbar Spine 2-3 Views  Study Result  CLINICAL DATA:  54 year old female with right leg numbness and tingling for few months. Initial encounter.  EXAM: LUMBAR SPINE - 2-3 VIEW  COMPARISON:  03/17/2015 CT.  FINDINGS: Minimal anterior slip L4 felt be related to mild facet degenerative changes.  Minimal L5-S1 disc space narrowing.  Prominent phleboliths within the pelvis.  IMPRESSION: Minimal anterior slip L4 felt be related to mild facet degenerative changes.  Minimal L5-S1 disc space narrowing.   Electronically Signed   By: Genia Del M.D.   On: 06/16/2016 18:00   DG Lumbar Spine 2-3 Views: Result Notes Older Notes  Notes Recorded by Doylene Canning, CMA on 06/17/2016 at 12:56 PM EST Patient notified. ------  Notes Recorded by Marjory Lies, LPN on 075-GRM at 075-GRM AM EST Left message on pt's vm to give  the office a call back, in regards to x ray results. LB ------  Notes Recorded by [name], DO on 06/16/2016 at 9:52 PM EST Disc space narrowing  MRI LS spine no contrast      MyChart Results Release  MyChart Status: Pending Results Release  Encounter-Level Documents - 06/16/2016:  Electronic signature on 06/16/2016 3:35 PM - E-signed Electronic signature on 06/16/2016 3:34 PM - E-signed Scan on 11/17/2016 3:02 PM by Luvenia Heller, Destiny A     Order-Level Documents:  There are no order-level documents. Hospital account-Level Documents:  There are no hospital account-level documents. Vitals  Height Weight BMI (Calculated)  5\' 7"  (1.702 m) 207 lb 6.4 oz (94.1 kg) 32.6  Imaging  Imaging Information  Resulted by:  Signed Date/Time  Phone Pager  Genia Del 06/16/2016 6:00 PM [phone]   DG Lumbar Spine 2-3 Views: Result Notes Older Notes  Notes Recorded by Doylene Canning, CMA on 06/17/2016 at 12:56 PM EST Patient notified. ------  Notes Recorded by Marjory Lies, LPN on 075-GRM at 075-GRM AM EST Left message on pt's vm to give the office a call back, in regards to x ray results. LB ------  Notes Recorded by [name], DO on 06/16/2016 at 9:52 PM EST Disc space narrowing  MRI LS spine no contrast      Study Notes   Janyth Contes on 06/16/2016 3:46 PM  Pt co right leg numbness and tingling x few months    Original Order  Ordered On Ordered By   06/16/2016 3:16 PM Marjory Lies,  LPN       External Result Report  External Result Report    Assessment and Plan: 1. Neuropathic pain, leg, bilateral   - Ambulatory referral to Orthopedic Surgery - gabapentin (NEURONTIN) 100 MG capsule; Take 1 capsule (100 mg total) by mouth 3 (three) times daily.  Dispense: 90 capsule; Refill: 3  2. DDD (degenerative disc disease), lumbar Refer to ortho neurontin work up to 100 mg tid and can slowly inc from there if needed Call prn   - Ambulatory referral to Orthopedic Surgery - gabapentin (NEURONTIN) 100 MG capsule; Take 1 capsule (100 mg total) by mouth 3 (three) times daily.  Dispense: 90 capsule; Refill: 3   Follow Up Instructions:    I discussed the assessment and treatment plan with the patient. The patient was provided an opportunity to ask questions and all were answered. The patient agreed with the plan and demonstrated an understanding of the instructions.   The patient was advised to call back or seek an in-person evaluation if the symptoms worsen or if the condition fails to improve as anticipated.  I provided 25 minutes of non-face-to-face time during this encounter.   [name], DO

## 2019-08-02 ENCOUNTER — Ambulatory Visit: Payer: No Typology Code available for payment source | Admitting: Orthopedic Surgery

## 2019-08-02 ENCOUNTER — Other Ambulatory Visit: Payer: Self-pay

## 2019-08-02 ENCOUNTER — Ambulatory Visit: Payer: Self-pay

## 2019-08-02 ENCOUNTER — Encounter: Payer: Self-pay | Admitting: Orthopedic Surgery

## 2019-08-02 ENCOUNTER — Ambulatory Visit (INDEPENDENT_AMBULATORY_CARE_PROVIDER_SITE_OTHER): Payer: No Typology Code available for payment source | Admitting: Orthopedic Surgery

## 2019-08-02 ENCOUNTER — Ambulatory Visit (AMBULATORY_SURGERY_CENTER): Payer: Self-pay | Admitting: *Deleted

## 2019-08-02 VITALS — Temp 97.1°F | Ht 67.0 in | Wt 216.8 lb

## 2019-08-02 DIAGNOSIS — M5441 Lumbago with sciatica, right side: Secondary | ICD-10-CM

## 2019-08-02 DIAGNOSIS — M79604 Pain in right leg: Secondary | ICD-10-CM | POA: Diagnosis not present

## 2019-08-02 DIAGNOSIS — K625 Hemorrhage of anus and rectum: Secondary | ICD-10-CM

## 2019-08-02 MED ORDER — PREDNISONE 10 MG (21) PO TBPK: ORAL_TABLET | ORAL | 0 refills | Status: DC

## 2019-08-02 NOTE — Progress Notes (Signed)
Pt had second dose of covid vaccine greater than 2 weeks ago  Pt states she doesn't use the bathroom regularly- only goes 1-2 times a week.  Put on 2 day Miralax/Miralax  Pt is aware that care partner will wait in the car during procedure; if they feel like they will be too hot or cold to wait in the car; they may wait in the 4 th floor lobby. Patient is aware to bring only one care partner. We want them to wear a mask (we do not have any that we can provide them), practice social distancing, and we will check their temperatures when they get here.  I did remind the patient that their care partner needs to stay in the parking lot the entire time and have a cell phone available, we will call them when the pt is ready for discharge. Patient will wear mask into building.   No egg or soy allergy  No home oxygen use   No medications for weight loss taken  emmi information given   No trouble with anesthesia, difficulty with intubation or hx/fam hx of malignant hyperthermia per pt

## 2019-08-04 ENCOUNTER — Encounter: Payer: Self-pay | Admitting: Orthopedic Surgery

## 2019-08-04 NOTE — Progress Notes (Signed)
Office Visit Note   Patient: Jo Compton           Date of Birth: 04-22-1966           MRN: [id number] Visit Date: 08/02/2019 Requested by: [address], [place], [place] [address] [place],  [place] [address] PCP: Carollee Herter, Alferd Apa, DO  Subjective: No chief complaint on file.   HPI: Jo Compton is a 54 y.o. female who presents to the office complaining of back pain.  Patient notes that she has had several years of low back pain with radiation down her right leg.  She denies any injury leading to onset of pain.  This is been worsening over the last several years.  She notes pain that travels from her back down her right leg to the bottom of her right foot.  This pain wakes her up at night on occasion.  She also has associated numbness/tingling of the right leg.  Her symptoms are worse with prolonged walking or standing.  She notes "burning in my feet" as well as subjective weakness in her legs that is worse in the morning.  She denies any groin pain, trochanteric pain, red flag symptoms such as incontinence, or saddle anesthesia.  She has tried taking ibuprofen with some relief.  She has never had an MRI of her lumbar spine or ESI's or back surgery..                ROS:  All systems reviewed are negative as they relate to the chief complaint within the history of present illness.  Patient denies fevers or chills.  Assessment & Plan: Visit Diagnoses:  1. Low back pain with right-sided sciatica, unspecified back pain laterality, unspecified chronicity   2. Pain in right leg     Plan: Patient is a 54 year old female who presents complaining of years of low back pain with right leg radicular symptoms.  She does note some subjective weakness.  She has no history of back surgery.  On exam she has a positive straight leg raise on the right side.  She is hyperreflexic and has tenderness to palpation throughout the right paraspinal musculature.  Ordered MRI of the lumbar spine  for further evaluation.  Also prescribed a steroid Dosepak for pain relief.  She will follow-up after MRI to review results.  Radiographs of the lumbar spine taken today reveal some degenerative changes of the facet joints with no significant degenerative changes of the disc spaces.  She does have loss of lordosis of the lumbar spine.  No fractures identified.  Follow-Up Instructions: No follow-ups on file.   Orders:  Orders Placed This Encounter  Procedures  . XR Lumbar Spine 2-3 Views  . MR Lumbar Spine w/o contrast   Meds ordered this encounter  Medications  . predniSONE (STERAPRED UNI-PAK 21 TAB) 10 MG (21) TBPK tablet    Sig: Take as directed on package    Dispense:  21 tablet    Refill:  0      Procedures: No procedures performed   Clinical Data: No additional findings.  Objective: Vital Signs: LMP 02/14/2015 (Approximate)   Physical Exam:  Constitutional: Patient appears well-developed HEENT:  Head: Normocephalic Eyes:EOM are normal Neck: Normal range of motion Cardiovascular: Normal rate Pulmonary/chest: Effort normal Neurologic: Patient is alert Skin: Skin is warm Psychiatric: Patient has normal mood and affect  Ortho Exam:  No significant tenderness to palpation through the axial lumbar spine.  Tender  to palpation through the right paraspinal musculature.  Hyporeflexic quadricep/Achilles reflexes bilaterally.  Positive straight leg raise of the right lower extremity.  Sensation intact through all dermatomes of the bilateral lower extremities.  5/5 motor strength of the bilateral hip flexors, quadriceps, hamstring, dorsiflexion, plantarflexion.  Specialty Comments:  No specialty comments available.  Imaging: No results found.   PMFS History: Patient Active Problem List   Diagnosis Date Noted  . Multinodular goiter 03/09/2019  . Leg cramps 11/25/2018  . Claudication ([place]) 11/10/2018  . Wound infection 01/08/2018  . SOB (shortness of breath) 04/15/2017   . 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 Edema, lower extremity 11/08/2016  . Menorrhagia 03/20/2015  . Depression 12/05/2014  . Anemia, iron deficiency 08/03/2013  . Anal fissure 01/17/2013  . Hemorrhage of rectum and anus 01/17/2013  . Obesity (BMI 30-39.9) 12/17/2012  . HTN (hypertension) 08/10/2012   Past Medical History:  Diagnosis Date  . Anal fissure   . Anemia   . Anxiety    no meds taken  . Arthritis   . Depression   . Fistula    rectal  . OAB (overactive bladder)   . Rectal bleeding     Family History  Problem Relation Age of Onset  . Hypertension Mother   . COPD Mother   . Lung cancer Mother   . Colon cancer Maternal Uncle   . Liver cancer Maternal Uncle   . Hypertension Maternal Aunt   . Diabetes Maternal Aunt   . Esophageal cancer Neg Hx   . Rectal cancer Neg Hx   . Stomach cancer Neg Hx     Past Surgical History:  Procedure Laterality Date  . ABDOMINAL HYSTERECTOMY Bilateral 03/20/2015   Procedure: HYSTERECTOMY ABDOMINAL, EXCISION MESENTERIC NODULE, BLADDER FLAP BIOPSY;  Surgeon: Ena Dawley, MD;  Location: [facility];  Service: Gynecology;  Laterality: Bilateral;  . APPENDECTOMY    . BILATERAL SALPINGECTOMY Bilateral 03/20/2015   Procedure: BILATERAL SALPINGECTOMY;  Surgeon: Ena Dawley, MD;  Location: [facility];  Service: Gynecology;  Laterality: Bilateral;  . COLONOSCOPY    . CYSTOSCOPY N/A 03/20/2015   Procedure: CYSTOSCOPY;  Surgeon: Ena Dawley, MD;  Location: [facility];  Service: Gynecology;  Laterality: N/A;  . LAPAROSCOPY N/A 03/20/2015   Procedure: LAPAROSCOPY DIAGNOSTIC;  Surgeon: Ena Dawley, MD;  Location: [facility];  Service: Gynecology;  Laterality: N/A;   Social History   Occupational History  . Occupation: [employer] at [employer] Use  . Smoking status: Never Smoker  . Smokeless tobacco: Never Used  Substance and Sexual Activity  . Alcohol use: Yes    Alcohol/week: 1.0 standard drinks     Types: 1 Glasses of wine per week  . Drug use: No  . Sexual activity: Yes    Birth control/protection: None

## 2019-08-13 ENCOUNTER — Other Ambulatory Visit: Payer: Self-pay

## 2019-08-13 ENCOUNTER — Ambulatory Visit (HOSPITAL_BASED_OUTPATIENT_CLINIC_OR_DEPARTMENT_OTHER)
Admission: RE | Admit: 2019-08-13 | Discharge: 2019-08-13 | Disposition: A | Discharge status: Home / Self Care | Payer: PRIVATE HEALTH INSURANCE | Source: Ambulatory Visit | Attending: Orthopedic Surgery | Admitting: Orthopedic Surgery

## 2019-08-13 DIAGNOSIS — M79604 Pain in right leg: Secondary | ICD-10-CM

## 2019-08-15 ENCOUNTER — Encounter: Payer: Self-pay | Admitting: Internal Medicine

## 2019-08-15 ENCOUNTER — Ambulatory Visit (AMBULATORY_SURGERY_CENTER): Payer: No Typology Code available for payment source | Admitting: Internal Medicine

## 2019-08-15 ENCOUNTER — Other Ambulatory Visit: Payer: Self-pay

## 2019-08-15 VITALS — BP 108/74 | HR 80 | Temp 97.7°F | Resp 12 | Ht 67.0 in | Wt 216.0 lb

## 2019-08-15 DIAGNOSIS — K5289 Other specified noninfective gastroenteritis and colitis: Secondary | ICD-10-CM | POA: Diagnosis not present

## 2019-08-15 DIAGNOSIS — D124 Benign neoplasm of descending colon: Secondary | ICD-10-CM | POA: Diagnosis not present

## 2019-08-15 DIAGNOSIS — D123 Benign neoplasm of transverse colon: Secondary | ICD-10-CM | POA: Diagnosis not present

## 2019-08-15 DIAGNOSIS — D120 Benign neoplasm of cecum: Secondary | ICD-10-CM | POA: Diagnosis not present

## 2019-08-15 DIAGNOSIS — K625 Hemorrhage of anus and rectum: Secondary | ICD-10-CM | POA: Diagnosis present

## 2019-08-15 MED ORDER — SODIUM CHLORIDE 0.9 % IV SOLN: 500.0000 mL | Freq: Once | INTRAVENOUS | Status: DC

## 2019-08-15 NOTE — Progress Notes (Signed)
Temp by Jo Compton by KA  Pt's states no medical or surgical changes since previsit or office visit.

## 2019-08-15 NOTE — Patient Instructions (Addendum)
I found and removed 4 tiny polyps that look benign. There was a suggestion of inflammation in the rectum - ? From prep or a condition that you have. I took biopsies and will contact you.   I appreciate the opportunity to care for you. Gatha Mayer, MD, [facility]  Please read all of the handouts given to you by your recovery room nurse.  Try to use metamucil, gummy fibers or benefiber  Every day.  YOU HAD AN ENDOSCOPIC PROCEDURE TODAY AT [facility]:   Refer to the procedure report that was given to you for any specific questions about what was found during the examination.  If the procedure report does not answer your questions, please call your gastroenterologist to clarify.  If you requested that your care partner not be given the details of your procedure findings, then the procedure report has been included in a sealed envelope for you to review at your convenience later.  YOU SHOULD EXPECT: Some feelings of bloating in the abdomen. Passage of more gas than usual.  Walking can help get rid of the air that was put into your GI tract during the procedure and reduce the bloating. If you had a lower endoscopy (such as a colonoscopy or flexible sigmoidoscopy) you may notice spotting of blood in your stool or on the toilet paper. If you underwent a bowel prep for your procedure, you may not have a normal bowel movement for a few days.  Please Note:  You might notice some irritation and congestion in your nose or some drainage.  This is from the oxygen used during your procedure.  There is no need for concern and it should clear up in a day or so.  SYMPTOMS TO REPORT IMMEDIATELY:   Following lower endoscopy (colonoscopy or flexible sigmoidoscopy):  Excessive amounts of blood in the stool  Significant tenderness or worsening of abdominal pains  Swelling of the abdomen that is new, acute  Fever of 100F or higher   For urgent or emergent issues, a gastroenterologist can be reached  at any hour by calling [phone]. Do not use MyChart messaging for urgent concerns.    DIET:  We do recommend a small meal at first, but then you may proceed to your regular diet.  Drink plenty of fluids but you should avoid alcoholic beverages for 24 hours.  ACTIVITY:  You should plan to take it easy for the rest of today and you should NOT DRIVE or use heavy machinery until tomorrow (because of the sedation medicines used during the test).    FOLLOW UP: Our staff will call the number listed on your records 48-72 hours following your procedure to check on you and address any questions or concerns that you may have regarding the information given to you following your procedure. If we do not reach you, we will leave a message.  We will attempt to reach you two times.  During this call, we will ask if you have developed any symptoms of COVID 19. If you develop any symptoms (ie: fever, flu-like symptoms, shortness of breath, cough etc.) before then, please call [phone].  If you test positive for Covid 19 in the 2 weeks post procedure, please call and report this information to [place].    If any biopsies were taken you will be contacted by phone or by letter within the next 1-3 weeks.  Please call us at [phone] if you have not heard about the biopsies in 3  weeks.    SIGNATURES/CONFIDENTIALITY: You and/or your care partner have signed paperwork which will be entered into your electronic medical record.  These signatures attest to the fact that that the information above on your After Visit Summary has been reviewed and is understood.  Full responsibility of the confidentiality of this discharge information lies with you and/or your care-partner.

## 2019-08-15 NOTE — Op Note (Signed)
[facility] Patient Name: Jo Compton Procedure Date: 08/15/2019 8:30 AM MRN: [id number] Endoscopist: Gatha Mayer , MD Age: 54 Referring MD:  Date of Birth: May 13, 1965 Gender: Female Account #: [id number] Procedure:                Colonoscopy Indications:              Rectal bleeding Medicines:                Propofol per Anesthesia, Monitored Anesthesia Care Procedure:                Pre-Anesthesia Assessment:                           - Prior to the procedure, a History and Physical                            was performed, and patient medications and                            allergies were reviewed. The patient's tolerance of                            previous anesthesia was also reviewed. The risks                            and benefits of the procedure and the sedation                            options and risks were discussed with the patient.                            All questions were answered, and informed consent                            was obtained. Prior Anticoagulants: The patient has                            taken no previous anticoagulant or antiplatelet                            agents. ASA Grade Assessment: II - A patient with                            mild systemic disease. After reviewing the risks                            and benefits, the patient was deemed in                            satisfactory condition to undergo the procedure.                           After obtaining informed consent, the colonoscope  was passed under direct vision. Throughout the                            procedure, the patient's blood pressure, pulse, and                            oxygen saturations were monitored continuously. The                            Colonoscope was introduced through the anus and                            advanced to the the cecum, identified by                            appendiceal orifice and  ileocecal valve. The                            ileocecal valve, appendiceal orifice, and rectum                            were photographed. The colonoscopy was performed                            without difficulty. The patient tolerated the                            procedure well. The quality of the bowel                            preparation was good. The bowel preparation used                            was Miralax via split dose instruction. Scope In: 8:47:15 AM Scope Out: 9:08:29 AM Scope Withdrawal Time: 0 hours 14 minutes 40 seconds  Total Procedure Duration: 0 hours 21 minutes 14 seconds  Findings:                 The perianal and digital rectal examinations were                            normal.                           Two sessile polyps were found in the transverse                            colon. The polyps were diminutive in size. These                            polyps were removed with a cold snare. Resection                            and retrieval were complete. Verification of  patient identification for the specimen was done.                            Estimated blood loss was minimal.                           Two sessile polyps were found in the descending                            colon and cecum. The polyps were 1 to 2 mm in size.                            These polyps were removed with a cold biopsy                            forceps. Resection and retrieval were complete.                            Verification of patient identification for the                            specimen was done. Estimated blood loss was minimal.                           A patchy area of eroded (linear-pattern), friable                            (with spontaneous bleeding), furrowed and inflamed                            mucosa was found in the rectum. Biopsies were taken                            with a cold forceps for histology. Verification  of                            patient identification for the specimen was done.                            Estimated blood loss was minimal.                           The exam was otherwise without abnormality on                            direct and retroflexion views.                           The entire examined colon appeared normal. Complications:            No immediate complications. Estimated Blood Loss:     Estimated blood loss was minimal. Impression:               - Two diminutive polyps in the transverse colon,  removed with a cold snare. Resected and retrieved.                           - Two 1 to 2 mm polyps in the descending colon and                            in the cecum, removed with a cold biopsy forceps.                            Resected and retrieved.                           - Eroded (linear-pattern), friable (with                            spontaneous bleeding), furrowed and inflamed mucosa                            in the rectum. Biopsied.                           - The examination was otherwise normal on direct                            and retroflexion views.                           - The entire examined colon is normal. Recommendation:           - Patient has a contact number available for                            emergencies. The signs and symptoms of potential                            delayed complications were discussed with the                            patient. Return to normal activities tomorrow.                            Written discharge instructions were provided to the                            patient.                           - Resume previous diet.                           - Continue present medications.                           - Await pathology results.                           -  Repeat colonoscopy is recommended for                            surveillance. The colonoscopy date will be                             determined after pathology results from today's                            exam become available for review.                           - Await pathology results.                           - ? if rectal changes are ulcerative proctitis,                            prep effect, vs related to constipation ? some                            solitary rectal ulcer syndrom                           She remains constipated - using metamucil, stool                            softeners - will rx after path back Gatha Mayer, MD 08/15/2019 9:21:06 AM This report has been signed electronically.

## 2019-08-15 NOTE — Progress Notes (Signed)
Called to room to assist during endoscopic procedure.  Patient ID and intended procedure confirmed with present staff. Received instructions for my participation in the procedure from the performing physician.  

## 2019-08-15 NOTE — Progress Notes (Signed)
pt tolerated well. VSS. awake and to recovery. Report given to RN.  

## 2019-08-17 ENCOUNTER — Telehealth: Payer: Self-pay

## 2019-08-17 NOTE — Telephone Encounter (Signed)
  Follow up Call-  Call back number 08/15/2019  Post procedure Call Back phone  # [phone]  Permission to leave phone message Yes  Some recent data might be hidden     Patient questions:  Do you have a fever, pain , or abdominal swelling? No. Pain Score  0 *  Have you tolerated food without any problems? Yes.    Have you been able to return to your normal activities? Yes.    Do you have any questions about your discharge instructions: Diet   No. Medications  No. Follow up visit  No.  Do you have questions or concerns about your Care? No.  Actions: * If pain score is 4 or above: No action needed, pain <4.  1. Have you developed a fever since your procedure? no  2.   Have you had an respiratory symptoms (SOB or cough) since your procedure? no  3.   Have you tested positive for COVID 19 since your procedure no  4.   Have you had any family members/close contacts diagnosed with the COVID 19 since your procedure?  no   If yes to any of these questions please route to Joylene John, RN and Erenest Rasher, RN

## 2019-08-18 ENCOUNTER — Ambulatory Visit: Payer: No Typology Code available for payment source | Admitting: Orthopedic Surgery

## 2019-08-22 ENCOUNTER — Encounter: Payer: Self-pay | Admitting: Internal Medicine

## 2019-08-22 DIAGNOSIS — Z860101 Personal history of adenomatous and serrated colon polyps: Secondary | ICD-10-CM

## 2019-08-22 DIAGNOSIS — Z86010 Personal history of colonic polyps: Secondary | ICD-10-CM

## 2019-08-22 HISTORY — DX: Personal history of adenomatous and serrated colon polyps: Z86.0101

## 2019-08-22 HISTORY — DX: Personal history of colonic polyps: Z86.010

## 2019-09-07 ENCOUNTER — Telehealth: Payer: Self-pay

## 2019-09-07 ENCOUNTER — Ambulatory Visit (INDEPENDENT_AMBULATORY_CARE_PROVIDER_SITE_OTHER): Payer: No Typology Code available for payment source | Admitting: Orthopedic Surgery

## 2019-09-07 ENCOUNTER — Other Ambulatory Visit: Payer: Self-pay

## 2019-09-07 DIAGNOSIS — M48061 Spinal stenosis, lumbar region without neurogenic claudication: Secondary | ICD-10-CM

## 2019-09-07 DIAGNOSIS — M4800 Spinal stenosis, site unspecified: Secondary | ICD-10-CM | POA: Diagnosis not present

## 2019-09-07 NOTE — Telephone Encounter (Signed)
Patient saw Dr. Marlou Sa this morning for her MRI Review.  Do we still need to make an appointment with Dr. [name] to discuss the MRI?

## 2019-09-07 NOTE — Telephone Encounter (Signed)
Yes please, Jo Compton wants her to see [name]

## 2019-09-07 NOTE — Telephone Encounter (Signed)
Can we make her an appt with Dr. [name] to discuss the MRI and treatment options per [facility] please

## 2019-09-08 ENCOUNTER — Encounter: Payer: Self-pay | Admitting: Orthopedic Surgery

## 2019-09-08 NOTE — Progress Notes (Signed)
Office Visit Note   Patient: Jo Compton           Date of Birth: 06/01/1965           MRN: [id number] Visit Date: 09/07/2019 Requested by: [address] [place], [place] [address] [place],  [place] [address] PCP: Carollee Herter, Alferd Apa, DO  Subjective: Chief Complaint  Patient presents with  . Lower Back - Follow-up    HPI: Jo Compton is a 54 y.o. female who presents to the office complaining of low back pain.  She presents for L-spine MRI review.  She denies any significant weakness.  She denies any sensation change.  She does have significant low back pain with bilateral radicular pain down her legs that extends from her buttocks to her feet.  She describes his pain as a burning pain.  She denies any red flag symptoms such as bowel/bladder incontinence or saddle anesthesia..                ROS:  All systems reviewed are negative as they relate to the chief complaint within the history of present illness.  Patient denies fevers or chills.  Assessment & Plan: Visit Diagnoses:  1. Central stenosis of spinal canal   2. Spinal stenosis of lumbar region, unspecified whether neurogenic claudication present     Plan: Patient is a 54 year old female presents complaining of low back pain with bilateral leg radiculopathy.  She has no significant weakness on exam or sensation change on exam.  MRI of the lumbar spine was reviewed with her today.  MRI of the lumbar spine reveals degenerative changes of the lower lumbar spine with severe canal stenosis and severe bilateral subarticular recess stenosis at L4-L5.  Also reveals moderate L3-L4 canal bilateral subarticular recess stenosis and moderate bilateral foraminal stenosis at L5-S1.  With the severe nature of these findings, will refer patient to Dr. [name] for further management and discussion of the necessity of surgery versus more conservative options.  Patient agrees with this plan.  Follow-up with Dr. [name].  Follow-Up  Instructions: No follow-ups on file.   Orders:  No orders of the defined types were placed in this encounter.  No orders of the defined types were placed in this encounter.     Procedures: No procedures performed   Clinical Data: No additional findings.  Objective: Vital Signs: LMP 02/14/2015 (Approximate)   Physical Exam:  Constitutional: Patient appears well-developed HEENT:  Head: Normocephalic Eyes:EOM are normal Neck: Normal range of motion Cardiovascular: Normal rate Pulmonary/chest: Effort normal Neurologic: Patient is alert Skin: Skin is warm Psychiatric: Patient has normal mood and affect  Ortho Exam:  Tender to palpation throughout the axial lumbar spine paraspinal musculature.  Positive straight leg raise bilaterally.  5/5 motor strength of the bilateral hip flexors, quadriceps, hamstring, dorsiflexion, plantarflexion.  Sensation intact through all dermatomes of bilateral lower extremities.  No significant evidence of hypo or hyperreflexia.  Specialty Comments:  No specialty comments available.  Imaging: No results found.   PMFS History: Patient Active Problem List   Diagnosis Date Noted  . Hx of adenomatous colonic polyps 08/22/2019  . Multinodular goiter 03/09/2019  . Leg cramps 11/25/2018  . Claudication ([place]) 11/10/2018  . Wound infection 01/08/2018  . SOB (shortness of breath) 04/15/2017  . 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 Edema, lower extremity 11/08/2016  . Menorrhagia 03/20/2015  . Depression 12/05/2014  . Anemia, iron deficiency 08/03/2013  . Anal  fissure 01/17/2013  . Hemorrhage of rectum and anus 01/17/2013  . Obesity (BMI 30-39.9) 12/17/2012  . HTN (hypertension) 08/10/2012   Past Medical History:  Diagnosis Date  . Anal fissure   . Anemia   . Anxiety    no meds taken  . Arthritis   . Depression   . Fistula    rectal  . Hx of adenomatous colonic polyps 08/22/2019  . OAB (overactive bladder)   . Rectal  bleeding     Family History  Problem Relation Age of Onset  . Hypertension Mother   . COPD Mother   . Lung cancer Mother   . Colon cancer Maternal Uncle   . Liver cancer Maternal Uncle   . Hypertension Maternal Aunt   . Diabetes Maternal Aunt   . Esophageal cancer Neg Hx   . Rectal cancer Neg Hx   . Stomach cancer Neg Hx     Past Surgical History:  Procedure Laterality Date  . ABDOMINAL HYSTERECTOMY Bilateral 03/20/2015   Procedure: HYSTERECTOMY ABDOMINAL, EXCISION MESENTERIC NODULE, BLADDER FLAP BIOPSY;  Surgeon: Ena Dawley, MD;  Location: [facility];  Service: Gynecology;  Laterality: Bilateral;  . APPENDECTOMY    . BILATERAL SALPINGECTOMY Bilateral 03/20/2015   Procedure: BILATERAL SALPINGECTOMY;  Surgeon: Ena Dawley, MD;  Location: [facility];  Service: Gynecology;  Laterality: Bilateral;  . COLONOSCOPY    . CYSTOSCOPY N/A 03/20/2015   Procedure: CYSTOSCOPY;  Surgeon: Ena Dawley, MD;  Location: [facility];  Service: Gynecology;  Laterality: N/A;  . LAPAROSCOPY N/A 03/20/2015   Procedure: LAPAROSCOPY DIAGNOSTIC;  Surgeon: Ena Dawley, MD;  Location: [facility];  Service: Gynecology;  Laterality: N/A;   Social History   Occupational History  . 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 Smoking status: Never Smoker  . Smokeless tobacco: Never Used  Substance and Sexual Activity  . Alcohol use: Yes    Alcohol/week: 1.0 standard drinks    Types: 1 Glasses of wine per week  . Drug use: No  . Sexual activity: Yes    Birth control/protection: None

## 2019-09-19 ENCOUNTER — Encounter: Payer: Self-pay | Admitting: Orthopaedic Surgery

## 2019-09-19 ENCOUNTER — Other Ambulatory Visit: Payer: Self-pay

## 2019-09-19 ENCOUNTER — Ambulatory Visit (INDEPENDENT_AMBULATORY_CARE_PROVIDER_SITE_OTHER): Payer: No Typology Code available for payment source | Admitting: Orthopaedic Surgery

## 2019-09-19 ENCOUNTER — Ambulatory Visit (INDEPENDENT_AMBULATORY_CARE_PROVIDER_SITE_OTHER): Payer: No Typology Code available for payment source

## 2019-09-19 VITALS — BP 122/81 | HR 68 | Ht 67.0 in | Wt 212.0 lb

## 2019-09-19 DIAGNOSIS — M5441 Lumbago with sciatica, right side: Secondary | ICD-10-CM | POA: Diagnosis not present

## 2019-09-19 NOTE — Progress Notes (Signed)
Office Visit Note   Patient: Jo Compton           Date of Birth: 09-14-1965           MRN: [id number] Visit Date: 09/19/2019              Requested by: [address] [place], [place] [address] [place],  [place] [address] PCP: Carollee Herter, Alferd Apa, DO   Assessment & Plan: Visit Diagnoses:  1. Low back pain with right-sided sciatica, unspecified back pain laterality, unspecified chronicity     Plan: Today we reviewed MRI images as well as the report.  We discussed pathophysiology of lumbar spinal stenosis.  Flexion-extension x-rays do not show any motion.  We discussed surgical treatment would be L3-4, L4-5 decompression without instrumented fusion.  She states she would like to try the single injection.  She is concerned about the low length of time she have to be out after lumbar surgery for several weeks.  She also have claudication symptoms but at this point can walk 1 mile.  I will recheck her again in 5 weeks post epidural injection.  She is having more right than left leg symptoms. Follow-Up Instructions: rov 5 WKS    Orders:  Orders Placed This Encounter  Procedures  . XR Lumb Spine Flex&Ext Only   No orders of the defined types were placed in this encounter.     Procedures: No procedures performed   Clinical Data: No additional findings.   Subjective: Chief Complaint  Patient presents with  . Lower Back - Pain    HPI 54 year old female referred by Dr. Alphonzo Severance for evaluation of lumbar stenosis severe at L4-5 and moderately severe centrally at L3-4.  She has significant back pain more right leg pain and left leg pain.  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  She describes the pain as a burning pain.  No bowel or bladder symptoms.  Review of Systems 14 point is system positive for hypertension depression current lumbar problems, obesity otherwise negative as pertains HPI.   Objective: Vital Signs: BP  122/81   Pulse 68   Ht 5\' 7"  (1.702 m)   Wt 212 lb (96.2 kg)   LMP 02/14/2015 (Approximate)   BMI 33.20 kg/m   Physical Exam Constitutional:      Appearance: She is well-developed.  HENT:     Head: Normocephalic.     Right Ear: External ear normal.     Left Ear: External ear normal.  Eyes:     Pupils: Pupils are equal, round, and reactive to light.  Neck:     Thyroid: No thyromegaly.     Trachea: No tracheal deviation.  Cardiovascular:     Rate and Rhythm: Normal rate.  Pulmonary:     Effort: Pulmonary effort is normal.  Abdominal:     Palpations: Abdomen is soft.  Skin:    General: Skin is warm and dry.  Neurological:     Mental Status: She is alert and oriented to person, place, and time.  Psychiatric:        Behavior: Behavior normal.     Ortho Exam negative straight leg raising negative logroll the hips.  Patient able to heel and toe walk without problems.  Knee and ankle jerk are intact and symmetrical.  Patient has lumbar tenderness.  Ankle dorsiflexion plantarflexion EHL gastrocsoleus are all strong and symmetrical.  Specialty Comments:  No  specialty comments available.  Imaging: CLINICAL DATA:  Chronic low back pain with right-sided radiculopathy  EXAM: MRI LUMBAR SPINE WITHOUT CONTRAST  TECHNIQUE: Multiplanar, multisequence MR imaging of the lumbar spine was performed. No intravenous contrast was administered.  COMPARISON:  X-ray 08/02/2019  FINDINGS: Segmentation:  Standard.  Alignment:  3 mm grade 1 anterolisthesis L4 on L5.  Vertebrae: No fracture, evidence of discitis, or bone lesion. Mild diffuse intrinsic canal narrowing on the basis of congenitally short pedicles.  Conus medullaris and cauda equina: Conus extends to the L2 level. Conus appears normal. Bunching of the cauda equina nerve roots above the L3-4 and L4-5 levels of stenosis.  Paraspinal and other soft tissues: Negative.  Disc levels:  T12-L1: Sagittal sequences  only. Unremarkable.  L1-L2: Unremarkable.  L2-L3: Unremarkable.  L3-L4: Mild diffuse disc bulge with mild facet arthropathy and ligamentum flavum buckling resulting in moderate canal stenosis, moderate bilateral subarticular recess stenosis, and mild left foraminal stenosis.  L4-L5: Diffuse disc bulge with bilateral facet arthrosis and ligamentum flavum buckling result in severe canal stenosis, severe bilateral subarticular recess stenosis, and mild bilateral foraminal stenosis.  L5-S1: Mild diffuse disc bulge with small central disc protrusion. Mild bilateral facet arthrosis. Moderate bilateral foraminal stenosis. No canal stenosis.  IMPRESSION: 1. Degenerative changes of the lower lumbar spine superimposed on an intrinsically narrowed canal with severe canal stenosis and severe bilateral subarticular recess stenosis at L4-5. 2. Moderate L3-4 canal and bilateral subarticular recess stenosis. 3. Moderate bilateral L5-S1 foraminal stenosis.   Electronically Signed   By: Davina Poke D.O.   On: 08/14/2019 09:45   PMFS History: Patient Active Problem List   Diagnosis Date Noted  . Hx of adenomatous colonic polyps 08/22/2019  . Multinodular goiter 03/09/2019  . Leg cramps 11/25/2018  . Claudication ([place]) 11/10/2018  . Wound infection 01/08/2018  . SOB (shortness of breath) 04/15/2017  . 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 Edema, lower extremity 11/08/2016  . Menorrhagia 03/20/2015  . Depression 12/05/2014  . Anemia, iron deficiency 08/03/2013  . Anal fissure 01/17/2013  . Hemorrhage of rectum and anus 01/17/2013  . Obesity (BMI 30-39.9) 12/17/2012  . HTN (hypertension) 08/10/2012   Past Medical History:  Diagnosis Date  . Anal fissure   . Anemia   . Anxiety    no meds taken  . Arthritis   . Depression   . Fistula    rectal  . Hx of adenomatous colonic polyps 08/22/2019  . OAB (overactive bladder)   . Rectal bleeding     Family  History  Problem Relation Age of Onset  . Hypertension Mother   . COPD Mother   . Lung cancer Mother   . Colon cancer Maternal Uncle   . Liver cancer Maternal Uncle   . Hypertension Maternal Aunt   . Diabetes Maternal Aunt   . Esophageal cancer Neg Hx   . Rectal cancer Neg Hx   . Stomach cancer Neg Hx     Past Surgical History:  Procedure Laterality Date  . ABDOMINAL HYSTERECTOMY Bilateral 03/20/2015   Procedure: HYSTERECTOMY ABDOMINAL, EXCISION MESENTERIC NODULE, BLADDER FLAP BIOPSY;  Surgeon: Ena Dawley, MD;  Location: [facility];  Service: Gynecology;  Laterality: Bilateral;  . APPENDECTOMY    . BILATERAL SALPINGECTOMY Bilateral 03/20/2015   Procedure: BILATERAL SALPINGECTOMY;  Surgeon: Ena Dawley, MD;  Location: [facility];  Service: Gynecology;  Laterality: Bilateral;  . COLONOSCOPY    . CYSTOSCOPY N/A 03/20/2015   Procedure: CYSTOSCOPY;  Surgeon: Ena Dawley,  MD;  Location: [facility];  Service: Gynecology;  Laterality: N/A;  . LAPAROSCOPY N/A 03/20/2015   Procedure: LAPAROSCOPY DIAGNOSTIC;  Surgeon: Ena Dawley, MD;  Location: [facility];  Service: Gynecology;  Laterality: N/A;   Social History   Occupational History  . 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 Smoking status: Never Smoker  . Smokeless tobacco: Never Used  Substance and Sexual Activity  . Alcohol use: Yes    Alcohol/week: 1.0 standard drinks    Types: 1 Glasses of wine per week  . Drug use: No  . Sexual activity: Yes    Birth control/protection: None

## 2019-09-20 ENCOUNTER — Telehealth: Payer: Self-pay | Admitting: Physical Medicine and Rehabilitation

## 2019-09-20 NOTE — Telephone Encounter (Signed)
Patient is scheduled for 6/21. She wanted to double check that her insurance did not require PA. I advised that we would call her if there was a problem.

## 2019-09-28 NOTE — Telephone Encounter (Signed)
Spoke with Jo Compton and he states pa is needed, sent clinical notes to [phone]

## 2019-09-28 NOTE — Telephone Encounter (Signed)
Spoke with pt to inform that pa is pending.

## 2019-10-04 ENCOUNTER — Telehealth: Payer: Self-pay | Admitting: *Deleted

## 2019-10-06 ENCOUNTER — Other Ambulatory Visit: Payer: Self-pay | Admitting: Radiology

## 2019-10-06 DIAGNOSIS — M5441 Lumbago with sciatica, right side: Secondary | ICD-10-CM

## 2019-10-06 NOTE — Telephone Encounter (Signed)
Called pt insurance and lvm #1 to Aggie Hacker at [phone] to advise that patient has not had PT or physician guided home exercise programs. Also faxed response to [phone]

## 2019-10-06 NOTE — Telephone Encounter (Signed)
Jo Compton, RT  Ailene Rud, NT I called patient. One visit for PT ordered and she will call office to follow up with Dr. [name] after that eval. Patient would like to know how much the injection costs?  I am assuming she wants to know what total cost would be if insurance denies and she still wishes to have it. Can you help her with that?  Thanks.    Called pt and advised her I will put referral on hold until she is scheduled for PT session and I will then resubmit for prior authorization.

## 2019-10-16 ENCOUNTER — Encounter: Payer: No Typology Code available for payment source | Admitting: Physical Medicine and Rehabilitation

## 2019-10-24 ENCOUNTER — Other Ambulatory Visit: Payer: Self-pay

## 2019-10-24 ENCOUNTER — Encounter: Payer: Self-pay | Admitting: Physical Therapy

## 2019-10-24 ENCOUNTER — Ambulatory Visit: Payer: No Typology Code available for payment source | Attending: Orthopaedic Surgery | Admitting: Physical Therapy

## 2019-10-24 DIAGNOSIS — M5442 Lumbago with sciatica, left side: Secondary | ICD-10-CM | POA: Diagnosis present

## 2019-10-24 DIAGNOSIS — M5441 Lumbago with sciatica, right side: Secondary | ICD-10-CM | POA: Insufficient documentation

## 2019-10-24 DIAGNOSIS — G8929 Other chronic pain: Secondary | ICD-10-CM | POA: Insufficient documentation

## 2019-10-24 NOTE — Patient Instructions (Addendum)

## 2019-10-24 NOTE — Therapy (Signed)
[name] [address]  [place], [place], [address] Phone: [phone]   Fax:  [phone]  Physical Therapy Evaluation  Patient Details  Name: Jo Compton MRN: [id number] Date of Birth: Aug 25, 1965 Referring Provider (PT): Rodell Perna, MD   Encounter Date: 10/24/2019   PT End of Session - 10/24/19 0849    Visit Number 1    Authorization Type [employer] - VL: 10    PT Start Time 0849    PT Stop Time 0941    PT Time Calculation (min) 52 min    Activity Tolerance Patient tolerated treatment well    Behavior During Therapy [facility] for tasks assessed/performed           Past Medical History:  Diagnosis Date  . Anal fissure   . Anemia   . Anxiety    no meds taken  . Arthritis   . Depression   . Fistula    rectal  . Hx of adenomatous colonic polyps 08/22/2019  . OAB (overactive bladder)   . Rectal bleeding     Past Surgical History:  Procedure Laterality Date  . ABDOMINAL HYSTERECTOMY Bilateral 03/20/2015   Procedure: HYSTERECTOMY ABDOMINAL, EXCISION MESENTERIC NODULE, BLADDER FLAP BIOPSY;  Surgeon: Ena Dawley, MD;  Location: [facility];  Service: Gynecology;  Laterality: Bilateral;  . APPENDECTOMY    . BILATERAL SALPINGECTOMY Bilateral 03/20/2015   Procedure: BILATERAL SALPINGECTOMY;  Surgeon: Ena Dawley, MD;  Location: [facility];  Service: Gynecology;  Laterality: Bilateral;  . COLONOSCOPY    . CYSTOSCOPY N/A 03/20/2015   Procedure: CYSTOSCOPY;  Surgeon: Ena Dawley, MD;  Location: [facility];  Service: Gynecology;  Laterality: N/A;  . LAPAROSCOPY N/A 03/20/2015   Procedure: LAPAROSCOPY DIAGNOSTIC;  Surgeon: Ena Dawley, MD;  Location: [facility];  Service: Gynecology;  Laterality: N/A;    There were no vitals filed for this visit.    Subjective Assessment - 10/24/19 0850    Subjective Pt reports R>L sided LBP with LE radiculopathy with occasional numbness in feet. Pain originated "a long time ago"  but pt "just hope it would get better" however pain has been worsening. Does get some relief from ibuprofen.    Limitations Standing;House hold activities;Walking    How long can you stand comfortably? 30 minutes    How long can you walk comfortably? varies ~30 minutes on average    Diagnostic tests 08/13/19 Lumbar MRI: 1. Degenerative changes of the lower lumbar spine superimposed on an intrinsically narrowed canal with severe canal stenosis and severe bilateral subarticular recess stenosis at L4-5.  2. Moderate L3-4 canal and bilateral subarticular recess stenosis.  3. Moderate bilateral L5-S1 foraminal stenosis.  09/19/19 Lumbar x-ray: L4-5 degenerative 2 mm anterolisthesis without motion on flexion-extension views.    Patient Stated Goals "to have exercises to help with the pain"    Currently in Pain? Yes    Pain Score 5     Pain Location Leg   denies back pain currently   Pain Orientation Right;Lower    Pain Descriptors / Indicators Aching    Pain Type Chronic pain    Pain Radiating Towards typically radicular pain from low back down back of legs (R>L)    Pain Onset Other (comment)   "couple years"   Pain Frequency Intermittent    Aggravating Factors  bending    Pain Relieving Factors Ibuprofen    Effect of Pain on Daily Activities avoid bending  [facility] PT Assessment - 10/24/19 0849      Assessment   Medical Diagnosis B low back pain with B sciatica (R>L)    Referring Provider (PT) Rodell Perna, MD    Onset Date/Surgical Date --   "couple years"   Hand Dominance Right    Next MD Visit TBD    Prior Therapy none      Precautions   Precautions None      Restrictions   Weight Bearing Restrictions No      Balance Screen   Has the patient fallen in the past 6 months No    Has the patient had a decrease in activity level because of a fear of falling?  No    Is the patient reluctant to leave their home because of a fear of falling?  No      Home Manufacturing systems engineer residence      Prior Function   Level of Independence Independent    Vocation Full time employment    Vocation Requirements CNA - medication aide    Leisure shop, walk daily x 1 hr (not able to recently d/t pain)      Cognition   Overall Cognitive Status Within Functional Limits for tasks assessed      ROM / Strength   AROM / PROM / Strength AROM;Strength      AROM   AROM Assessment Site Lumbar    Lumbar Flexion fingertips to floor    Lumbar Extension 50% limited    Lumbar - Right Side Bend hand to lateral joint line of knee    Lumbar - Left Side Bend hand to lateral joint line of knee    Lumbar - Right Rotation 25% limited     Lumbar - Left Rotation 25% limited       Strength   Strength Assessment Site Hip;Knee;Ankle    Right/Left Hip Right;Left    Right Hip Flexion 4/5    Right Hip Extension 3+/5   limited ROM d/t hip flexor tigthness   Right Hip External Rotation  4+/5    Right Hip Internal Rotation 4+/5    Right Hip ABduction 3+/5    Right Hip ADduction 4-/5    Left Hip Flexion 4/5    Left Hip Extension 3+/5   limited ROM d/t hip flexor tigthness   Left Hip External Rotation 4+/5    Left Hip Internal Rotation 4+/5    Left Hip ABduction 4/5    Left Hip ADduction 4-/5    Right/Left Knee Right;Left    Right Knee Flexion 4+/5    Right Knee Extension 4+/5    Left Knee Flexion 4+/5    Left Knee Extension 4+/5    Right/Left Ankle Right;Left    Right Ankle Dorsiflexion 4+/5    Right Ankle Plantar Flexion 4+/5    Left Ankle Dorsiflexion 4+/5    Left Ankle Plantar Flexion 4+/5      Flexibility   Soft Tissue Assessment /Muscle Length yes    Hamstrings mild/mod tight B    Quadriceps mod tight quads & hip flexors B    ITB mild/mod tight B    Piriformis mild/mod tight B                      Objective measurements completed on examination: See above findings.               PT Education - 10/24/19 0941    Education  Details  PT eval findings, basic low back HEP, posture & body mechanics education    Person(s) Educated Patient    Methods Explanation;Demonstration;Handout    Comprehension Verbalized understanding;Returned demonstration               PT Long Term Goals - 10/24/19 0941      PT LONG TERM GOAL #1   Title Patient will be independent with initial HEP    Status Achieved    Target Date 10/24/19                  Plan - 10/24/19 0941    Clinical Impression Statement Jo Compton is a 54 y/o female who presents to OP PT for a one time visit for instruction in HEP for chronic B low back pain with B sciatica/radiculopathy. She reports long history of worsening LBP with no known MOI. Pain and sciatica bilateral but worse on R. Imaging revealing severe lumbar stenosis at L4-5 and moderately severe centrally at L3-4 as well as L4-5 degenerative 2 mm anterolisthesis without motion on flexion-extension views. Pain worse with bending but no directional preference identified to alleviate pain, although some relief noted with ibuprofen. Deficits include mild to moderately limited lumbar AROM with proximal LE tightness, increased muscle tension and ttp over lower lumbar paraspinals and R glutes/piriformis, and mild to moderate proximal LE weakness. [place] would benefit from skilled PT to address above deficits to restore normal flexibility and functional ROM, improve strength for better stability and to reduce or eliminate R LE pain. HEP provided to address above deficits with pt able to perform good return demonstration as well as provided educational handout for proper posture and body mechanics with typical daily tasks. Patient offered opportunity to schedule f/u visits to allow for monitoring of tolerance to HEP and modification/progression as indicated, but patient opting for one-time visit at this time.    Personal Factors and Comorbidities Time since onset of injury/illness/exacerbation;Past/Current  Experience;Fitness    Examination-Activity Limitations Bend;Caring for Others;Lift;Carry;Locomotion Level;Stand    Stability/Clinical Decision Making Stable/Uncomplicated    Clinical Decision Making Low    Rehab Potential Good    PT Frequency One time visit    PT Treatment/Interventions ADLs/Self Care Home Management;Functional mobility training;Therapeutic activities;Therapeutic exercise;Patient/family education    PT Next Visit Plan n/a    PT Home Exercise Plan Refer to patient instructions    Consulted and Agree with Plan of Care Patient           Patient will benefit from skilled therapeutic intervention in order to improve the following deficits and impairments:  Decreased activity tolerance, Decreased knowledge of precautions, Decreased mobility, Decreased range of motion, Decreased strength, Difficulty walking, Increased fascial restricitons, Increased muscle spasms, Impaired perceived functional ability, Impaired flexibility, Impaired sensation, Improper body mechanics, Postural dysfunction, Pain  Visit Diagnosis: Chronic bilateral low back pain with bilateral sciatica     Problem List Patient Active Problem List   Diagnosis Date Noted  . Hx of adenomatous colonic polyps 08/22/2019  . Multinodular goiter 03/09/2019  . Leg cramps 11/25/2018  . Claudication ([place]) 11/10/2018  . Wound infection 01/08/2018  . SOB (shortness of breath) 04/15/2017  . 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 Edema, lower extremity 11/08/2016  . Menorrhagia 03/20/2015  . Depression 12/05/2014  . Anemia, iron deficiency 08/03/2013  . Anal fissure 01/17/2013  . Hemorrhage of rectum and anus 01/17/2013  . Obesity (BMI 30-39.9) 12/17/2012  . HTN (hypertension) 08/10/2012    Georgian Co  Luis Abed, PT, MPT 10/24/2019, 2:22 PM  [facility] [address]  [place], [place], [address] Phone: [phone]   Fax:  [phone]  Name:  Jo Compton MRN: [id number] Date of Birth: 16-Jul-1965

## 2019-11-21 ENCOUNTER — Telehealth: Payer: Self-pay | Admitting: Orthopaedic Surgery

## 2019-11-21 ENCOUNTER — Other Ambulatory Visit: Payer: Self-pay | Admitting: Family Medicine

## 2019-11-21 ENCOUNTER — Other Ambulatory Visit: Payer: Self-pay

## 2019-11-21 DIAGNOSIS — R600 Localized edema: Secondary | ICD-10-CM

## 2019-11-21 DIAGNOSIS — I10 Essential (primary) hypertension: Secondary | ICD-10-CM

## 2019-11-21 DIAGNOSIS — J069 Acute upper respiratory infection, unspecified: Secondary | ICD-10-CM

## 2019-11-21 MED ORDER — FLUTICASONE PROPIONATE 50 MCG/ACT NA SUSP: 2.0000 | Freq: Every day | NASAL | 2 refills | Status: DC

## 2019-11-21 MED ORDER — HYDROCHLOROTHIAZIDE 25 MG PO TABS: 25.0000 mg | ORAL_TABLET | Freq: Every day | ORAL | 1 refills | Status: DC

## 2019-11-21 NOTE — Telephone Encounter (Signed)
noted 

## 2019-11-21 NOTE — Telephone Encounter (Signed)
I was able to reopen the original referral, so we won't need a new one. We will resubmit for authorization.

## 2019-11-21 NOTE — Telephone Encounter (Signed)
Could you please look at notes on previous referral and help? Jo Compton was helping patient with this. It looks like referral was cancelled. Do I need to enter another one?

## 2019-11-21 NOTE — Telephone Encounter (Signed)
Patient called inquiring about right vs bilateral TF-Yates. Patient stated she has finished PT and calling for insurance updates of back injections from Dr. Romona Curls office. Patient was referred from [place]. Please give patient a call about insurance approval for back injections. Patient phone number is [phone].

## 2020-01-02 ENCOUNTER — Telehealth: Payer: Self-pay | Admitting: Internal Medicine

## 2020-01-02 NOTE — Telephone Encounter (Signed)
Patient is calling states she is having some pain on her back side for about a week now

## 2020-01-02 NOTE — Telephone Encounter (Signed)
Patient reports rectal and "butt cheek pain".  She is offered an appt for tomorrow, but declined due to work schedules.  She will come in and see Tye Savoy RNP on 01/30/20

## 2020-01-30 ENCOUNTER — Ambulatory Visit: Payer: No Typology Code available for payment source | Admitting: Nurse Practitioner

## 2020-03-14 ENCOUNTER — Ambulatory Visit: Payer: No Typology Code available for payment source | Admitting: Internal Medicine

## 2020-04-11 IMAGING — US US THYROID
1 series · 12 of 25 positions shown · non-contrast
Comparison: None.

CLINICAL DATA: Palpable abnormality. Enlarged thyroid gland on
physical examination.

EXAM:
THYROID ULTRASOUND
TECHNIQUE: Ultrasound examination of the thyroid gland and adjacent soft
tissues was performed.

[Series 1: us thyroid · 12 of 25 slices shown]
[im 2/25]
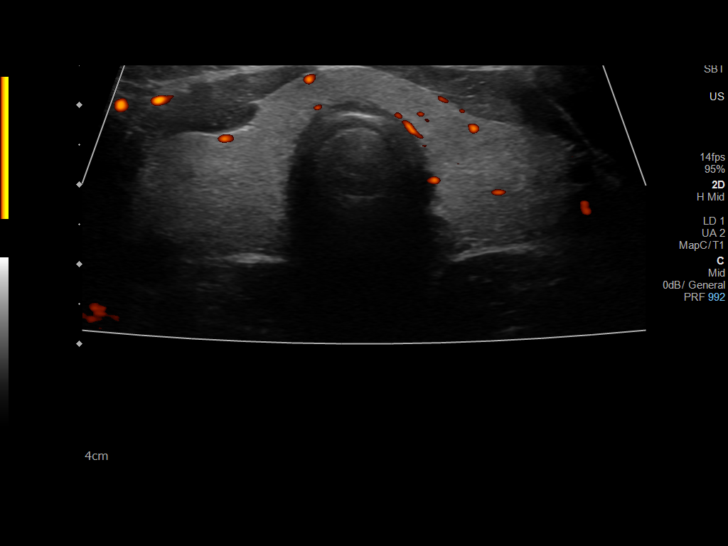
[im 4/25]
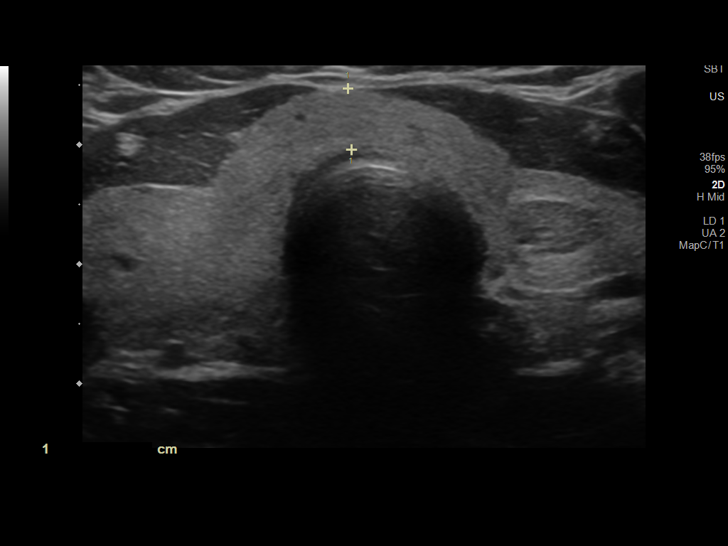
[im 6/25]
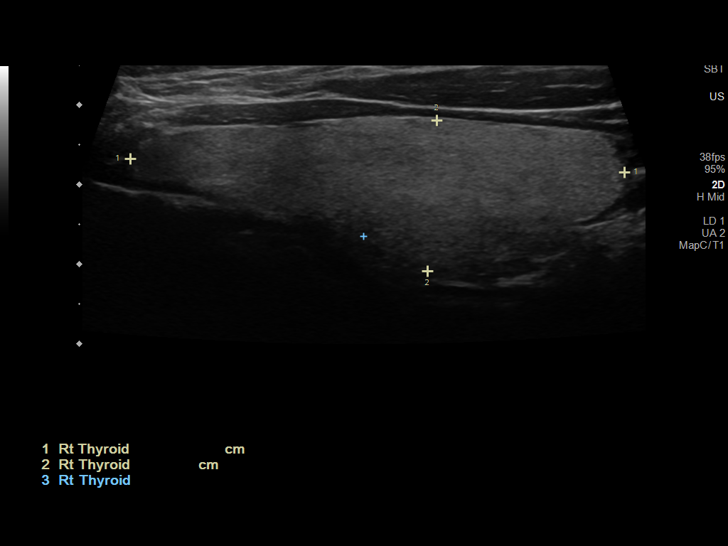
[im 8/25]
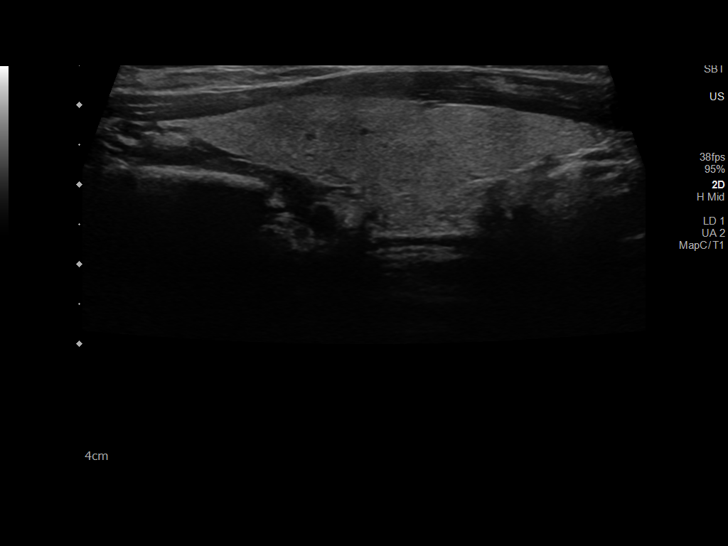
[im 10/25]
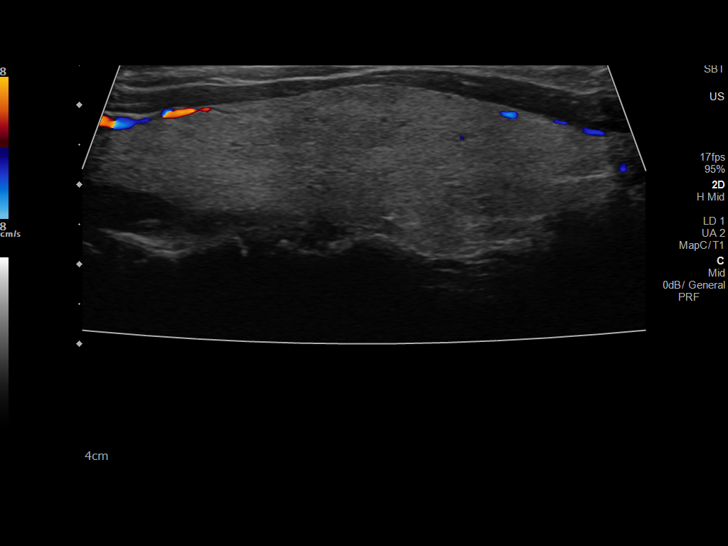
[im 12/25]
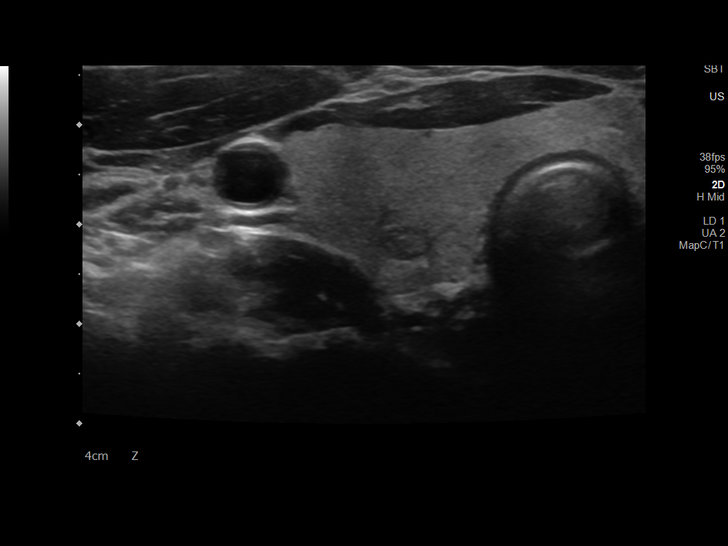
[im 14/25]
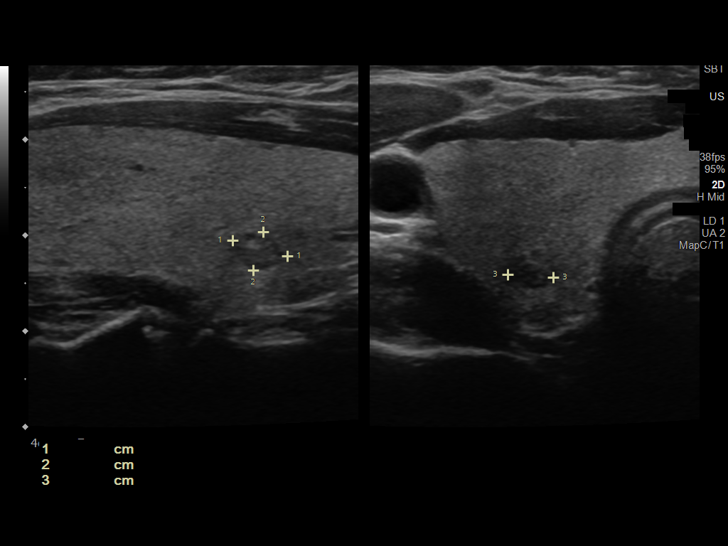
[im 16/25]
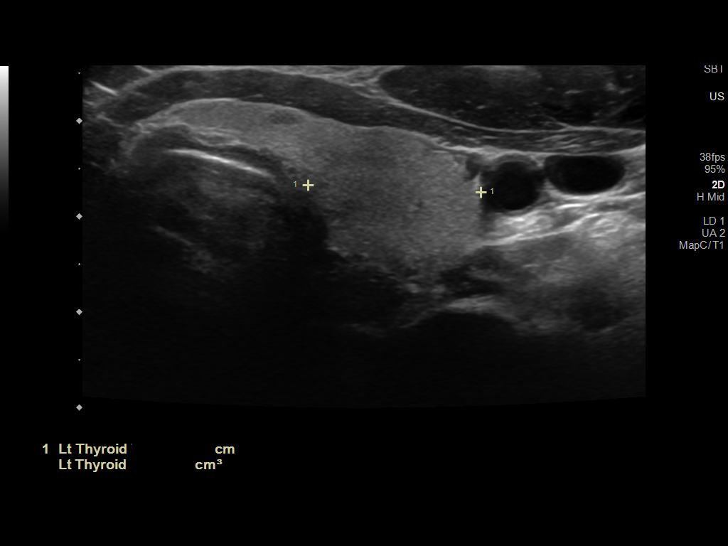
[im 18/25]
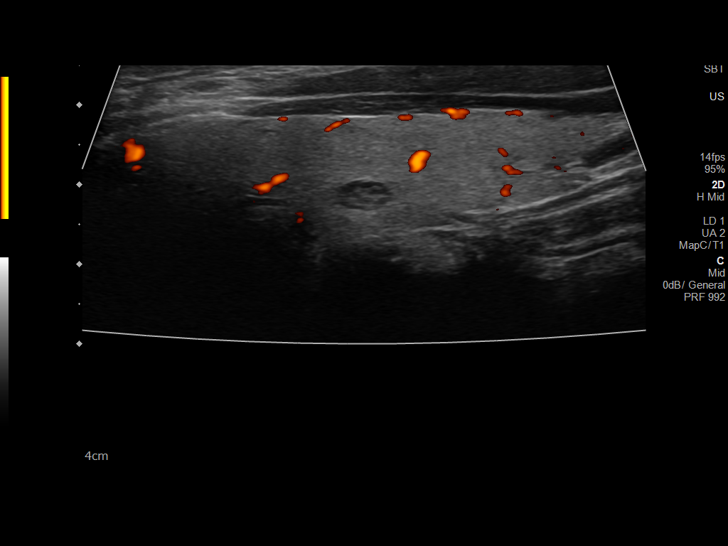
[im 20/25]
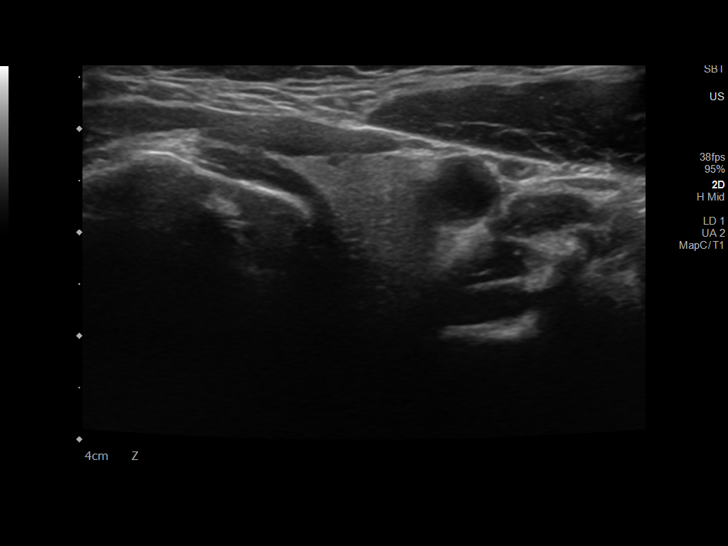
[im 22/25]
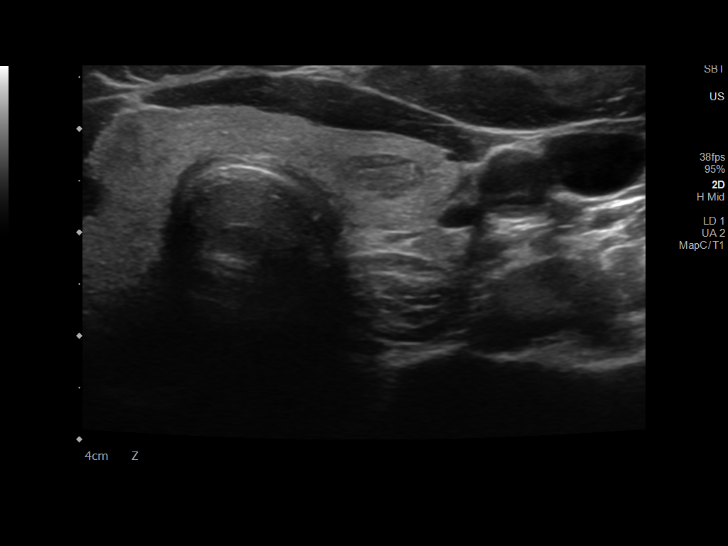
[im 24/25]
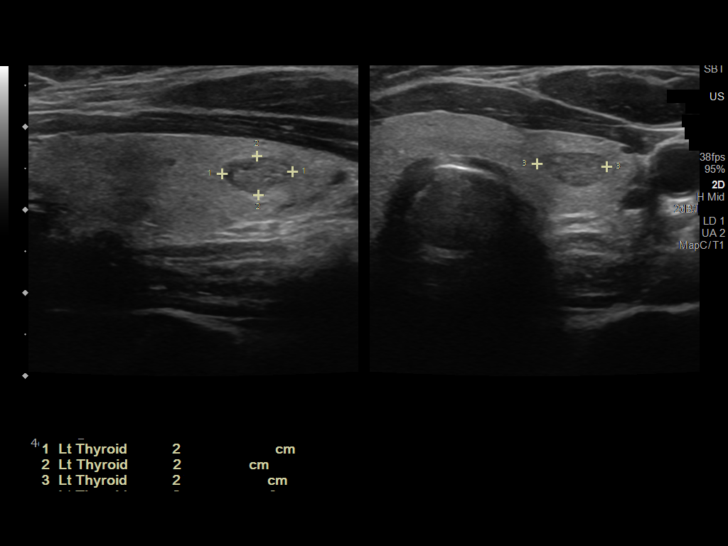

[12 of 25 positions shown; findings below may reference images not displayed]

FINDINGS: Parenchymal Echotexture: Normal

Isthmus: 0.5 cm

Right lobe: 6.2 x 1.9 x 2.1 cm

Left lobe: 5.9 x 1.8 x 1.8 cm

_________________________________________________________

Estimated total number of nodules >/= 1 cm: 0

Number of spongiform nodules >/=  2 cm not described below (TR1): 0

Number of mixed cystic and solid nodules >/= 1.5 cm not described
below (TR2): 0

_________________________________________________________

Nodule # 1:

Location: Right; Inferior

Maximum size: 0.6 cm; Other 2 dimensions: 0.4 x 0.5 cm

Composition: solid/almost completely solid (2)

Echogenicity: hypoechoic (2)

Shape: not taller-than-wide (0)

Margins: smooth (0)

Echogenic foci: none (0)

ACR TI-RADS total points: 4.

ACR TI-RADS risk category: TR4 (4-6 points).

ACR TI-RADS recommendations:

Given size (<0.9 cm) and appearance, this nodule does NOT meet
TI-RADS criteria for biopsy or dedicated follow-up.

_________________________________________________________

Nodule # 2:

Location: Isthmus

Maximum size: 0.6 cm; Other 2 dimensions: 0.3 x 0.6 cm

Composition: cystic/almost completely cystic (0)

Echogenicity: anechoic (0)

Shape: not taller-than-wide (0)

Margins: smooth (0)

Echogenic foci: none (0)

ACR TI-RADS total points: 0.

ACR TI-RADS risk category: TR1 (0-1 points).

ACR TI-RADS recommendations:

This nodule does NOT meet TI-RADS criteria for biopsy or dedicated
follow-up.

_________________________________________________________

Nodule # 3:

Location: Left; Mid

Maximum size: 0.8 cm; Other 2 dimensions: 0.4 x 0.6 cm

Composition: solid/almost completely solid (2)

Echogenicity: hypoechoic (2)

Shape: not taller-than-wide (0)

Margins: smooth (0)

Echogenic foci: none (0)

ACR TI-RADS total points: 4.

ACR TI-RADS risk category: TR4 (4-6 points).

ACR TI-RADS recommendations:

Given size (<0.9 cm) and appearance, this nodule does NOT meet
TI-RADS criteria for biopsy or dedicated follow-up.

_________________________________________________________

Nodule # 4:

Location: Left; Inferior

Maximum size: 0.9 cm; Other 2 dimensions: 0.8 x 0.5 cm

Composition: solid/almost completely solid (2)

Echogenicity: hypoechoic (2)

Shape: not taller-than-wide (0)

Margins: smooth (0)

Echogenic foci: none (0)

ACR TI-RADS total points: 4.

ACR TI-RADS risk category: TR4 (4-6 points).

ACR TI-RADS recommendations:

Given size (<0.9 cm) and appearance, this nodule does NOT meet
TI-RADS criteria for biopsy or dedicated follow-up.

_________________________________________________________

No abnormal lymph nodes identified.
IMPRESSION: Mildly enlarged thyroid gland. Four measurable small nodules, 3 of
which are solid and 1 representing a simple cyst. None of these meet
criteria for biopsy or dedicated follow-up.

The above is in keeping with the ACR TI-RADS recommendations - [HOSPITAL] 4101;[DATE].

## 2020-05-01 ENCOUNTER — Ambulatory Visit: Payer: No Typology Code available for payment source | Admitting: Internal Medicine

## 2020-05-17 ENCOUNTER — Other Ambulatory Visit: Payer: Self-pay

## 2020-05-20 ENCOUNTER — Ambulatory Visit (INDEPENDENT_AMBULATORY_CARE_PROVIDER_SITE_OTHER): Payer: 59 | Admitting: Family Medicine

## 2020-05-20 ENCOUNTER — Other Ambulatory Visit: Payer: Self-pay

## 2020-05-20 ENCOUNTER — Encounter: Payer: Self-pay | Admitting: Family Medicine

## 2020-05-20 VITALS — BP 136/86 | HR 74 | Temp 98.0°F | Resp 12 | Ht 67.0 in | Wt 220.4 lb

## 2020-05-20 DIAGNOSIS — K219 Gastro-esophageal reflux disease without esophagitis: Secondary | ICD-10-CM | POA: Diagnosis not present

## 2020-05-20 DIAGNOSIS — Z23 Encounter for immunization: Secondary | ICD-10-CM

## 2020-05-20 DIAGNOSIS — G8929 Other chronic pain: Secondary | ICD-10-CM

## 2020-05-20 DIAGNOSIS — R1031 Right lower quadrant pain: Secondary | ICD-10-CM | POA: Insufficient documentation

## 2020-05-20 LAB — COMPREHENSIVE METABOLIC PANEL
ALT: 21 U/L (ref 0–35)
AST: 20 U/L (ref 0–37)
Albumin: 4.5 g/dL (ref 3.5–5.2)
Alkaline Phosphatase: 80 U/L (ref 39–117)
BUN: 11 mg/dL (ref 6–23)
CO2: 30 mEq/L (ref 19–32)
Calcium: 9.9 mg/dL (ref 8.4–10.5)
Chloride: 103 mEq/L (ref 96–112)
Creatinine, Ser: 0.83 mg/dL (ref 0.40–1.20)
GFR: 80.97 mL/min (ref >60.00)
Glucose, Bld: 84 mg/dL (ref 70–99)
Potassium: 4.1 mEq/L (ref 3.5–5.1)
Sodium: 139 mEq/L (ref 135–145)
Total Bilirubin: 0.3 mg/dL (ref 0.2–1.2)
Total Protein: 7.1 g/dL (ref 6.0–8.3)

## 2020-05-20 LAB — LIPID PANEL
Cholesterol: 203 mg/dL — ABNORMAL HIGH (ref 0–200)
HDL: 51.3 mg/dL (ref >39.00)
LDL Cholesterol: 134 mg/dL — ABNORMAL HIGH (ref 0–99)
NonHDL: 151.38
Total CHOL/HDL Ratio: 4
Triglycerides: 88 mg/dL (ref 0.0–149.0)
VLDL: 17.6 mg/dL (ref 0.0–40.0)

## 2020-05-20 LAB — CBC WITH DIFFERENTIAL/PLATELET
Basophils Absolute: 0 10*3/uL (ref 0.0–0.1)
Basophils Relative: 0.2 % (ref 0.0–3.0)
Eosinophils Absolute: 0.1 10*3/uL (ref 0.0–0.7)
Eosinophils Relative: 1.3 % (ref 0.0–5.0)
HCT: 42.8 % (ref 36.0–46.0)
Hemoglobin: 13.9 g/dL (ref 12.0–15.0)
Lymphocytes Relative: 39.4 % (ref 12.0–46.0)
Lymphs Abs: 2.2 10*3/uL (ref 0.7–4.0)
MCHC: 32.5 g/dL (ref 30.0–36.0)
MCV: 77.7 fl — ABNORMAL LOW (ref 78.0–100.0)
Monocytes Absolute: 0.4 10*3/uL (ref 0.1–1.0)
Monocytes Relative: 7.8 % (ref 3.0–12.0)
Neutro Abs: 2.9 10*3/uL (ref 1.4–7.7)
Neutrophils Relative %: 51.3 % (ref 43.0–77.0)
Platelets: 247 10*3/uL (ref 150.0–400.0)
RBC: 5.5 Mil/uL — ABNORMAL HIGH (ref 3.87–5.11)
RDW: 14.3 % (ref 11.5–15.5)
WBC: 5.6 10*3/uL (ref 4.0–10.5)

## 2020-05-20 LAB — POC URINALSYSI DIPSTICK (AUTOMATED)
Bilirubin, UA: NEGATIVE
Blood, UA: NEGATIVE
Glucose, UA: NEGATIVE
Ketones, UA: NEGATIVE
Leukocytes, UA: NEGATIVE
Nitrite, UA: NEGATIVE
Protein, UA: POSITIVE — AB
Spec Grav, UA: >=1.03 — AB (ref 1.010–1.025)
Urobilinogen, UA: 1 E.U./dL
pH, UA: 6 (ref 5.0–8.0)

## 2020-05-20 MED ORDER — FAMOTIDINE 20 MG PO TABS: 20.0000 mg | ORAL_TABLET | Freq: Two times a day (BID) | ORAL | 3 refills | Status: DC

## 2020-05-20 NOTE — Patient Instructions (Signed)
Abdominal Pain, Adult Pain in the abdomen (abdominal pain) can be caused by many things. Often, abdominal pain is not serious and it gets better with no treatment or by being treated at home. However, sometimes abdominal pain is serious. Your health care provider will ask questions about your medical history and do a physical exam to try to determine the cause of your abdominal pain. Follow these instructions at home: Medicines  Take over-the-counter and prescription medicines only as told by your health care provider.  Do not take a laxative unless told by your health care provider. General instructions  Watch your condition for any changes.  Drink enough fluid to keep your urine pale yellow.  Keep all follow-up visits as told by your health care provider. This is important.   Contact a health care provider if:  Your abdominal pain changes or gets worse.  You are not hungry or you lose weight without trying.  You are constipated or have diarrhea for more than 2-3 days.  You have pain when you urinate or have a bowel movement.  Your abdominal pain wakes you up at night.  Your pain gets worse with meals, after eating, or with certain foods.  You are vomiting and cannot keep anything down.  You have a fever.  You have blood in your urine. Get help right away if:  Your pain does not go away as soon as your health care provider told you to expect.  You cannot stop vomiting.  Your pain is only in areas of the abdomen, such as the right side or the left lower portion of the abdomen. Pain on the right side could be caused by appendicitis.  You have bloody or black stools, or stools that look like tar.  You have severe pain, cramping, or bloating in your abdomen.  You have signs of dehydration, such as: ? Dark urine, very little urine, or no urine. ? Cracked lips. ? Dry mouth. ? Sunken eyes. ? Sleepiness. ? Weakness.  You have trouble breathing or chest  pain. Summary  Often, abdominal pain is not serious and it gets better with no treatment or by being treated at home. However, sometimes abdominal pain is serious.  Watch your condition for any changes.  Take over-the-counter and prescription medicines only as told by your health care provider.  Contact a health care provider if your abdominal pain changes or gets worse.  Get help right away if you have severe pain, cramping, or bloating in your abdomen. This information is not intended to replace advice given to you by your health care provider. Make sure you discuss any questions you have with your health care provider. Document Revised: 06/02/2019 Document Reviewed: 08/22/2018 Elsevier Patient Education  [employer].

## 2020-05-20 NOTE — Assessment & Plan Note (Addendum)
pepcid daily Consider GI if no better Watch spicy foods, caffeine, peppermint and acidic foods

## 2020-05-20 NOTE — Assessment & Plan Note (Signed)
Doubt ap pain is very mild--- ? Ovarian Check [place]  Check labs

## 2020-05-20 NOTE — Progress Notes (Signed)
Patient ID: Jo Compton, female    DOB: 1965/08/28  Age: 55 y.o. MRN: [id number]    Subjective:  Subjective  HPI Jo Compton presents for rlq abd pain xseveral weeks   + nausea, no diarrhea  No fever   No associated with eating   Review of Systems  Constitutional: Negative for appetite change, diaphoresis, fatigue and unexpected weight change.  Eyes: Negative for pain, redness and visual disturbance.  Respiratory: Negative for cough, chest tightness, shortness of breath and wheezing.   Cardiovascular: Negative for chest pain, palpitations and leg swelling.  Gastrointestinal: Positive for abdominal pain and nausea. Negative for vomiting.  Endocrine: Negative for cold intolerance, heat intolerance, polydipsia, polyphagia and polyuria.  Genitourinary: Negative for difficulty urinating, dysuria and frequency.  Musculoskeletal: Positive for arthralgias.  Neurological: Negative for dizziness, light-headedness, numbness and headaches.    History Past Medical History:  Diagnosis Date  . Anal fissure   . Anemia   . Anxiety    no meds taken  . Arthritis   . Depression   . Fistula    rectal  . Hx of adenomatous colonic polyps 08/22/2019  . OAB (overactive bladder)   . Rectal bleeding     She has a past surgical history that includes Appendectomy; Colonoscopy; Abdominal hysterectomy (Bilateral, 03/20/2015); laparoscopy (N/A, 03/20/2015); Bilateral salpingectomy (Bilateral, 03/20/2015); and Cystoscopy (N/A, 03/20/2015).   Her family history includes COPD in her mother; Colon cancer in her maternal uncle; Diabetes in her maternal aunt; Hypertension in her maternal aunt and mother; Liver cancer in her maternal uncle; Lung cancer in her mother.She reports that she has never smoked. She has never used smokeless tobacco. She reports current alcohol use of about 1.0 standard drink of alcohol per week. She reports that she does not use drugs.  Current Outpatient Medications on File Prior to  Visit  Medication Sig Dispense Refill  . dilTIAZem HCl POWD APPLY RECTALLY TWICE DAILY. (Patient taking differently: APPLY RECTALLY TWICE DAILY. USES PRN) 30 g 2  . docusate sodium (COLACE) 100 MG capsule Take 1 capsule (100 mg total) by mouth 2 (two) times daily. 120 capsule 2  . fluticasone (FLONASE) 50 MCG/ACT nasal spray Place 2 sprays into both nostrils daily. 16 g 2  . hydrochlorothiazide (HYDRODIURIL) 25 MG tablet Take 1 tablet (25 mg total) by mouth daily. 90 tablet 1  . ibuprofen (ADVIL) 800 MG tablet Take 1 tablet (800 mg total) by mouth every 8 (eight) hours as needed. 30 tablet 0  . meclizine (ANTIVERT) 25 MG tablet Take 1 tablet (25 mg total) by mouth 3 (three) times daily as needed for dizziness. 21 tablet 0   No current facility-administered medications on file prior to visit.     Objective:  Objective  Physical Exam Vitals and nursing note reviewed.  Constitutional:      Appearance: She is well-developed and well-nourished.  HENT:     Head: Normocephalic and atraumatic.  Eyes:     Extraocular Movements: EOM normal.     Conjunctiva/sclera: Conjunctivae normal.  Neck:     Thyroid: No thyromegaly.     Vascular: No carotid bruit or JVD.  Cardiovascular:     Rate and Rhythm: Normal rate and regular rhythm.     Heart sounds: Normal heart sounds. No murmur heard.   Pulmonary:     Effort: Pulmonary effort is normal. No respiratory distress.     Breath sounds: Normal breath sounds. No wheezing or rales.  Chest:     Chest  wall: No tenderness.  Abdominal:     General: Bowel sounds are normal. There is no distension.     Palpations: Abdomen is soft.     Tenderness: There is abdominal tenderness in the right lower quadrant. There is no right CVA tenderness, left CVA tenderness, guarding or rebound.  Musculoskeletal:        General: No edema.     Cervical back: Normal range of motion and neck supple.  Neurological:     Mental Status: She is alert and oriented to person,  place, and time.  Psychiatric:        Mood and Affect: Mood and affect normal.    BP 136/86 (BP Location: Right Arm, Cuff Size: Large)   Pulse 74   Temp 98 F (36.7 C) (Oral)   Resp 12   Ht 5\' 7"  (1.702 m)   Wt 220 lb 6.4 oz (100 kg)   LMP 02/14/2015 (Approximate)   SpO2 98%   BMI 34.52 kg/m  Wt Readings from Last 3 Encounters:  05/20/20 220 lb 6.4 oz (100 kg)  09/19/19 212 lb (96.2 kg)  08/15/19 216 lb (98 kg)     Lab Results  Component Value Date   WBC 5.6 05/20/2020   HGB 13.9 05/20/2020   HCT 42.8 05/20/2020   PLT 247.0 05/20/2020   GLUCOSE 84 05/20/2020   CHOL 203 (H) 05/20/2020   TRIG 88.0 05/20/2020   HDL 51.30 05/20/2020   LDLCALC 134 (H) 05/20/2020   ALT 21 05/20/2020   AST 20 05/20/2020   NA 139 05/20/2020   K 4.1 05/20/2020   CL 103 05/20/2020   CREATININE 0.83 05/20/2020   BUN 11 05/20/2020   CO2 30 05/20/2020   TSH 0.54 12/06/2018   HGBA1C 6.2 12/06/2018   MICROALBUR 0.1 06/07/2012    MR Lumbar Spine w/o contrast  Result Date: 08/14/2019 CLINICAL DATA:  Chronic low back pain with right-sided radiculopathy EXAM: MRI LUMBAR SPINE WITHOUT CONTRAST TECHNIQUE: Multiplanar, multisequence MR imaging of the lumbar spine was performed. No intravenous contrast was administered. COMPARISON:  X-ray 08/02/2019 FINDINGS: Segmentation:  Standard. Alignment:  3 mm grade 1 anterolisthesis L4 on L5. Vertebrae: No fracture, evidence of discitis, or bone lesion. Mild diffuse intrinsic canal narrowing on the basis of congenitally short pedicles. Conus medullaris and cauda equina: Conus extends to the L2 level. Conus appears normal. Bunching of the cauda equina nerve roots above the L3-4 and L4-5 levels of stenosis. Paraspinal and other soft tissues: Negative. Disc levels: T12-L1: Sagittal sequences only. Unremarkable. L1-L2: Unremarkable. L2-L3: Unremarkable. L3-L4: Mild diffuse disc bulge with mild facet arthropathy and ligamentum flavum buckling resulting in moderate canal  stenosis, moderate bilateral subarticular recess stenosis, and mild left foraminal stenosis. L4-L5: Diffuse disc bulge with bilateral facet arthrosis and ligamentum flavum buckling result in severe canal stenosis, severe bilateral subarticular recess stenosis, and mild bilateral foraminal stenosis. L5-S1: Mild diffuse disc bulge with small central disc protrusion. Mild bilateral facet arthrosis. Moderate bilateral foraminal stenosis. No canal stenosis. IMPRESSION: 1. Degenerative changes of the lower lumbar spine superimposed on an intrinsically narrowed canal with severe canal stenosis and severe bilateral subarticular recess stenosis at L4-5. 2. Moderate L3-4 canal and bilateral subarticular recess stenosis. 3. Moderate bilateral L5-S1 foraminal stenosis. Electronically Signed   By: Davina Poke D.O.   On: 08/14/2019 09:45     Assessment & Plan:  Plan  I have discontinued Amado Nash. Akter's azelastine, ondansetron, gabapentin, and predniSONE. I am also having her start on famotidine.  Additionally, I am having her maintain her docusate sodium, ibuprofen, dilTIAZem HCl, meclizine, hydrochlorothiazide, and fluticasone.  Meds ordered this encounter  Medications  . famotidine (PEPCID) 20 MG tablet    Sig: Take 1 tablet (20 mg total) by mouth 2 (two) times daily.    Dispense:  60 tablet    Refill:  3    Problem List Items Addressed This Visit      Unprioritized   Chronic RLQ pain - Primary    Doubt ap pain is very mild--- ? Ovarian Check [place]  Check labs      Relevant Orders   US Pelvic Complete With Transvaginal   Lipid panel (Completed)   Comprehensive metabolic panel (Completed)   CBC with Differential/Platelet (Completed)   DG HIP UNILAT WITH PELVIS 2-3 VIEWS RIGHT   POCT Urinalysis Dipstick (Automated) (Completed)   Gastroesophageal reflux disease    pepcid daily Consider GI if no better Watch spicy foods, caffeine, peppermint and acidic foods       Relevant Medications    famotidine (PEPCID) 20 MG tablet    Other Visit Diagnoses    Influenza vaccine administered       Relevant Orders   Flu Vaccine QUAD 6+ mos PF IM (Fluarix Quad PF) (Completed)      Follow-up: Return if symptoms worsen or fail to improve.  [name], DO

## 2020-05-23 ENCOUNTER — Other Ambulatory Visit: Payer: Self-pay

## 2020-05-23 ENCOUNTER — Ambulatory Visit (HOSPITAL_BASED_OUTPATIENT_CLINIC_OR_DEPARTMENT_OTHER)
Admission: RE | Admit: 2020-05-23 | Discharge: 2020-05-23 | Disposition: A | Discharge status: Home / Self Care | Payer: 59 | Source: Ambulatory Visit | Attending: Family Medicine | Admitting: Family Medicine

## 2020-05-23 DIAGNOSIS — G8929 Other chronic pain: Secondary | ICD-10-CM

## 2020-05-23 DIAGNOSIS — R1031 Right lower quadrant pain: Secondary | ICD-10-CM | POA: Diagnosis present

## 2020-05-27 ENCOUNTER — Other Ambulatory Visit: Payer: Self-pay

## 2020-05-27 DIAGNOSIS — R1031 Right lower quadrant pain: Secondary | ICD-10-CM

## 2020-05-27 DIAGNOSIS — G8929 Other chronic pain: Secondary | ICD-10-CM

## 2020-06-26 ENCOUNTER — Ambulatory Visit (INDEPENDENT_AMBULATORY_CARE_PROVIDER_SITE_OTHER): Payer: 59 | Admitting: Family Medicine

## 2020-06-26 ENCOUNTER — Other Ambulatory Visit: Payer: Self-pay

## 2020-06-26 ENCOUNTER — Encounter: Payer: Self-pay | Admitting: Family Medicine

## 2020-06-26 VITALS — BP 133/86 | HR 67 | Ht 67.0 in | Wt 220.0 lb

## 2020-06-26 DIAGNOSIS — Z1231 Encounter for screening mammogram for malignant neoplasm of breast: Secondary | ICD-10-CM | POA: Diagnosis not present

## 2020-06-26 DIAGNOSIS — R1032 Left lower quadrant pain: Secondary | ICD-10-CM

## 2020-06-26 DIAGNOSIS — N951 Menopausal and female climacteric states: Secondary | ICD-10-CM

## 2020-06-26 DIAGNOSIS — N809 Endometriosis, unspecified: Secondary | ICD-10-CM

## 2020-06-26 NOTE — Progress Notes (Signed)
Patient presents for chronic Right lower quadrant pain. Pain comes and goes.   Patient also requests a screening mammogram be ordered as she is due for one. Kathrene Alu, RN

## 2020-06-26 NOTE — Progress Notes (Signed)
Subjective:    Patient ID: Jo Compton, female    DOB: [birth year], 55 y.o.   MRN: [id number]  HPI  This is a 55 yo G1P1 who was referred to [place] by her PCP for LLQ abdominal pain. She had a TAH in 2016 with biopsies mesentaric nodule (benign) and biopsy of bladder flap (endometriosis). Prior to her hysterectomy, she had heavy periods and abdominal pain. The patient isn't able to relay any information about her abdominal pain prior to her surgery.   She has been on estrogen replacement therapy over the past several months due to vasomotor symptoms as result of menopause.  She finds that it helps with vaginal dryness.  Few months after being on estrogen therapy, patient developed left lower quadrant abdominal pain.  Did have some nausea, but no fevers, constipation, diarrhea.  Pain was improved somewhat with sitting and with prolonged with movement.  After abdominal pain for 4 months, she stopped the estrogen and her pain has diminished.  No other palliating or provoking factors patient.  Overall, her pain is much improved.  She saw her PCP in January, who ordered an [place].   I have reviewed the patients past medical, family, and social history.  I have reviewed the patient's medication list and allergies.  Review of Systems  All other systems reviewed and are negative.      Objective:   Physical Exam Vitals and nursing note reviewed.  Constitutional:      Appearance: Normal appearance.  Cardiovascular:     Rate and Rhythm: Normal rate and regular rhythm.     Pulses: Normal pulses.     Heart sounds: Normal heart sounds.  Pulmonary:     Effort: Pulmonary effort is normal.     Breath sounds: Normal breath sounds.  Abdominal:     General: Abdomen is flat. There is no distension.     Palpations: Abdomen is soft. There is no mass.     Tenderness: There is no abdominal tenderness.     Hernia: No hernia is present.  Skin:    General: Skin is warm and dry.     Capillary Refill: Capillary  refill takes less than 2 seconds.  Neurological:     General: No focal deficit present.     Mental Status: She is alert.  Psychiatric:        Mood and Affect: Mood normal.        Behavior: Behavior normal.        Thought Content: Thought content normal.        Judgment: Judgment normal.    TECHNIQUE: Both transabdominal and transvaginal ultrasound examinations of the pelvis were performed. Transabdominal technique was performed for global imaging of the pelvis including uterus, ovaries, adnexal regions, and pelvic cul-de-sac. It was necessary to proceed with endovaginal exam following the transabdominal exam to visualize the adnexal structures.  COMPARISON:  None  FINDINGS: Uterus  Surgically absent  Right ovary  Not visualized.  Left ovary  Not visualized.  Other findings  No abnormal free fluid.  IMPRESSION: No pelvic mass.  Prior hysterectomy.   Electronically Signed   By: Lovey Newcomer M.D.   On: 05/23/2020 16:02     Assessment & Plan:  1. Menopausal vasomotor syndrome I discussed for reducing vasomotor symptoms including antidepressants, gabapentin, estrogen therapy. The patient thinks that restarting the estrogen, but at a lower dose would be cost for her at this time.  2. Endometriosis Question of whether patient with pain is  related to Demetrio cysts.  Although most people with endometriosis experience a reduction in pain during menopause, there are some people who experience abdominal pain with estrogen supplementation.  Mostly pain returns with lower dose of estrogen.  3. Left lower quadrant abdominal pain Uncertain etiology, possible endometriosis with estrogen replacement therapy. [place] negative. F/u in 2 months.  4. Screening mammogram for breast cancer - [facility] Digital Screening; Future

## 2020-06-27 ENCOUNTER — Telehealth: Payer: Self-pay | Admitting: Internal Medicine

## 2020-06-27 MED ORDER — HYDROCORTISONE (PERIANAL) 2.5 % EX CREA: 1.0000 "application " | TOPICAL_CREAM | Freq: Every day | CUTANEOUS | 0 refills | Status: DC | PRN

## 2020-06-27 NOTE — Telephone Encounter (Signed)
Inbound call from patient requesting additional refill for Anusol cream be sent to [facility] on [address] [facility] in [facility] please.

## 2020-06-27 NOTE — Telephone Encounter (Signed)
Patient informed that Anusol rectal cream refilled as requested. She will call back if it doesn't help.

## 2020-07-10 ENCOUNTER — Encounter: Payer: Self-pay | Admitting: Family Medicine

## 2020-07-10 ENCOUNTER — Telehealth (INDEPENDENT_AMBULATORY_CARE_PROVIDER_SITE_OTHER): Payer: 59 | Admitting: Family Medicine

## 2020-07-10 DIAGNOSIS — H6692 Otitis media, unspecified, left ear: Secondary | ICD-10-CM

## 2020-07-10 MED ORDER — CEFDINIR 300 MG PO CAPS: 300.0000 mg | ORAL_CAPSULE | Freq: Two times a day (BID) | ORAL | 0 refills | Status: DC

## 2020-07-10 MED ORDER — OFLOXACIN 0.3 % OT SOLN
5.0000 [drp] | Freq: Every day | OTIC | 0 refills | Status: AC
Start: 2020-07-10 — End: ?

## 2020-07-10 NOTE — Progress Notes (Signed)
Virtual Visit via Video Note  I connected with Jo Compton on 07/10/20 at  9:00 AM EDT by a video enabled telemedicine application and verified that I am speaking with the correct person using two identifiers.  Location /participants in ov  Patient: home alone  Provider: home    I discussed the limitations of evaluation and management by telemedicine and the availability of in person appointments. The patient expressed understanding and agreed to proceed.  History of Present Illness: Pt is home c/o L ear pain -- this has occurred before and we gave her abx and drops and it got better ----- the drops have expired and ear pain is back.  No other symptoms  No fevers    Observations/Objective: There were no vitals filed for this visit. No fevers Pt is in NAD Little tenderness with pressure to ext ear   Assessment and Plan: 1. Left otitis media, unspecified otitis media type abx and gtts per orders  F/u in office if no improvement  - cefdinir (OMNICEF) 300 MG capsule; Take 1 capsule (300 mg total) by mouth 2 (two) times daily.  Dispense: 14 capsule; Refill: 0 - ofloxacin (FLOXIN OTIC) 0.3 % OTIC solution; Place 5 drops into the left ear daily.  Dispense: 5 mL; Refill: 0   Follow Up Instructions:    I discussed the assessment and treatment plan with the patient. The patient was provided an opportunity to ask questions and all were answered. The patient agreed with the plan and demonstrated an understanding of the instructions.   The patient was advised to call back or seek an in-person evaluation if the symptoms worsen or if the condition fails to improve as anticipated.    [name], DO

## 2020-07-19 ENCOUNTER — Ambulatory Visit (INDEPENDENT_AMBULATORY_CARE_PROVIDER_SITE_OTHER): Payer: 59 | Admitting: Family Medicine

## 2020-07-19 ENCOUNTER — Encounter: Payer: Self-pay | Admitting: Family Medicine

## 2020-07-19 ENCOUNTER — Other Ambulatory Visit: Payer: Self-pay

## 2020-07-19 VITALS — BP 126/82 | HR 75 | Temp 98.5°F | Resp 18 | Ht 67.0 in | Wt 221.2 lb

## 2020-07-19 DIAGNOSIS — H9202 Otalgia, left ear: Secondary | ICD-10-CM | POA: Diagnosis not present

## 2020-07-19 DIAGNOSIS — J302 Other seasonal allergic rhinitis: Secondary | ICD-10-CM

## 2020-07-19 MED ORDER — LORATADINE 10 MG PO TABS: 10.0000 mg | ORAL_TABLET | Freq: Every day | ORAL | 11 refills | Status: DC

## 2020-07-19 MED ORDER — FLUTICASONE PROPIONATE 50 MCG/ACT NA SUSP: 2.0000 | Freq: Every day | NASAL | 2 refills | Status: DC

## 2020-07-19 NOTE — Assessment & Plan Note (Signed)
Nothing on exam Use flonase and claritin daily and refer to ent

## 2020-07-19 NOTE — Progress Notes (Signed)
Subjective:    Patient ID: Jo Compton, female    DOB: 16-Nov-1965, 55 y.o.   MRN: [id number]  Chief Complaint  Patient presents with  . Ear Pain    Left ear, x 3 weeks, Pt states it feels like "wind" in her ears.     HPI Patient is in today for con't L ear pain.  She took the abx and is using the drops but it is no better.    Past Medical History:  Diagnosis Date  . Anal fissure   . Anemia   . Anxiety    no meds taken  . Arthritis   . Depression   . Fistula    rectal  . Hx of adenomatous colonic polyps 08/22/2019  . OAB (overactive bladder)   . Rectal bleeding     Past Surgical History:  Procedure Laterality Date  . ABDOMINAL HYSTERECTOMY Bilateral 03/20/2015   Procedure: HYSTERECTOMY ABDOMINAL, EXCISION MESENTERIC NODULE, BLADDER FLAP BIOPSY;  Surgeon: Ena Dawley, MD;  Location: [facility];  Service: Gynecology;  Laterality: Bilateral;  . APPENDECTOMY    . BILATERAL SALPINGECTOMY Bilateral 03/20/2015   Procedure: BILATERAL SALPINGECTOMY;  Surgeon: Ena Dawley, MD;  Location: [facility];  Service: Gynecology;  Laterality: Bilateral;  . COLONOSCOPY    . CYSTOSCOPY N/A 03/20/2015   Procedure: CYSTOSCOPY;  Surgeon: Ena Dawley, MD;  Location: [facility];  Service: Gynecology;  Laterality: N/A;  . LAPAROSCOPY N/A 03/20/2015   Procedure: LAPAROSCOPY DIAGNOSTIC;  Surgeon: Ena Dawley, MD;  Location: [facility];  Service: Gynecology;  Laterality: N/A;    Family History  Problem Relation Age of Onset  . Hypertension Mother   . COPD Mother   . Lung cancer Mother   . Colon cancer Maternal Uncle   . Liver cancer Maternal Uncle   . Hypertension Maternal Aunt   . Diabetes Maternal Aunt   . Esophageal cancer Neg Hx   . Rectal cancer Neg Hx   . Stomach cancer Neg Hx     Social History   Socioeconomic History  . Marital status: Married    Spouse name: Not on file  . Number of children: 1  . Years of education: Not on file  . Highest education level: Some college, no  degree  Occupational History  . Occupation: Land at [employer] Use  . Smoking status: Never Smoker  . Smokeless tobacco: Never Used  Vaping Use  . Vaping Use: Never used  Substance and Sexual Activity  . Alcohol use: Yes    Alcohol/week: 1.0 standard drink    Types: 1 Glasses of wine per week  . Drug use: No  . Sexual activity: Yes    Birth control/protection: None  Other Topics Concern  . Not on file  Social History Narrative   ** Merged History Encounter **       Social Determinants of Health   Financial Resource Strain: Not on file  Food Insecurity: Not on file  Transportation Needs: Not on file  Physical Activity: Not on file  Stress: Not on file  Social Connections: Not on file  Intimate Partner Violence: Not on file    Outpatient Medications Prior to Visit  Medication Sig Dispense Refill  . dilTIAZem HCl POWD APPLY RECTALLY TWICE DAILY. (Patient taking differently: APPLY RECTALLY TWICE DAILY. USES PRN) 30 g 2  . docusate sodium (COLACE) 100 MG capsule Take 1 capsule (100 mg total) by mouth 2 (two) times daily. 120 capsule 2  . famotidine (PEPCID)  20 MG tablet Take 1 tablet (20 mg total) by mouth 2 (two) times daily. 60 tablet 3  . hydrochlorothiazide (HYDRODIURIL) 25 MG tablet Take 1 tablet (25 mg total) by mouth daily. 90 tablet 1  . hydrocortisone (ANUSOL-HC) 2.5 % rectal cream Place 1 application rectally daily as needed for hemorrhoids or anal itching. 30 g 0  . ibuprofen (ADVIL) 800 MG tablet Take 1 tablet (800 mg total) by mouth every 8 (eight) hours as needed. 30 tablet 0  . meclizine (ANTIVERT) 25 MG tablet Take 1 tablet (25 mg total) by mouth 3 (three) times daily as needed for dizziness. 21 tablet 0  . ofloxacin (FLOXIN OTIC) 0.3 % OTIC solution Place 5 drops into the left ear daily. 5 mL 0  . fluticasone (FLONASE) 50 MCG/ACT nasal spray Place 2 sprays into both nostrils daily. 16 g 2  . cefdinir (OMNICEF) 300 MG capsule Take 1  capsule (300 mg total) by mouth 2 (two) times daily. (Patient not taking: Reported on 07/19/2020) 14 capsule 0   No facility-administered medications prior to visit.    No Known Allergies  Review of Systems  Constitutional: Negative for chills, fever and malaise/fatigue.  HENT: Positive for ear pain. Negative for congestion and hearing loss.   Eyes: Negative for discharge.  Respiratory: Negative for cough, sputum production and shortness of breath.   Cardiovascular: Negative for chest pain, palpitations and leg swelling.  Gastrointestinal: Negative for abdominal pain, blood in stool, constipation, diarrhea, heartburn, nausea and vomiting.  Genitourinary: Negative for dysuria, frequency, hematuria and urgency.  Musculoskeletal: Negative for back pain, falls and myalgias.  Skin: Negative for rash.  Neurological: Negative for dizziness, sensory change, loss of consciousness, weakness and headaches.  Endo/Heme/Allergies: Negative for environmental allergies. Does not bruise/bleed easily.  Psychiatric/Behavioral: Negative for depression and suicidal ideas. The patient is not nervous/anxious and does not have insomnia.        Objective:    Physical Exam Vitals and nursing note reviewed.  Constitutional:      Appearance: She is well-developed.  HENT:     Head: Normocephalic and atraumatic.     Right Ear: Tympanic membrane, ear canal and external ear normal. There is no impacted cerumen.     Left Ear: Tympanic membrane, ear canal and external ear normal. There is no impacted cerumen.     Nose: Nose normal.  Eyes:     Conjunctiva/sclera: Conjunctivae normal.  Neck:     Thyroid: No thyromegaly.     Vascular: No carotid bruit or JVD.  Cardiovascular:     Rate and Rhythm: Normal rate and regular rhythm.     Heart sounds: Normal heart sounds. No murmur heard.   Pulmonary:     Effort: Pulmonary effort is normal. No respiratory distress.     Breath sounds: Normal breath sounds. No  wheezing or rales.  Chest:     Chest wall: No tenderness.  Musculoskeletal:     Cervical back: Normal range of motion and neck supple.  Neurological:     Mental Status: She is alert and oriented to person, place, and time.     BP 126/82 (BP Location: Right Arm, Patient Position: Sitting, Cuff Size: Large)   Pulse 75   Temp 98.5 F (36.9 C) (Oral)   Resp 18   Ht 5\' 7"  (1.702 m)   Wt 221 lb 3.2 oz (100.3 kg)   LMP 02/14/2015 (Approximate)   SpO2 97%   BMI 34.64 kg/m  Wt Readings from Last 3  Encounters:  07/19/20 221 lb 3.2 oz (100.3 kg)  06/26/20 220 lb (99.8 kg)  05/20/20 220 lb 6.4 oz (100 kg)    Diabetic Foot Exam - Simple   No data filed    Lab Results  Component Value Date   WBC 5.6 05/20/2020   HGB 13.9 05/20/2020   HCT 42.8 05/20/2020   PLT 247.0 05/20/2020   GLUCOSE 84 05/20/2020   CHOL 203 (H) 05/20/2020   TRIG 88.0 05/20/2020   HDL 51.30 05/20/2020   LDLCALC 134 (H) 05/20/2020   ALT 21 05/20/2020   AST 20 05/20/2020   NA 139 05/20/2020   K 4.1 05/20/2020   CL 103 05/20/2020   CREATININE 0.83 05/20/2020   BUN 11 05/20/2020   CO2 30 05/20/2020   TSH 0.54 12/06/2018   HGBA1C 6.2 12/06/2018   MICROALBUR 0.1 06/07/2012    Lab Results  Component Value Date   TSH 0.54 12/06/2018   Lab Results  Component Value Date   WBC 5.6 05/20/2020   HGB 13.9 05/20/2020   HCT 42.8 05/20/2020   MCV 77.7 (L) 05/20/2020   PLT 247.0 05/20/2020   Lab Results  Component Value Date   NA 139 05/20/2020   K 4.1 05/20/2020   CO2 30 05/20/2020   GLUCOSE 84 05/20/2020   BUN 11 05/20/2020   CREATININE 0.83 05/20/2020   BILITOT 0.3 05/20/2020   ALKPHOS 80 05/20/2020   AST 20 05/20/2020   ALT 21 05/20/2020   PROT 7.1 05/20/2020   ALBUMIN 4.5 05/20/2020   CALCIUM 9.9 05/20/2020   ANIONGAP 10 05/18/2019   GFR 80.97 05/20/2020   Lab Results  Component Value Date   CHOL 203 (H) 05/20/2020   Lab Results  Component Value Date   HDL 51.30 05/20/2020   Lab  Results  Component Value Date   LDLCALC 134 (H) 05/20/2020   Lab Results  Component Value Date   TRIG 88.0 05/20/2020   Lab Results  Component Value Date   CHOLHDL 4 05/20/2020   Lab Results  Component Value Date   HGBA1C 6.2 12/06/2018       Assessment & Plan:   Problem List Items Addressed This Visit   None   Visit Diagnoses    Seasonal allergies    -  Primary   Relevant Medications   loratadine (CLARITIN) 10 MG tablet   fluticasone (FLONASE) 50 MCG/ACT nasal spray   loratadine (CLARITIN) 10 MG tablet   Acute otalgia, left       Relevant Medications   fluticasone (FLONASE) 50 MCG/ACT nasal spray   loratadine (CLARITIN) 10 MG tablet   Other Relevant Orders   Ambulatory referral to ENT   Viral upper respiratory tract infection       Relevant Medications   fluticasone (FLONASE) 50 MCG/ACT nasal spray   Morbid obesity ([place])       Relevant Orders   Amb Ref to Medical Weight Management      I have discontinued Amado Nash. Jefcoat's cefdinir. I am also having her start on loratadine and loratadine. Additionally, I am having her maintain her docusate sodium, ibuprofen, dilTIAZem HCl, meclizine, hydrochlorothiazide, famotidine, hydrocortisone, ofloxacin, and fluticasone.  Meds ordered this encounter  Medications  . loratadine (CLARITIN) 10 MG tablet    Sig: Take 1 tablet (10 mg total) by mouth daily.    Dispense:  30 tablet    Refill:  11  . fluticasone (FLONASE) 50 MCG/ACT nasal spray    Sig: Place 2 sprays into  both nostrils daily.    Dispense:  16 g    Refill:  2  . loratadine (CLARITIN) 10 MG tablet    Sig: Take 1 tablet (10 mg total) by mouth daily.    Dispense:  30 tablet    Refill:  [address] [place], DO

## 2020-07-19 NOTE — Patient Instructions (Signed)

## 2020-08-15 ENCOUNTER — Other Ambulatory Visit: Payer: Self-pay | Admitting: Family Medicine

## 2020-08-15 DIAGNOSIS — K219 Gastro-esophageal reflux disease without esophagitis: Secondary | ICD-10-CM

## 2020-08-21 ENCOUNTER — Ambulatory Visit: Payer: 59 | Admitting: Family Medicine

## 2020-08-29 ENCOUNTER — Ambulatory Visit (HOSPITAL_BASED_OUTPATIENT_CLINIC_OR_DEPARTMENT_OTHER): Payer: 59

## 2020-09-05 ENCOUNTER — Ambulatory Visit (HOSPITAL_BASED_OUTPATIENT_CLINIC_OR_DEPARTMENT_OTHER): Payer: 59

## 2020-09-26 ENCOUNTER — Encounter: Payer: No Typology Code available for payment source | Admitting: Family Medicine

## 2020-10-08 ENCOUNTER — Encounter (HOSPITAL_BASED_OUTPATIENT_CLINIC_OR_DEPARTMENT_OTHER): Payer: Self-pay

## 2020-10-08 ENCOUNTER — Other Ambulatory Visit: Payer: Self-pay

## 2020-10-08 ENCOUNTER — Ambulatory Visit (HOSPITAL_BASED_OUTPATIENT_CLINIC_OR_DEPARTMENT_OTHER)
Admission: RE | Admit: 2020-10-08 | Discharge: 2020-10-08 | Disposition: A | Discharge status: Home / Self Care | Payer: 59 | Source: Ambulatory Visit | Attending: Family Medicine | Admitting: Family Medicine

## 2020-10-08 DIAGNOSIS — Z1231 Encounter for screening mammogram for malignant neoplasm of breast: Secondary | ICD-10-CM | POA: Insufficient documentation

## 2020-10-14 ENCOUNTER — Other Ambulatory Visit: Payer: Self-pay

## 2020-10-14 ENCOUNTER — Ambulatory Visit (INDEPENDENT_AMBULATORY_CARE_PROVIDER_SITE_OTHER): Payer: 59 | Admitting: Family Medicine

## 2020-10-14 ENCOUNTER — Encounter: Payer: Self-pay | Admitting: Family Medicine

## 2020-10-14 VITALS — BP 120/84 | HR 77 | Temp 98.0°F | Resp 18 | Ht 67.0 in | Wt 224.8 lb

## 2020-10-14 DIAGNOSIS — R739 Hyperglycemia, unspecified: Secondary | ICD-10-CM | POA: Insufficient documentation

## 2020-10-14 DIAGNOSIS — R030 Elevated blood-pressure reading, without diagnosis of hypertension: Secondary | ICD-10-CM | POA: Diagnosis not present

## 2020-10-14 DIAGNOSIS — R600 Localized edema: Secondary | ICD-10-CM | POA: Diagnosis not present

## 2020-10-14 MED ORDER — HYDROCHLOROTHIAZIDE 25 MG PO TABS: 25.0000 mg | ORAL_TABLET | Freq: Every day | ORAL | 1 refills | Status: DC

## 2020-10-14 NOTE — Assessment & Plan Note (Signed)
hctz daily --- pt would like to try 1/2 tab

## 2020-10-14 NOTE — Assessment & Plan Note (Signed)
Refer to [facility]

## 2020-10-14 NOTE — Assessment & Plan Note (Signed)
Check labs today.

## 2020-10-14 NOTE — Patient Instructions (Signed)
https://www.nhlbi.nih.gov/files/docs/public/heart/dash_brief.pdf">  DASH Eating Plan DASH stands for Dietary Approaches to Stop Hypertension. The DASH eating plan is a healthy eating plan that has been shown to: Reduce high blood pressure (hypertension). Reduce your risk for type 2 diabetes, heart disease, and stroke. Help with weight loss. What are tips for following this plan? Reading food labels Check food labels for the amount of salt (sodium) per serving. Choose foods with less than 5 percent of the Daily Value of sodium. Generally, foods with less than 300 milligrams (mg) of sodium per serving fit into this eating plan. To find whole grains, look for the word "whole" as the first word in the ingredient list. Shopping Buy products labeled as "low-sodium" or "no salt added." Buy fresh foods. Avoid canned foods and pre-made or frozen meals. Cooking Avoid adding salt when cooking. Use salt-free seasonings or herbs instead of table salt or sea salt. Check with your health care provider or pharmacist before using salt substitutes. Do not fry foods. Cook foods using healthy methods such as baking, boiling, grilling, roasting, and broiling instead. Cook with heart-healthy oils, such as olive, canola, avocado, soybean, or sunflower oil. Meal planning  Eat a balanced diet that includes: 4 or more servings of fruits and 4 or more servings of vegetables each day. Try to fill one-half of your plate with fruits and vegetables. 6-8 servings of whole grains each day. Less than 6 oz (170 g) of lean meat, poultry, or fish each day. A 3-oz (85-g) serving of meat is about the same size as a deck of cards. One egg equals 1 oz (28 g). 2-3 servings of low-fat dairy each day. One serving is 1 cup (237 mL). 1 serving of nuts, seeds, or beans 5 times each week. 2-3 servings of heart-healthy fats. Healthy fats called omega-3 fatty acids are found in foods such as walnuts, flaxseeds, fortified milks, and eggs.  These fats are also found in cold-water fish, such as sardines, salmon, and mackerel. Limit how much you eat of: Canned or prepackaged foods. Food that is high in trans fat, such as some fried foods. Food that is high in saturated fat, such as fatty meat. Desserts and other sweets, sugary drinks, and other foods with added sugar. Full-fat dairy products. Do not salt foods before eating. Do not eat more than 4 egg yolks a week. Try to eat at least 2 vegetarian meals a week. Eat more home-cooked food and less restaurant, buffet, and fast food.  Lifestyle When eating at a restaurant, ask that your food be prepared with less salt or no salt, if possible. If you drink alcohol: Limit how much you use to: 0-1 drink a day for women who are not pregnant. 0-2 drinks a day for men. Be aware of how much alcohol is in your drink. In the [place] one drink equals one 12 oz bottle of beer (355 mL), one 5 oz glass of wine (148 mL), or one 1 oz glass of hard liquor (44 mL). General information Avoid eating more than 2,300 mg of salt a day. If you have hypertension, you may need to reduce your sodium intake to 1,500 mg a day. Work with your health care provider to maintain a healthy body weight or to lose weight. Ask what an ideal weight is for you. Get at least 30 minutes of exercise that causes your heart to beat faster (aerobic exercise) most days of the week. Activities may include walking, swimming, or biking. Work with your health care provider   or dietitian to adjust your eating plan to your individual calorie needs. What foods should I eat? Fruits All fresh, dried, or frozen fruit. Canned fruit in natural juice (without addedsugar). Vegetables Fresh or frozen vegetables (raw, steamed, roasted, or grilled). Low-sodium or reduced-sodium tomato and vegetable juice. Low-sodium or reduced-sodium tomatosauce and tomato paste. Low-sodium or reduced-sodium canned vegetables. Grains Whole-grain or  whole-wheat bread. Whole-grain or whole-wheat pasta. Brown rice. Oatmeal. Quinoa. Bulgur. Whole-grain and low-sodium cereals. Pita bread.Low-fat, low-sodium crackers. Whole-wheat flour tortillas. Meats and other proteins Skinless chicken or turkey. Ground chicken or turkey. Pork with fat trimmed off. Fish and seafood. Egg whites. Dried beans, peas, or lentils. Unsalted nuts, nut butters, and seeds. Unsalted canned beans. Lean cuts of beef with fat trimmed off. Low-sodium, lean precooked or cured meat, such as sausages or meatloaves. Dairy Low-fat (1%) or fat-free (skim) milk. Reduced-fat, low-fat, or fat-free cheeses. Nonfat, low-sodium ricotta or cottage cheese. Low-fat or nonfatyogurt. Low-fat, low-sodium cheese. Fats and oils Soft margarine without trans fats. Vegetable oil. Reduced-fat, low-fat, or light mayonnaise and salad dressings (reduced-sodium). Canola, safflower, olive, avocado, soybean, andsunflower oils. Avocado. Seasonings and condiments Herbs. Spices. Seasoning mixes without salt. Other foods Unsalted popcorn and pretzels. Fat-free sweets. The items listed above may not be a complete list of foods and beverages you can eat. Contact a dietitian for more information. What foods should I avoid? Fruits Canned fruit in a light or heavy syrup. Fried fruit. Fruit in cream or buttersauce. Vegetables Creamed or fried vegetables. Vegetables in a cheese sauce. Regular canned vegetables (not low-sodium or reduced-sodium). Regular canned tomato sauce and paste (not low-sodium or reduced-sodium). Regular tomato and vegetable juice(not low-sodium or reduced-sodium). Pickles. Olives. Grains Baked goods made with fat, such as croissants, muffins, or some breads. Drypasta or rice meal packs. Meats and other proteins Fatty cuts of meat. Ribs. Fried meat. Bacon. Bologna, salami, and other precooked or cured meats, such as sausages or meat loaves. Fat from the back of a pig (fatback). Bratwurst.  Salted nuts and seeds. Canned beans with added salt. Canned orsmoked fish. Whole eggs or egg yolks. Chicken or turkey with skin. Dairy Whole or 2% milk, cream, and half-and-half. Whole or full-fat cream cheese. Whole-fat or sweetened yogurt. Full-fat cheese. Nondairy creamers. Whippedtoppings. Processed cheese and cheese spreads. Fats and oils Butter. Stick margarine. Lard. Shortening. Ghee. Bacon fat. Tropical oils, suchas coconut, palm kernel, or palm oil. Seasonings and condiments Onion salt, garlic salt, seasoned salt, table salt, and sea salt. Worcestershire sauce. Tartar sauce. Barbecue sauce. Teriyaki sauce. Soy sauce, including reduced-sodium. Steak sauce. Canned and packaged gravies. Fish sauce. Oyster sauce. Cocktail sauce. Store-bought horseradish. Ketchup. Mustard. Meat flavorings and tenderizers. Bouillon cubes. Hot sauces. Pre-made or packaged marinades. Pre-made or packaged taco seasonings. Relishes. Regular saladdressings. Other foods Salted popcorn and pretzels. The items listed above may not be a complete list of foods and beverages you should avoid. Contact a dietitian for more information. Where to find more information National Heart, Lung, and Blood Institute: www.nhlbi.nih.gov American Heart Association: www.heart.org Academy of Nutrition and Dietetics: www.eatright.org National Kidney Foundation: www.kidney.org Summary The DASH eating plan is a healthy eating plan that has been shown to reduce high blood pressure (hypertension). It may also reduce your risk for type 2 diabetes, heart disease, and stroke. When on the DASH eating plan, aim to eat more fresh fruits and vegetables, whole grains, lean proteins, low-fat dairy, and heart-healthy fats. With the DASH eating plan, you should limit salt (sodium) intake to 2,300   mg a day. If you have hypertension, you may need to reduce your sodium intake to 1,500 mg a day. Work with your health care provider or dietitian to adjust  your eating plan to your individual calorie needs. This information is not intended to replace advice given to you by your health care provider. Make sure you discuss any questions you have with your healthcare provider. Document Revised: 03/17/2019 Document Reviewed: 03/17/2019 Elsevier Patient Education  2022 Elsevier Inc.  

## 2020-10-14 NOTE — Assessment & Plan Note (Signed)
bp good in office Dash diet Well controlled, no changes to meds. Encouraged heart healthy diet such as the DASH diet and exercise as tolerated.

## 2020-10-14 NOTE — Progress Notes (Signed)
Patient ID: Jo Compton, female    DOB: 1966/02/04  Age: 55 y.o. MRN: [id number]    Subjective:  Subjective  HPI AMSI GRIMLEY presents for an office visit and f/u for BP today. She complains of elevated BP while at home and at work. However at the office today her BP is within the normal range. She endorses taking 25 mg of hydrochlorothiazide PO Daily for her dx of LE edema, had result in her being the restroom.  She reports that she works 3rd shift.  BP Readings from Last 3 Encounters:  10/14/20 120/84  07/19/20 126/82  06/26/20 133/86  She notes that she has been taking organic apple cider vinegar. She reports that she has been eating more sugar than normal and had gained weight. Pt is worried that she might have diabetes. She wishes to attend health and wellness but has scheduling difficulties.  Lab Results  Component Value Date   HGBA1C 6.2 12/06/2018   Wt Readings from Last 3 Encounters:  10/14/20 224 lb 12.8 oz (102 kg)  07/19/20 221 lb 3.2 oz (100.3 kg)  06/26/20 220 lb (99.8 kg)  She denies any chest pain, SOB, fever, abdominal pain, cough, chills, sore throat, dysuria, urinary incontinence, back pain, HA, or N/V/D at this time.   Review of Systems  Constitutional:  Negative for chills, fatigue and fever.  HENT:  Negative for ear pain, rhinorrhea, sinus pressure, sinus pain, sore throat and tinnitus.   Eyes:  Negative for pain.  Respiratory:  Negative for cough, shortness of breath and wheezing.   Cardiovascular:  Negative for chest pain.       (+) elevated BP    Gastrointestinal:  Negative for abdominal pain, anal bleeding, constipation, diarrhea, nausea and vomiting.  Genitourinary:  Negative for flank pain.  Musculoskeletal:  Negative for back pain and neck pain.  Skin:  Negative for rash.  Neurological:  Negative for seizures, weakness, light-headedness, numbness and headaches.   History Past Medical History:  Diagnosis Date   Anal fissure    Anemia    Anxiety     no meds taken   Arthritis    Depression    Fistula    rectal   Hx of adenomatous colonic polyps 08/22/2019   OAB (overactive bladder)    Rectal bleeding     She has a past surgical history that includes Appendectomy; Colonoscopy; Abdominal hysterectomy (Bilateral, 03/20/2015); laparoscopy (N/A, 03/20/2015); Bilateral salpingectomy (Bilateral, 03/20/2015); and Cystoscopy (N/A, 03/20/2015).   Her family history includes COPD in her mother; Colon cancer in her maternal uncle; Diabetes in her maternal aunt; Hypertension in her maternal aunt and mother; Liver cancer in her maternal uncle; Lung cancer in her mother.She reports that she has never smoked. She has never used smokeless tobacco. She reports current alcohol use of about 1.0 standard drink of alcohol per week. She reports that she does not use drugs.  Current Outpatient Medications on File Prior to Visit  Medication Sig Dispense Refill   dilTIAZem HCl POWD APPLY RECTALLY TWICE DAILY. (Patient taking differently: APPLY RECTALLY TWICE DAILY. USES PRN) 30 g 2   docusate sodium (COLACE) 100 MG capsule Take 1 capsule (100 mg total) by mouth 2 (two) times daily. 120 capsule 2   famotidine (PEPCID) 20 MG tablet TAKE 1 TABLET BY MOUTH TWICE A DAY 180 tablet 0   fluticasone (FLONASE) 50 MCG/ACT nasal spray Place 2 sprays into both nostrils daily. 16 g 2   hydrocortisone (ANUSOL-HC) 2.5 % rectal cream  Place 1 application rectally daily as needed for hemorrhoids or anal itching. 30 g 0   ibuprofen (ADVIL) 800 MG tablet Take 1 tablet (800 mg total) by mouth every 8 (eight) hours as needed. 30 tablet 0   loratadine (CLARITIN) 10 MG tablet Take 1 tablet (10 mg total) by mouth daily. 30 tablet 11   loratadine (CLARITIN) 10 MG tablet Take 1 tablet (10 mg total) by mouth daily. 30 tablet 11   ofloxacin (FLOXIN OTIC) 0.3 % OTIC solution Place 5 drops into the left ear daily. 5 mL 0   meclizine (ANTIVERT) 25 MG tablet Take 1 tablet (25 mg total) by  mouth 3 (three) times daily as needed for dizziness. (Patient not taking: Reported on 10/14/2020) 21 tablet 0   No current facility-administered medications on file prior to visit.     Objective:  Objective  Physical Exam Vitals and nursing note reviewed.  Constitutional:      General: She is not in acute distress.    Appearance: Normal appearance. She is well-developed. She is not ill-appearing.  HENT:     Head: Normocephalic and atraumatic.     Right Ear: External ear normal.     Left Ear: External ear normal.     Nose: Nose normal.  Eyes:     General:        Right eye: No discharge.        Left eye: No discharge.     Extraocular Movements: Extraocular movements intact.     Pupils: Pupils are equal, round, and reactive to light.  Cardiovascular:     Rate and Rhythm: Normal rate and regular rhythm.     Pulses: Normal pulses.     Heart sounds: Normal heart sounds. No murmur heard.   No friction rub. No gallop.  Pulmonary:     Effort: Pulmonary effort is normal. No respiratory distress.     Breath sounds: Normal breath sounds. No stridor. No wheezing, rhonchi or rales.  Chest:     Chest wall: No tenderness.  Abdominal:     General: Bowel sounds are normal. There is no distension.     Palpations: Abdomen is soft. There is no mass.     Tenderness: There is no abdominal tenderness. There is no guarding or rebound.     Hernia: No hernia is present.  Musculoskeletal:        General: Normal range of motion.     Cervical back: Normal range of motion and neck supple.     Right lower leg: No edema.     Left lower leg: No edema.  Skin:    General: Skin is warm and dry.  Neurological:     Mental Status: She is alert and oriented to person, place, and time.  Psychiatric:        Behavior: Behavior normal.        Thought Content: Thought content normal.   BP 120/84   Pulse 77   Temp 98 F (36.7 C) (Oral)   Resp 18   Ht 5\' 7"  (1.702 m)   Wt 224 lb 12.8 oz (102 kg)   LMP  02/14/2015 (Approximate)   SpO2 96%   BMI 35.21 kg/m  Wt Readings from Last 3 Encounters:  10/14/20 224 lb 12.8 oz (102 kg)  07/19/20 221 lb 3.2 oz (100.3 kg)  06/26/20 220 lb (99.8 kg)     Lab Results  Component Value Date   WBC 5.6 05/20/2020   HGB 13.9 05/20/2020  HCT 42.8 05/20/2020   PLT 247.0 05/20/2020   GLUCOSE 84 05/20/2020   CHOL 203 (H) 05/20/2020   TRIG 88.0 05/20/2020   HDL 51.30 05/20/2020   LDLCALC 134 (H) 05/20/2020   ALT 21 05/20/2020   AST 20 05/20/2020   NA 139 05/20/2020   K 4.1 05/20/2020   CL 103 05/20/2020   CREATININE 0.83 05/20/2020   BUN 11 05/20/2020   CO2 30 05/20/2020   TSH 0.54 12/06/2018   HGBA1C 6.2 12/06/2018   MICROALBUR 0.1 06/07/2012    MM 3D SCREEN BREAST BILATERAL  Result Date: 10/10/2020 CLINICAL DATA:  Screening. EXAM: DIGITAL SCREENING BILATERAL MAMMOGRAM WITH TOMOSYNTHESIS AND CAD TECHNIQUE: Bilateral screening digital craniocaudal and mediolateral oblique mammograms were obtained. Bilateral screening digital breast tomosynthesis was performed. The images were evaluated with computer-aided detection. COMPARISON:  Previous exam(s). ACR Breast Density Category a: The breast tissue is almost entirely fatty. FINDINGS: There are no findings suspicious for malignancy. The images were evaluated with computer-aided detection. IMPRESSION: No mammographic evidence of malignancy. A result letter of this screening mammogram will be mailed directly to the patient. RECOMMENDATION: Screening mammogram in one year. (Code:SM-B-01Y) BI-RADS CATEGORY  1: Negative. Electronically Signed   By: Audie Pinto M.D.   On: 10/10/2020 10:02     Assessment & Plan:  Plan   Meds ordered this encounter  Medications   hydrochlorothiazide (HYDRODIURIL) 25 MG tablet    Sig: Take 1 tablet (25 mg total) by mouth daily.    Dispense:  90 tablet    Refill:  1    Problem List Items Addressed This Visit       Unprioritized   Morbid obesity ([place])    Relevant Orders   Amb Ref to Medical Weight Management   Lipid panel   Comprehensive metabolic panel   Hemoglobin A1c   Other Visit Diagnoses     Hyperglycemia    -  Primary   Relevant Orders   Hemoglobin A1c   Lipid panel   TSH   Comprehensive metabolic panel   Lipid panel   Comprehensive metabolic panel   Hemoglobin A1c   Lower extremity edema       Relevant Medications   hydrochlorothiazide (HYDRODIURIL) 25 MG tablet   Essential hypertension       Relevant Medications   hydrochlorothiazide (HYDRODIURIL) 25 MG tablet       Follow-up: Return in about 3 months (around 01/14/2021), or if symptoms worsen or fail to improve, for weight , bp.   I,Gordon Zheng,acting as a Education administrator for [employer], DO.,have documented all relevant documentation on the behalf of [name], DO,as directed by  [name], DO while in the presence of [place], DO, have reviewed all documentation for this visit. The documentation on 10/14/20 for the exam, diagnosis, procedures, and orders are all accurate and complete. a

## 2020-10-15 LAB — COMPREHENSIVE METABOLIC PANEL
ALT: 23 U/L (ref 0–35)
AST: 19 U/L (ref 0–37)
Albumin: 4.1 g/dL (ref 3.5–5.2)
Alkaline Phosphatase: 63 U/L (ref 39–117)
BUN: 12 mg/dL (ref 6–23)
CO2: 27 mEq/L (ref 19–32)
Calcium: 9.8 mg/dL (ref 8.4–10.5)
Chloride: 106 mEq/L (ref 96–112)
Creatinine, Ser: 0.8 mg/dL (ref 0.40–1.20)
GFR: 84.39 mL/min (ref >60.00)
Glucose, Bld: 110 mg/dL — ABNORMAL HIGH (ref 70–99)
Potassium: 4 mEq/L (ref 3.5–5.1)
Sodium: 140 mEq/L (ref 135–145)
Total Bilirubin: 0.4 mg/dL (ref 0.2–1.2)
Total Protein: 6.7 g/dL (ref 6.0–8.3)

## 2020-10-15 LAB — LIPID PANEL
Cholesterol: 213 mg/dL — ABNORMAL HIGH (ref 0–200)
HDL: 47.1 mg/dL (ref >39.00)
LDL Cholesterol: 129 mg/dL — ABNORMAL HIGH (ref 0–99)
NonHDL: 166.21
Total CHOL/HDL Ratio: 5
Triglycerides: 188 mg/dL — ABNORMAL HIGH (ref 0.0–149.0)
VLDL: 37.6 mg/dL (ref 0.0–40.0)

## 2020-10-15 LAB — TSH: TSH: 0.79 u[IU]/mL (ref 0.35–4.50)

## 2020-10-15 LAB — HEMOGLOBIN A1C: Hgb A1c MFr Bld: 6.1 % (ref 4.6–6.5)

## 2020-10-16 ENCOUNTER — Other Ambulatory Visit: Payer: Self-pay

## 2020-10-16 ENCOUNTER — Other Ambulatory Visit: Payer: Self-pay | Admitting: Family Medicine

## 2020-10-16 DIAGNOSIS — E785 Hyperlipidemia, unspecified: Secondary | ICD-10-CM

## 2020-10-16 DIAGNOSIS — R739 Hyperglycemia, unspecified: Secondary | ICD-10-CM

## 2020-10-16 MED ORDER — ROSUVASTATIN CALCIUM 10 MG PO TABS: 10.0000 mg | ORAL_TABLET | Freq: Every day | ORAL | 2 refills | Status: DC

## 2020-10-18 ENCOUNTER — Other Ambulatory Visit: Payer: Self-pay | Admitting: Family Medicine

## 2020-10-18 DIAGNOSIS — H9202 Otalgia, left ear: Secondary | ICD-10-CM

## 2020-10-18 DIAGNOSIS — J302 Other seasonal allergic rhinitis: Secondary | ICD-10-CM

## 2021-01-07 ENCOUNTER — Ambulatory Visit: Payer: 59 | Admitting: Family Medicine

## 2021-01-11 ENCOUNTER — Other Ambulatory Visit: Payer: Self-pay | Admitting: Family Medicine

## 2021-02-02 ENCOUNTER — Other Ambulatory Visit: Payer: Self-pay | Admitting: Family Medicine

## 2021-02-02 DIAGNOSIS — R600 Localized edema: Secondary | ICD-10-CM

## 2021-02-02 DIAGNOSIS — R030 Elevated blood-pressure reading, without diagnosis of hypertension: Secondary | ICD-10-CM

## 2021-03-10 ENCOUNTER — Other Ambulatory Visit: Payer: Self-pay

## 2021-03-10 ENCOUNTER — Encounter: Payer: Self-pay | Admitting: Family Medicine

## 2021-03-10 ENCOUNTER — Ambulatory Visit (INDEPENDENT_AMBULATORY_CARE_PROVIDER_SITE_OTHER): Payer: 59 | Admitting: Family Medicine

## 2021-03-10 VITALS — BP 128/78 | HR 73 | Temp 98.0°F | Resp 18 | Ht 67.0 in | Wt 223.6 lb

## 2021-03-10 DIAGNOSIS — J302 Other seasonal allergic rhinitis: Secondary | ICD-10-CM

## 2021-03-10 DIAGNOSIS — K644 Residual hemorrhoidal skin tags: Secondary | ICD-10-CM | POA: Insufficient documentation

## 2021-03-10 DIAGNOSIS — K602 Anal fissure, unspecified: Secondary | ICD-10-CM

## 2021-03-10 DIAGNOSIS — H9202 Otalgia, left ear: Secondary | ICD-10-CM | POA: Diagnosis not present

## 2021-03-10 MED ORDER — FLUTICASONE PROPIONATE 50 MCG/ACT NA SUSP: NASAL | 2 refills | Status: DC

## 2021-03-10 MED ORDER — HYDROCORTISONE (PERIANAL) 2.5 % EX CREA: 1.0000 "application " | TOPICAL_CREAM | Freq: Every day | CUTANEOUS | 0 refills | Status: DC | PRN

## 2021-03-10 MED ORDER — DILTIAZEM HCL POWD: 2 refills | Status: DC

## 2021-03-10 NOTE — Patient Instructions (Signed)
Anal Fissure, Adult An anal fissure is a small tear or crack in the tissue of the anus. Bleeding from a fissure usually stops on its own within a few minutes. However, bleeding will often occur again with each bowel movement until the fissure heals. What are the causes? This condition is usually caused by passing a large or hard stool (feces). Other causes include: Constipation. Frequent diarrhea. Inflammatory bowel disease (Crohn's disease or ulcerative colitis). Childbirth. Infections. Anal sex. What are the signs or symptoms? Symptoms of this condition include: Bleeding from the rectum. Small amounts of blood seen on your stool, on the toilet paper, or in the toilet after a bowel movement. The blood coats the outside of the stool and is not mixed with the stool. Painful bowel movements. Itching or irritation around the anus. How is this diagnosed? A health care provider may diagnose this condition by closely examining the anal area. An anal fissure can usually be seen with careful inspection. In some cases, a rectal exam may be performed, or a short tube (anoscope) may be used to examine the anal canal. How is this treated? Initial treatment for this condition may include: Taking steps to avoid constipation. This may include making changes to your diet, such as increasing your intake of fiber or fluid. Taking fiber supplements. These supplements can soften your stool to help make bowel movements easier. Your health care provider may also prescribe a stool softener if your stool is hard. Taking sitz baths. This may help to heal the tear. Using medicated creams or ointments. These may be prescribed to lessen discomfort. Treatments that are sometimes used if initial treatments do not work well or if the condition is more severe may include: Botulinum injection. Surgery to repair the fissure. Follow these instructions at home: Eating and drinking  Avoid foods that may cause constipation,  such as bananas, milk, and other dairy products. Eat all fruits, except bananas. Drink enough fluid to keep your urine pale yellow. Eat foods that are high in fiber, such as beans, whole grains, and fresh fruits and vegetables. General instructions  Take over-the-counter and prescription medicines only as told by your health care provider. Use creams or ointments only as told by your health care provider. Keep the anal area clean and dry. Take sitz baths as told by your health care provider. Do not use soap in the sitz baths. Keep all follow-up visits as told by your health care provider. This is important. Contact a health care provider if you have: More bleeding. A fever. Diarrhea that is mixed with blood. Pain that continues. Ongoing problems that are getting worse rather than better. Summary An anal fissure is a small tear or crack in the tissue of the anus. This condition is usually caused by passing a large or hard stool (feces). Other causes include constipation and frequent diarrhea. Initial treatment for this condition may include taking steps to avoid constipation, such as increasing your intake of fiber or fluid. Follow instructions for care as told by your health care provider. Contact your health care provider if you have more bleeding or your problem is getting worse rather than better. Keep all follow-up visits as told by your health care provider. This is important. This information is not intended to replace advice given to you by your health care provider. Make sure you discuss any questions you have with your health care provider. Document Revised: 11/07/2020 Document Reviewed: 11/07/2020 Elsevier Patient Education  [place].

## 2021-03-10 NOTE — Assessment & Plan Note (Signed)
Sitz baths  Refill anusol F/u GI

## 2021-03-10 NOTE — Progress Notes (Signed)
Subjective:   By signing my name below, I, Jo Compton, attest that this documentation has been prepared under the direction and in the presence of [name], DO. 03/10/2021    Patient ID: Jo Compton, female    DOB: 12-17-1965, 55 y.o.   MRN: [id number]  Chief Complaint  Patient presents with   Rectum Burning    X1 week, Pt states pain with sitting. Pt states no bleeding, no diarrhea.     HPI Patient is in today for an office visit.  She reports she has been having pain and burning in her rectum and around the edges. She cannot sit for long periods of time. She has a history of anal fissure and hemorrhoids. She mentions she recently had anal intercourse with her partner and might not have have prepped properly. At her last colonoscopy, she was told they did not need to take out the hemorrhoids since it did not look serious. She tried to go back to where she had her colonoscopy but they did not have any close appointments. She also adds that she occasionally sees blood in her stool which may be due to her bouts of constipation.  She denies diarrhea.  Past Medical History:  Diagnosis Date   Anal fissure    Anemia    Anxiety    no meds taken   Arthritis    Depression    Fistula    rectal   Hx of adenomatous colonic polyps 08/22/2019   OAB (overactive bladder)    Rectal bleeding     Past Surgical History:  Procedure Laterality Date   ABDOMINAL HYSTERECTOMY Bilateral 03/20/2015   Procedure: HYSTERECTOMY ABDOMINAL, EXCISION MESENTERIC NODULE, BLADDER FLAP BIOPSY;  Surgeon: Ena Dawley, MD;  Location: [facility];  Service: Gynecology;  Laterality: Bilateral;   APPENDECTOMY     BILATERAL SALPINGECTOMY Bilateral 03/20/2015   Procedure: BILATERAL SALPINGECTOMY;  Surgeon: Ena Dawley, MD;  Location: [facility];  Service: Gynecology;  Laterality: Bilateral;   COLONOSCOPY     CYSTOSCOPY N/A 03/20/2015   Procedure: CYSTOSCOPY;  Surgeon: Ena Dawley, MD;  Location: [facility];  Service: Gynecology;  Laterality: N/A;   LAPAROSCOPY N/A 03/20/2015   Procedure: LAPAROSCOPY DIAGNOSTIC;  Surgeon: Ena Dawley, MD;  Location: [facility];  Service: Gynecology;  Laterality: N/A;    Family History  Problem Relation Age of Onset   Hypertension Mother    COPD Mother    Lung cancer Mother    Colon cancer Maternal Uncle    Liver cancer Maternal Uncle    Hypertension Maternal Aunt    Diabetes Maternal Aunt    Esophageal cancer Neg Hx    Rectal cancer Neg Hx    Stomach cancer Neg Hx     Social History   Socioeconomic History   Marital status: Married    Spouse name: Not on file   Number of children: 1   Years of education: Not on file   Highest education level: Some college, no degree  Occupational History   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  Tobacco Use   Smoking status: Never   Smokeless tobacco: Never  Vaping Use   Vaping Use: Never used  Substance and Sexual Activity   Alcohol use: Yes    Alcohol/week: 1.0 standard drink    Types: 1 Glasses of wine per week   Drug use: No   Sexual activity: Yes    Birth control/protection: None  Other Topics Concern   Not on file  Social History Narrative   ** Merged History Encounter **       Social Determinants of Health   Financial Resource Strain: Not on file  Food Insecurity: Not on file  Transportation Needs: Not on file  Physical Activity: Not on file  Stress: Not on file  Social Connections: Not on file  Intimate Partner Violence: Not on file    Outpatient Medications Prior to Visit  Medication Sig Dispense Refill   docusate sodium (COLACE) 100 MG capsule Take 1 capsule (100 mg total) by mouth 2 (two) times daily. 120 capsule 2   famotidine (PEPCID) 20 MG tablet TAKE 1 TABLET BY MOUTH TWICE A DAY 180 tablet 0   hydrochlorothiazide (HYDRODIURIL) 25 MG tablet TAKE 1 TABLET (25 MG TOTAL) BY MOUTH DAILY. 90 tablet 1   ibuprofen (ADVIL) 800 MG tablet Take 1 tablet (800 mg total) by  mouth every 8 (eight) hours as needed. 30 tablet 0   loratadine (CLARITIN) 10 MG tablet Take 1 tablet (10 mg total) by mouth daily. 30 tablet 11   loratadine (CLARITIN) 10 MG tablet Take 1 tablet (10 mg total) by mouth daily. 30 tablet 11   ofloxacin (FLOXIN OTIC) 0.3 % OTIC solution Place 5 drops into the left ear daily. 5 mL 0   rosuvastatin (CRESTOR) 10 MG tablet TAKE 1 TABLET BY MOUTH EVERY DAY 30 tablet 0   dilTIAZem HCl POWD APPLY RECTALLY TWICE DAILY. (Patient taking differently: APPLY RECTALLY TWICE DAILY. USES PRN) 30 g 2   fluticasone (FLONASE) 50 MCG/ACT nasal spray SPRAY 2 SPRAYS INTO EACH NOSTRIL EVERY DAY 48 mL 2   hydrocortisone (ANUSOL-HC) 2.5 % rectal cream Place 1 application rectally daily as needed for hemorrhoids or anal itching. 30 g 0   No facility-administered medications prior to visit.    No Known Allergies  Review of Systems  Constitutional:  Negative for fever.  HENT:  Negative for congestion, ear pain, hearing loss, sinus pain and sore throat.   Eyes:  Negative for blurred vision and pain.  Respiratory:  Negative for cough, sputum production, shortness of breath and wheezing.   Cardiovascular:  Negative for chest pain and palpitations.  Gastrointestinal:  Negative for blood in stool, constipation, diarrhea, nausea and vomiting.  Genitourinary:  Negative for dysuria, frequency, hematuria and urgency.       (+) paining and burning in rectum   Musculoskeletal:  Negative for back pain, falls and myalgias.  Neurological:  Negative for dizziness, sensory change, loss of consciousness, weakness and headaches.  Endo/Heme/Allergies:  Negative for environmental allergies. Does not bruise/bleed easily.  Psychiatric/Behavioral:  Negative for depression and suicidal ideas. The patient is not nervous/anxious and does not have insomnia.       Objective:    Physical Exam Constitutional:      General: She is not in acute distress.    Appearance: Normal appearance. She  is not ill-appearing.  HENT:     Head: Normocephalic and atraumatic.     Right Ear: External ear normal.     Left Ear: External ear normal.  Eyes:     Extraocular Movements: Extraocular movements intact.     Pupils: Pupils are equal, round, and reactive to light.  Cardiovascular:     Rate and Rhythm: Normal rate and regular rhythm.     Pulses: Normal pulses.     Heart sounds: Normal heart sounds. No murmur heard.   No gallop.  Pulmonary:     Effort: Pulmonary effort is normal. No respiratory  distress.     Breath sounds: Normal breath sounds. No wheezing, rhonchi or rales.  Abdominal:     General: Bowel sounds are normal. There is no distension.     Palpations: Abdomen is soft. There is no mass.     Tenderness: There is no abdominal tenderness. There is no guarding or rebound.     Hernia: No hernia is present.  Genitourinary:    Rectum: External hemorrhoid present.     Comments: Small external hemorrhoids, non-tender on palpation,no blood in stool  Musculoskeletal:     Cervical back: Normal range of motion and neck supple.  Lymphadenopathy:     Cervical: No cervical adenopathy.  Skin:    General: Skin is warm and dry.  Neurological:     Mental Status: She is alert and oriented to person, place, and time.  Psychiatric:        Behavior: Behavior normal.    BP 128/78 (BP Location: Left Arm, Patient Position: Sitting, Cuff Size: Large)   Pulse 73   Temp 98 F (36.7 C) (Oral)   Resp 18   Ht 5\' 7"  (1.702 m)   Wt 223 lb 9.6 oz (101.4 kg)   LMP 02/14/2015 (Approximate)   SpO2 98%   BMI 35.02 kg/m  Wt Readings from Last 3 Encounters:  03/10/21 223 lb 9.6 oz (101.4 kg)  10/14/20 224 lb 12.8 oz (102 kg)  07/19/20 221 lb 3.2 oz (100.3 kg)    Diabetic Foot Exam - Simple   No data filed    Lab Results  Component Value Date   WBC 5.6 05/20/2020   HGB 13.9 05/20/2020   HCT 42.8 05/20/2020   PLT 247.0 05/20/2020   GLUCOSE 110 (H) 10/14/2020   CHOL 213 (H) 10/14/2020    TRIG 188.0 (H) 10/14/2020   HDL 47.10 10/14/2020   LDLCALC 129 (H) 10/14/2020   ALT 23 10/14/2020   AST 19 10/14/2020   NA 140 10/14/2020   K 4.0 10/14/2020   CL 106 10/14/2020   CREATININE 0.80 10/14/2020   BUN 12 10/14/2020   CO2 27 10/14/2020   TSH 0.79 10/14/2020   HGBA1C 6.1 10/14/2020   MICROALBUR 0.1 06/07/2012    Lab Results  Component Value Date   TSH 0.79 10/14/2020   Lab Results  Component Value Date   WBC 5.6 05/20/2020   HGB 13.9 05/20/2020   HCT 42.8 05/20/2020   MCV 77.7 (L) 05/20/2020   PLT 247.0 05/20/2020   Lab Results  Component Value Date   NA 140 10/14/2020   K 4.0 10/14/2020   CO2 27 10/14/2020   GLUCOSE 110 (H) 10/14/2020   BUN 12 10/14/2020   CREATININE 0.80 10/14/2020   BILITOT 0.4 10/14/2020   ALKPHOS 63 10/14/2020   AST 19 10/14/2020   ALT 23 10/14/2020   PROT 6.7 10/14/2020   ALBUMIN 4.1 10/14/2020   CALCIUM 9.8 10/14/2020   ANIONGAP 10 05/18/2019   GFR 84.39 10/14/2020   Lab Results  Component Value Date   CHOL 213 (H) 10/14/2020   Lab Results  Component Value Date   HDL 47.10 10/14/2020   Lab Results  Component Value Date   LDLCALC 129 (H) 10/14/2020   Lab Results  Component Value Date   TRIG 188.0 (H) 10/14/2020   Lab Results  Component Value Date   CHOLHDL 5 10/14/2020   Lab Results  Component Value Date   HGBA1C 6.1 10/14/2020       Assessment & Plan:   Problem  List Items Addressed This Visit       Unprioritized   Acute otalgia, left   Relevant Medications   fluticasone (FLONASE) 50 MCG/ACT nasal spray   Seasonal allergies   Relevant Medications   fluticasone (FLONASE) 50 MCG/ACT nasal spray   Anal fissure - Primary    Refill diltiazem F/u GI      Relevant Medications   dilTIAZem HCl POWD   External hemorrhoid    Sitz baths  Refill anusol F/u GI       Relevant Medications   hydrocortisone (ANUSOL-HC) 2.5 % rectal cream    Meds ordered this encounter  Medications   fluticasone  (FLONASE) 50 MCG/ACT nasal spray    Sig: SPRAY 2 SPRAYS INTO EACH NOSTRIL EVERY DAY    Dispense:  48 mL    Refill:  2   dilTIAZem HCl POWD    Sig: APPLY RECTALLY TWICE DAILY.    Dispense:  30 g    Refill:  2   hydrocortisone (ANUSOL-HC) 2.5 % rectal cream    Sig: Place 1 application rectally daily as needed for hemorrhoids or anal itching.    Dispense:  30 g    Refill:  0    I,Jo Compton,acting as a scribe for [employer], DO.,have documented all relevant documentation on the behalf of [name], DO,as directed by  [name], DO while in the presence of [name], DO.   I, [name], DO., personally preformed the services described in this documentation.  All medical record entries made by the scribe were at my direction and in my presence.  I have reviewed the chart and discharge instructions (if applicable) and agree that the record reflects my personal performance and is accurate and complete. 03/10/2021

## 2021-03-10 NOTE — Assessment & Plan Note (Signed)
Refill diltiazem F/u GI

## 2021-03-19 ENCOUNTER — Other Ambulatory Visit: Payer: Self-pay

## 2021-03-19 DIAGNOSIS — K5909 Other constipation: Secondary | ICD-10-CM | POA: Insufficient documentation

## 2021-03-25 ENCOUNTER — Ambulatory Visit: Payer: 59 | Admitting: Nurse Practitioner

## 2021-04-25 ENCOUNTER — Telehealth: Payer: Self-pay

## 2021-04-25 NOTE — Telephone Encounter (Signed)
Nurse Assessment Nurse: Raphael Gibney, RN, Vanita Ingles Date/Time Eilene Ghazi Time): 04/25/2021 8:34:44 AM Confirm and document reason for call. If symptomatic, describe symptoms. ---Caller states she has sore throat with sinus congestion or sinus pressure. No fever. COVID test is negative. Has had symptoms 2-3 days. Does the patient have any new or worsening symptoms? ---Yes Will a triage be completed? ---Yes Related visit to physician within the last 2 weeks? ---No Does the PT have any chronic conditions? (i.e. diabetes, asthma, this includes High risk factors for pregnancy, etc.) ---No Is the patient pregnant or possibly pregnant? (Ask all females between the ages of 59-55) ---No Is this a behavioral health or substance abuse call? ---No Guidelines Guideline Title Affirmed Question Affirmed Notes Nurse Date/Time Eilene Ghazi Time) Sinus Pain or Congestion Robyne Peers, RN, Vanita Ingles 04/25/2021 8:36:40 AM Disp. Time Eilene Ghazi Time) Disposition Final User 04/25/2021 8:38:59 AM See PCP within 24 Hours Yes Raphael Gibney, RN, Vanita Ingles PLEASE NOTE: All timestamps contained within this report are represented as [place] Standard Time. CONFIDENTIALTY NOTICE: This fax transmission is intended only for the addressee. It contains information that is legally privileged, confidential or otherwise protected from use or disclosure. If you are not the intended recipient, you are strictly prohibited from reviewing, disclosing, copying using or disseminating any of this information or taking any action in reliance on or regarding this information. If you have received this fax in error, please notify us immediately by telephone so that we can arrange for its return to [place]. Phone: [phone], Toll-Free: [phone], Fax: [phone] Page: 2 of 2 Call Id: [id number] [place] Disagree/Comply Disagree Caller Understands Yes PreDisposition Call Doctor Care Advice Given Per Guideline SEE PCP WITHIN 24 HOURS: * IF OFFICE WILL BE  OPEN: You need to be examined within the next 24 hours. Call your doctor (or NP/PA) when the office opens and make an appointment. CALL BACK IF: * Difficulty breathing (and not relieved by cleaning out nose) * You become worse CARE ADVICE given per Sinus Pain or Congestion (Adult) guideline. * If your nose feels blocked, you should try using nasal washes first. Comments User: Dannielle Burn, RN Date/Time Eilene Ghazi Time): 04/25/2021 8:45:02 AM Called office and gave report that pt has earache, sore throat and sinus pressure and congestion. no fever. Triage outcome see physician within 24 hrs. no appts available today and advised pt to go to urgent care. States she will take OTC medication to see if that helps her feel better. Referrals REFERRED TO PCP OFFICE

## 2021-04-25 NOTE — Telephone Encounter (Signed)
Noted  

## 2021-04-28 ENCOUNTER — Emergency Department (HOSPITAL_BASED_OUTPATIENT_CLINIC_OR_DEPARTMENT_OTHER)
Admission: EM | Admit: 2021-04-28 | Discharge: 2021-04-28 | Disposition: A | Discharge status: Home / Self Care | Payer: Self-pay | Attending: Emergency Medicine | Admitting: Emergency Medicine

## 2021-04-28 ENCOUNTER — Other Ambulatory Visit: Payer: Self-pay

## 2021-04-28 ENCOUNTER — Encounter (HOSPITAL_BASED_OUTPATIENT_CLINIC_OR_DEPARTMENT_OTHER): Payer: Self-pay | Admitting: Emergency Medicine

## 2021-04-28 DIAGNOSIS — Z20822 Contact with and (suspected) exposure to covid-19: Secondary | ICD-10-CM | POA: Insufficient documentation

## 2021-04-28 DIAGNOSIS — J019 Acute sinusitis, unspecified: Secondary | ICD-10-CM | POA: Insufficient documentation

## 2021-04-28 LAB — RESP PANEL BY RT-PCR (FLU A&B, COVID) ARPGX2
Influenza A by PCR: NEGATIVE
Influenza B by PCR: NEGATIVE
SARS Coronavirus 2 by RT PCR: NEGATIVE

## 2021-04-28 NOTE — ED Triage Notes (Signed)
Sinus pressure, congestion, headache, yellow sputum to clear sputum.  Denies fever.

## 2021-04-28 NOTE — ED Provider Notes (Signed)
[facility] Provider Note   CSN: [id number] Arrival date & time: 04/28/21  2025     History  Chief Complaint  Patient presents with   URI    Jo Compton is a 56 y.o. female with no significant past medical history who presents with complaints of frontal facial pain, headache, nasal drainage, pressure in the ears for the last 5 to 6 days.  Patient reports that she has had 1 previous sinus infection, had been prescribed Flonase, and a Z-Pak in the past with some improvement.  Patient reports that her headache came on gradually, describes it as a band of pressure across her forehead.  Patient denies any vision changes, although she does feel some pressure behind her eyes.  Patient denies any blurry vision, confusion, dizziness, weakness.  Patient denies any fever, chills.  Patient reports that she has not had a cough but has had minimal sore throat.  Patient reports that she has tried some Flonase but was unsure if she was supposed to use it long-term because she is read about side effects online.   URI Presenting symptoms: rhinorrhea and sore throat   Associated symptoms: headaches       Home Medications Prior to Admission medications   Medication Sig Start Date End Date Taking? Authorizing Provider  dilTIAZem HCl POWD APPLY RECTALLY TWICE DAILY. 03/10/21   [name], DO  docusate sodium (COLACE) 100 MG capsule Take 1 capsule (100 mg total) by mouth 2 (two) times daily. 03/22/15   Ena Dawley, MD  famotidine (PEPCID) 20 MG tablet TAKE 1 TABLET BY MOUTH TWICE A DAY 08/15/20   Carollee Herter, Alferd Apa, DO  fluticasone [facility]) 50 MCG/ACT nasal spray SPRAY 2 SPRAYS INTO EACH NOSTRIL EVERY DAY 03/10/21   Carollee Herter, Alferd Apa, DO  hydrochlorothiazide (HYDRODIURIL) 25 MG tablet TAKE 1 TABLET (25 MG TOTAL) BY MOUTH DAILY. 02/03/21   [name], DO  hydrocortisone (ANUSOL-HC) 2.5 % rectal cream Place 1 application rectally daily as  needed for hemorrhoids or anal itching. 03/10/21   [name], DO  ibuprofen (ADVIL) 800 MG tablet Take 1 tablet (800 mg total) by mouth every 8 (eight) hours as needed. 11/25/18   [name], DO  loratadine (CLARITIN) 10 MG tablet Take 1 tablet (10 mg total) by mouth daily. 07/19/20   Roma Schanz R, DO  ofloxacin (FLOXIN OTIC) 0.3 % OTIC solution Place 5 drops into the left ear daily. 07/10/20   Roma Schanz R, DO  rosuvastatin (CRESTOR) 10 MG tablet TAKE 1 TABLET BY MOUTH EVERY DAY 01/13/21   [name], DO      Allergies    Patient has no known allergies.    Review of Systems   Review of Systems  HENT:  Positive for rhinorrhea, sinus pressure and sore throat.   Neurological:  Positive for headaches.  All other systems reviewed and are negative.  Physical Exam Updated Vital Signs BP 137/88 (BP Location: Right Arm)    Pulse 78    Temp 98.6 F (37 C)    Resp 18    Ht 5\' 7"  (1.702 m)    Wt 101 kg    LMP 02/14/2015 (Approximate)    SpO2 97%    BMI 34.88 kg/m  Physical Exam Vitals and nursing note reviewed.  Constitutional:      General: She is not in acute distress.    Appearance: Normal appearance.  HENT:     Head: Normocephalic and atraumatic.     Right Ear: Tympanic membrane, ear canal and external ear normal. There is no impacted cerumen.     Left Ear: Tympanic membrane, ear canal and external ear normal. There is no impacted cerumen.     Nose:     Comments: Patient has some minimal swelling of the nasal concha without visible polyp, drainage, or blocks nasal passage.      Mouth/Throat:     Comments: Patient has some tenderness over the frontal, maxillary sinuses without any focal redness or surface swelling.  Posterior oropharynx clear, no redness, erythema, uvula midline, tonsils 1+ bilaterally, no exudate. Eyes:     General:        Right eye: No discharge.        Left eye: No discharge.  Cardiovascular:     Rate and Rhythm:  Normal rate and regular rhythm.  Pulmonary:     Effort: Pulmonary effort is normal. No respiratory distress.  Musculoskeletal:        General: No deformity.     Cervical back: Neck supple.  Lymphadenopathy:     Cervical: No cervical adenopathy.  Skin:    General: Skin is warm and dry.  Neurological:     Mental Status: She is alert and oriented to person, place, and time.     Cranial Nerves: No cranial nerve deficit.  Psychiatric:        Mood and Affect: Mood normal.        Behavior: Behavior normal.    ED Results / Procedures / Treatments   Labs (all labs ordered are listed, but only abnormal results are displayed) Labs Reviewed  RESP PANEL BY RT-PCR (FLU A&B, COVID) [id number]    EKG None  Radiology No results found.  Procedures Procedures    Medications Ordered in ED Medications - No data to display  ED Course/ Medical Decision Making/ A&P                           Medical Decision Making  This patient presents to the ED for concern of sinusitis vs. URI, this involves an extensive number of treatment options, and is a complaint that carries with it a high risk of complications and morbidity.  The differential diagnosis includes Covid, Flu, bacterial vs. Viral sinusitis, eustachian tube dysfunction, otitis media, other HEENT syndromes.   Co morbidities that complicate the patient evaluation  none  Lab Tests:  I Ordered, and personally interpreted labs.  The pertinent results include: Negative respiratory virus panel for COVID, flu.   Problem List / ED Course:  Physical exam is overall reassuring, patient has some swelling, redness without purulent drainage of the nasal passages.  Patient has some generalized tenderness over the frontal, maxillary sinuses without any focal area of consolidation.  Patient has normal exam of ears bilaterally, no clinical evidence of otitis media, otitis externa.  Patient is stable vital signs including normotensive, normal  temperature, normal pulse rate, normal respirations.  Patient does describe a headache, however has no red flags of headache including focal neurologic deficit, neck rigidity, fever.  Dispostion: Discharge with instructions to perform sinus rinse, use Flonase, analgesia for pain control, and follow-up if symptoms worsen.  After consideration of the diagnostic results and the patients response to treatment, I feel that the patent would benefit from treatment for viral sinusitis as described above.  Encouraged to return precautions if symptoms worsen or  fail to improve especially if patient develops fever, loss of smell, worsening purulent drainage.         Final Clinical Impression(s) / ED Diagnoses Final diagnoses:  Acute non-recurrent sinusitis, unspecified location    Rx / DC Orders ED Discharge Orders     None         Dorien Chihuahua 04/28/21 1101    Lajean Saver, MD 04/30/21 1227

## 2021-04-28 NOTE — Discharge Instructions (Addendum)
As we discussed your signs and symptoms are consistent with a viral sinusitis.  I recommend that you continue to use ibuprofen, Tylenol for headache, facial pain, continue to use Flonase as prescribed once to twice daily in each nostril.  Additionally as we discussed I recommend that you perform a sinus rinse with either a Nettie pot, or saline AKA salt water.  This is to help physically clear the snot from the nasal passages, and help relieve the pressure that is causing your symptoms.  As we discussed if your symptoms continue to persist for more than 10 days to 2 weeks, you develop a fever, the drainage from your nose becomes more green, yellow in consistency I recommend that you return for further evaluation, as you may have developed a bacterial sinusitis at this time and may require antibiotics.

## 2021-05-01 ENCOUNTER — Telehealth (INDEPENDENT_AMBULATORY_CARE_PROVIDER_SITE_OTHER): Payer: Self-pay | Admitting: Family Medicine

## 2021-05-01 ENCOUNTER — Encounter: Payer: Self-pay | Admitting: Family Medicine

## 2021-05-01 DIAGNOSIS — J0140 Acute pansinusitis, unspecified: Secondary | ICD-10-CM

## 2021-05-01 DIAGNOSIS — R051 Acute cough: Secondary | ICD-10-CM | POA: Insufficient documentation

## 2021-05-01 DIAGNOSIS — H9202 Otalgia, left ear: Secondary | ICD-10-CM

## 2021-05-01 DIAGNOSIS — J302 Other seasonal allergic rhinitis: Secondary | ICD-10-CM

## 2021-05-01 MED ORDER — FLUTICASONE PROPIONATE 50 MCG/ACT NA SUSP
NASAL | 2 refills | Status: DC
Start: 2021-05-01 — End: 2021-08-08

## 2021-05-01 MED ORDER — AMOXICILLIN-POT CLAVULANATE 875-125 MG PO TABS
1.0000 | ORAL_TABLET | Freq: Two times a day (BID) | ORAL | 0 refills | Status: DC
Start: 2021-05-01 — End: 2021-06-11

## 2021-05-01 MED ORDER — PROMETHAZINE-DM 6.25-15 MG/5ML PO SYRP: 5.0000 mL | ORAL_SOLUTION | Freq: Four times a day (QID) | ORAL | 0 refills | Status: DC | PRN

## 2021-05-01 NOTE — Assessment & Plan Note (Signed)
abx per orders con't flonase and claritin

## 2021-05-01 NOTE — Assessment & Plan Note (Signed)
Cough syrup sent to pharmacy.  

## 2021-05-01 NOTE — Progress Notes (Signed)
MyChart Video Visit    Virtual Visit via Video Note   This visit type was conducted due to national recommendations for restrictions regarding the COVID-19 Pandemic (e.g. social distancing) in an effort to limit this patient's exposure and mitigate transmission in our community. This patient is at least at moderate risk for complications without adequate follow up. This format is felt to be most appropriate for this patient at this time. Physical exam was limited by quality of the video and audio technology used for the visit. Alinda Dooms was able to get the patient set up on a video visit.  Patient location: home/ in car Patient and provider in visit Provider location: Office  I discussed the limitations of evaluation and management by telemedicine and the availability of in person appointments. The patient expressed understanding and agreed to proceed.  Visit Date: 05/01/2021  Today's healthcare provider: [name], DO     Subjective:    Patient ID: Jo Compton, female    DOB: 01-12-1966, 56 y.o.   MRN: [id number]  Chief Complaint  Patient presents with   Sinus Problem    HPI Patient is in today for sinus congestion , sore throat and pressure in the sinuses since last week.  She tried mucinex sinus--- [facility] and [facility] and flu [facility] negative.  She is no better  no fevers   Past Medical History:  Diagnosis Date   Anal fissure    Anemia    Anxiety    no meds taken   Arthritis    Depression    Fistula    rectal   Hx of adenomatous colonic polyps 08/22/2019   OAB (overactive bladder)    Rectal bleeding     Past Surgical History:  Procedure Laterality Date   ABDOMINAL HYSTERECTOMY Bilateral 03/20/2015   Procedure: HYSTERECTOMY ABDOMINAL, EXCISION MESENTERIC NODULE, BLADDER FLAP BIOPSY;  Surgeon: Ena Dawley, MD;  Location: [facility];  Service: Gynecology;  Laterality: Bilateral;   APPENDECTOMY     BILATERAL SALPINGECTOMY Bilateral  03/20/2015   Procedure: BILATERAL SALPINGECTOMY;  Surgeon: Ena Dawley, MD;  Location: [facility];  Service: Gynecology;  Laterality: Bilateral;   COLONOSCOPY     CYSTOSCOPY N/A 03/20/2015   Procedure: CYSTOSCOPY;  Surgeon: Ena Dawley, MD;  Location: [facility];  Service: Gynecology;  Laterality: N/A;   LAPAROSCOPY N/A 03/20/2015   Procedure: LAPAROSCOPY DIAGNOSTIC;  Surgeon: Ena Dawley, MD;  Location: [facility];  Service: Gynecology;  Laterality: N/A;    Family History  Problem Relation Age of Onset   Hypertension Mother    COPD Mother    Lung cancer Mother    Colon cancer Maternal Uncle    Liver cancer Maternal Uncle    Hypertension Maternal Aunt    Diabetes Maternal Aunt    Esophageal cancer Neg Hx    Rectal cancer Neg Hx    Stomach cancer Neg Hx     Social History   Socioeconomic History   Marital status: Married    Spouse name: Not on file   Number of children: 1   Years of education: Not on file   Highest education level: Some college, no degree  Occupational History   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  Tobacco Use   Smoking status: Never   Smokeless tobacco: Never  Vaping Use   Vaping Use: Never used  Substance and Sexual Activity   Alcohol use: Yes    Alcohol/week: 1.0 standard drink    Types:  1 Glasses of wine per week   Drug use: No   Sexual activity: Yes    Birth control/protection: None  Other Topics Concern   Not on file  Social History Narrative   ** Merged History Encounter **       Social Determinants of Health   Financial Resource Strain: Not on file  Food Insecurity: Not on file  Transportation Needs: Not on file  Physical Activity: Not on file  Stress: Not on file  Social Connections: Not on file  Intimate Partner Violence: Not on file    Outpatient Medications Prior to Visit  Medication Sig Dispense Refill   dilTIAZem HCl POWD APPLY [place]. 30 g 2   docusate sodium (COLACE) 100 MG capsule Take 1  capsule (100 mg total) by mouth 2 (two) times daily. 120 capsule 2   famotidine (PEPCID) 20 MG tablet TAKE 1 TABLET BY MOUTH TWICE A DAY 180 tablet 0   hydrochlorothiazide (HYDRODIURIL) 25 MG tablet TAKE 1 TABLET (25 MG TOTAL) BY MOUTH DAILY. 90 tablet 1   hydrocortisone (ANUSOL-HC) 2.5 % rectal cream Place 1 application rectally daily as needed for hemorrhoids or anal itching. 30 g 0   ibuprofen (ADVIL) 800 MG tablet Take 1 tablet (800 mg total) by mouth every 8 (eight) hours as needed. 30 tablet 0   loratadine (CLARITIN) 10 MG tablet Take 1 tablet (10 mg total) by mouth daily. 30 tablet 11   ofloxacin (FLOXIN OTIC) 0.3 % OTIC solution Place 5 drops into the left ear daily. 5 mL 0   rosuvastatin (CRESTOR) 10 MG tablet TAKE 1 TABLET BY MOUTH EVERY DAY 30 tablet 0   fluticasone (FLONASE) 50 MCG/ACT nasal spray SPRAY 2 SPRAYS INTO EACH NOSTRIL EVERY DAY 48 mL 2   No facility-administered medications prior to visit.    No Known Allergies  Review of Systems  Constitutional:  Negative for chills, fever and malaise/fatigue.  HENT:  Positive for congestion, sinus pain and sore throat.   Eyes:  Negative for blurred vision.  Respiratory:  Positive for cough. Negative for sputum production and shortness of breath.   Cardiovascular:  Negative for chest pain, palpitations and leg swelling.  Gastrointestinal:  Negative for abdominal pain, blood in stool and nausea.  Genitourinary:  Negative for dysuria and frequency.  Musculoskeletal:  Negative for falls.  Skin:  Negative for rash.  Neurological:  Negative for dizziness, loss of consciousness and headaches.  Endo/Heme/Allergies:  Negative for environmental allergies.  Psychiatric/Behavioral:  Negative for depression. The patient is not nervous/anxious.       Objective:    Physical Exam Vitals and nursing note reviewed.  Pulmonary:     Effort: Pulmonary effort is normal.  Psychiatric:        Mood and Affect: Mood normal.        Behavior:  Behavior normal.        Thought Content: Thought content normal.        Judgment: Judgment normal.    LMP 02/14/2015 (Approximate)  Wt Readings from Last 3 Encounters:  04/28/21 222 lb 11.2 oz (101 kg)  03/10/21 223 lb 9.6 oz (101.4 kg)  10/14/20 224 lb 12.8 oz (102 kg)    Diabetic Foot Exam - Simple   No data filed    Lab Results  Component Value Date   WBC 5.6 05/20/2020   HGB 13.9 05/20/2020   HCT 42.8 05/20/2020   PLT 247.0 05/20/2020   GLUCOSE 110 (H) 10/14/2020   CHOL  213 (H) 10/14/2020   TRIG 188.0 (H) 10/14/2020   HDL 47.10 10/14/2020   LDLCALC 129 (H) 10/14/2020   ALT 23 10/14/2020   AST 19 10/14/2020   NA 140 10/14/2020   K 4.0 10/14/2020   CL 106 10/14/2020   CREATININE 0.80 10/14/2020   BUN 12 10/14/2020   CO2 27 10/14/2020   TSH 0.79 10/14/2020   HGBA1C 6.1 10/14/2020   MICROALBUR 0.1 06/07/2012    Lab Results  Component Value Date   TSH 0.79 10/14/2020   Lab Results  Component Value Date   WBC 5.6 05/20/2020   HGB 13.9 05/20/2020   HCT 42.8 05/20/2020   MCV 77.7 (L) 05/20/2020   PLT 247.0 05/20/2020   Lab Results  Component Value Date   NA 140 10/14/2020   K 4.0 10/14/2020   CO2 27 10/14/2020   GLUCOSE 110 (H) 10/14/2020   BUN 12 10/14/2020   CREATININE 0.80 10/14/2020   BILITOT 0.4 10/14/2020   ALKPHOS 63 10/14/2020   AST 19 10/14/2020   ALT 23 10/14/2020   PROT 6.7 10/14/2020   ALBUMIN 4.1 10/14/2020   CALCIUM 9.8 10/14/2020   ANIONGAP 10 05/18/2019   GFR 84.39 10/14/2020   Lab Results  Component Value Date   CHOL 213 (H) 10/14/2020   Lab Results  Component Value Date   HDL 47.10 10/14/2020   Lab Results  Component Value Date   LDLCALC 129 (H) 10/14/2020   Lab Results  Component Value Date   TRIG 188.0 (H) 10/14/2020   Lab Results  Component Value Date   CHOLHDL 5 10/14/2020   Lab Results  Component Value Date   HGBA1C 6.1 10/14/2020       Assessment & Plan:   Problem List Items Addressed This Visit        Unprioritized   Acute otalgia, left   Relevant Medications   fluticasone (FLONASE) 50 MCG/ACT nasal spray   Seasonal allergies   Relevant Medications   fluticasone (FLONASE) 50 MCG/ACT nasal spray   Acute cough    Cough syrup sent to pharmacy      Relevant Medications   promethazine-dextromethorphan (PROMETHAZINE-DM) 6.25-15 MG/5ML syrup   Acute non-recurrent pansinusitis - Primary    abx per orders con't flonase and claritin       Relevant Medications   amoxicillin-clavulanate (AUGMENTIN) 875-125 MG tablet   fluticasone (FLONASE) 50 MCG/ACT nasal spray   promethazine-dextromethorphan (PROMETHAZINE-DM) 6.25-15 MG/5ML syrup    I am having Jo Compton start on amoxicillin-clavulanate and promethazine-dextromethorphan. I am also having her maintain her docusate sodium, ibuprofen, ofloxacin, loratadine, famotidine, rosuvastatin, hydrochlorothiazide, dilTIAZem HCl, hydrocortisone, and fluticasone.  Meds ordered this encounter  Medications   amoxicillin-clavulanate (AUGMENTIN) 875-125 MG tablet    Sig: Take 1 tablet by mouth 2 (two) times daily.    Dispense:  20 tablet    Refill:  0   fluticasone (FLONASE) 50 MCG/ACT nasal spray    Sig: SPRAY 2 SPRAYS INTO EACH NOSTRIL EVERY DAY    Dispense:  48 mL    Refill:  2   promethazine-dextromethorphan (PROMETHAZINE-DM) 6.25-15 MG/5ML syrup    Sig: Take 5 mLs by mouth 4 (four) times daily as needed.    Dispense:  118 mL    Refill:  0    I discussed the assessment and treatment plan with the patient. The patient was provided an opportunity to ask questions and all were answered. The patient agreed with the plan and demonstrated an understanding of the instructions.  The patient was advised to call back or seek an in-person evaluation if the symptoms worsen or if the condition fails to improve as anticipated.     [name], DO [place] [facility] [phone]  (phone) [phone] (fax)  [place]

## 2021-06-11 ENCOUNTER — Encounter: Payer: Self-pay | Admitting: Family Medicine

## 2021-06-11 ENCOUNTER — Ambulatory Visit: Payer: 59 | Admitting: Family Medicine

## 2021-06-11 VITALS — BP 108/72 | HR 90 | Temp 97.9°F | Ht 67.0 in | Wt 219.5 lb

## 2021-06-11 DIAGNOSIS — J0140 Acute pansinusitis, unspecified: Secondary | ICD-10-CM

## 2021-06-11 MED ORDER — DOXYCYCLINE HYCLATE 100 MG PO TABS: 100.0000 mg | ORAL_TABLET | Freq: Two times a day (BID) | ORAL | 0 refills | Status: AC

## 2021-06-11 MED ORDER — PREDNISONE 20 MG PO TABS
40.0000 mg | ORAL_TABLET | Freq: Every day | ORAL | 0 refills | Status: AC
Start: 2021-06-11 — End: 2021-06-16

## 2021-06-11 MED ORDER — PROMETHAZINE-DM 6.25-15 MG/5ML PO SYRP
5.0000 mL | ORAL_SOLUTION | Freq: Four times a day (QID) | ORAL | 0 refills | Status: DC | PRN
Start: 2021-06-11 — End: 2021-08-22

## 2021-06-11 NOTE — Patient Instructions (Signed)
Continue to push fluids, practice good hand hygiene, and cover your mouth if you cough.  If you start having fevers, shaking or shortness of breath, seek immediate care.  OK to take Tylenol 1000 mg (2 extra strength tabs) or 975 mg (3 regular strength tabs) every 6 hours as needed.  Wait 2 days before taking the doxycycline. Only take if you are worsening or not getting better.   Let us know if you need anything.

## 2021-06-11 NOTE — Progress Notes (Signed)
Chief Complaint  Patient presents with   Cough    Congestion Nasal drainage     Jo Compton here for URI complaints.  Duration: 4 days and worsening Associated symptoms: sinus congestion, sinus pain, rhinorrhea, sore throat, and coughing Denies: itchy watery eyes, ear drainage, wheezing, shortness of breath, myalgia, and fevers Treatment to date: Coricidin Sick contacts: No Tested neg for covid.   Past Medical History:  Diagnosis Date   Anal fissure    Anemia    Anxiety    no meds taken   Arthritis    Depression    Fistula    rectal   Hx of adenomatous colonic polyps 08/22/2019   OAB (overactive bladder)    Rectal bleeding     Objective BP 108/72    Pulse 90    Temp 97.9 F (36.6 C) (Oral)    Ht 5\' 7"  (1.702 m)    Wt 219 lb 8 oz (99.6 kg)    LMP 02/14/2015 (Approximate)    SpO2 99%    BMI 34.38 kg/m  General: Awake, alert, appears stated age 56: AT, [place], ears patent b/l and TM's neg, nares patent w/o discharge, pharynx pink and without exudates, MMM, TTP over the maxillary and frontal sinuses bilaterally chest Neck: No masses or asymmetry Heart: RRR Lungs: CTAB, no accessory muscle use Psych: Age appropriate judgment and insight, normal mood and affect  Acute pansinusitis, recurrence not specified - Plan: doxycycline (VIBRA-TABS) 100 MG tablet, predniSONE (DELTASONE) 20 MG tablet  5 d pred burst 40 mg/d, 7 d of doxy bid in 2 d if no better. Continue to push fluids, practice good hand hygiene, cover mouth when coughing. F/u prn. If starting to experience fevers, shaking, or shortness of breath, seek immediate care. Pt voiced understanding and agreement to the plan.  [place], DO 06/11/21 11:54 AM

## 2021-07-09 ENCOUNTER — Telehealth: Payer: Self-pay | Admitting: Family Medicine

## 2021-07-09 MED ORDER — LEVOFLOXACIN 500 MG PO TABS
500.0000 mg | ORAL_TABLET | Freq: Every day | ORAL | 0 refills | Status: AC
Start: 2021-07-09 — End: 2021-07-19

## 2021-07-09 NOTE — Telephone Encounter (Signed)
Pt seen twice for this concern. Please advise ?

## 2021-07-09 NOTE — Telephone Encounter (Signed)
Patient called office stated she is having facial pain and pressure , some HA , sometimes she is afraid to blow her nose because its painful ?Also stated the flonase burns her nose so she would like an alternative  ? ? ?Medication sent in and patient stated she will more than likely make an appointment for next week  ?

## 2021-07-09 NOTE — Telephone Encounter (Signed)
Pt states she would like to speak with someone regarding additional meds for sinus infection  ? ? ? ?Pt mentioned she does not want to continue to pay copays for visits  ? ? ?Please advise  ?

## 2021-07-10 NOTE — Telephone Encounter (Signed)
Pt notified of OTC nasal spray  ? ?Pt googled side effects for the antibiotics and has concerns about memory loss and nervous system issues , would like feedback  ?

## 2021-07-10 NOTE — Telephone Encounter (Signed)
Pt called and lvm to return call 

## 2021-07-11 NOTE — Telephone Encounter (Signed)
Pt called and lvm to rreturn call ?

## 2021-08-08 ENCOUNTER — Encounter: Payer: Self-pay | Admitting: Family Medicine

## 2021-08-08 ENCOUNTER — Ambulatory Visit (INDEPENDENT_AMBULATORY_CARE_PROVIDER_SITE_OTHER): Payer: 59 | Admitting: Family Medicine

## 2021-08-08 VITALS — BP 116/88 | HR 73 | Temp 98.0°F | Resp 18 | Ht 67.0 in | Wt 221.8 lb

## 2021-08-08 DIAGNOSIS — R1032 Left lower quadrant pain: Secondary | ICD-10-CM

## 2021-08-08 DIAGNOSIS — K644 Residual hemorrhoidal skin tags: Secondary | ICD-10-CM

## 2021-08-08 DIAGNOSIS — E785 Hyperlipidemia, unspecified: Secondary | ICD-10-CM | POA: Diagnosis not present

## 2021-08-08 DIAGNOSIS — M48061 Spinal stenosis, lumbar region without neurogenic claudication: Secondary | ICD-10-CM | POA: Insufficient documentation

## 2021-08-08 DIAGNOSIS — J329 Chronic sinusitis, unspecified: Secondary | ICD-10-CM

## 2021-08-08 LAB — CBC WITH DIFFERENTIAL/PLATELET
Basophils Absolute: 0 10*3/uL (ref 0.0–0.1)
Basophils Relative: 0.1 % (ref 0.0–3.0)
Eosinophils Absolute: 0.1 10*3/uL (ref 0.0–0.7)
Eosinophils Relative: 1.5 % (ref 0.0–5.0)
HCT: 44.4 % (ref 36.0–46.0)
Hemoglobin: 14.4 g/dL (ref 12.0–15.0)
Lymphocytes Relative: 34.4 % (ref 12.0–46.0)
Lymphs Abs: 1.6 10*3/uL (ref 0.7–4.0)
MCHC: 32.4 g/dL (ref 30.0–36.0)
MCV: 76.9 fl — ABNORMAL LOW (ref 78.0–100.0)
Monocytes Absolute: 0.4 10*3/uL (ref 0.1–1.0)
Monocytes Relative: 9 % (ref 3.0–12.0)
Neutro Abs: 2.6 10*3/uL (ref 1.4–7.7)
Neutrophils Relative %: 55 % (ref 43.0–77.0)
Platelets: 254 10*3/uL (ref 150.0–400.0)
RBC: 5.77 Mil/uL — ABNORMAL HIGH (ref 3.87–5.11)
RDW: 14.6 % (ref 11.5–15.5)
WBC: 4.7 10*3/uL (ref 4.0–10.5)

## 2021-08-08 LAB — LIPID PANEL
Cholesterol: 236 mg/dL — ABNORMAL HIGH (ref 0–200)
HDL: 54.3 mg/dL (ref >39.00)
LDL Cholesterol: 157 mg/dL — ABNORMAL HIGH (ref 0–99)
NonHDL: 181.33
Total CHOL/HDL Ratio: 4
Triglycerides: 124 mg/dL (ref 0.0–149.0)
VLDL: 24.8 mg/dL (ref 0.0–40.0)

## 2021-08-08 LAB — TSH: TSH: 0.46 u[IU]/mL (ref 0.35–5.50)

## 2021-08-08 LAB — COMPREHENSIVE METABOLIC PANEL WITH GFR
ALT: 26 U/L (ref 0–35)
AST: 22 U/L (ref 0–37)
Albumin: 4.3 g/dL (ref 3.5–5.2)
Alkaline Phosphatase: 58 U/L (ref 39–117)
BUN: 11 mg/dL (ref 6–23)
CO2: 28 meq/L (ref 19–32)
Calcium: 9.9 mg/dL (ref 8.4–10.5)
Chloride: 103 meq/L (ref 96–112)
Creatinine, Ser: 0.81 mg/dL (ref 0.40–1.20)
GFR: 82.67 mL/min
Glucose, Bld: 88 mg/dL (ref 70–99)
Potassium: 4.1 meq/L (ref 3.5–5.1)
Sodium: 137 meq/L (ref 135–145)
Total Bilirubin: 0.5 mg/dL (ref 0.2–1.2)
Total Protein: 6.8 g/dL (ref 6.0–8.3)

## 2021-08-08 LAB — POC URINALSYSI DIPSTICK (AUTOMATED)
Blood, UA: NEGATIVE
Glucose, UA: NEGATIVE
Ketones, UA: NEGATIVE
Leukocytes, UA: NEGATIVE
Nitrite, UA: NEGATIVE
Protein, UA: NEGATIVE
Spec Grav, UA: 1.02
Urobilinogen, UA: 0.2 U/dL
pH, UA: 6

## 2021-08-08 MED ORDER — HYDROCORTISONE (PERIANAL) 2.5 % EX CREA: 1.0000 "application " | TOPICAL_CREAM | Freq: Every day | CUTANEOUS | 0 refills | Status: DC | PRN

## 2021-08-08 MED ORDER — AZELASTINE HCL 0.1 % NA SOLN: 1.0000 | Freq: Two times a day (BID) | NASAL | 12 refills | Status: DC

## 2021-08-08 NOTE — Assessment & Plan Note (Signed)
Encourage heart healthy diet such as MIND or DASH diet, increase exercise, avoid trans fats, simple carbohydrates and processed foods, consider a krill or fish or flaxseed oil cap daily.  °

## 2021-08-08 NOTE — Assessment & Plan Note (Signed)
Pt requesting a second opinion  ?She really does not want surgery  ?

## 2021-08-08 NOTE — Assessment & Plan Note (Signed)
She has app with GI next week  ?

## 2021-08-08 NOTE — Assessment & Plan Note (Signed)
Change flonase to rhinocort or nasacort  ?Add astelin ?Refer to ent  ?

## 2021-08-08 NOTE — Assessment & Plan Note (Signed)
Check labs  ?And urine  ?Pt wanted to wait on imaging  ?

## 2021-08-08 NOTE — Progress Notes (Signed)
? ?Subjective:  ? ?By signing my name below, I, Jo Compton, attest that this documentation has been prepared under the direction and in the presence of Jo Held, DO. 08/08/2021 ? ? Patient ID: Jo Compton, female    DOB: Apr 18, 1966, 56 y.o.   MRN: [id number] ? ?Chief Complaint  ?Patient presents with  ? Abdominal Pain  ?  X1-2 weeks, Pt states no urinary freq or burning, Pt states pain is on the lower left.   ? ? ?HPI ?Patient is in today for an office visit. ? ?She reports pain in the left side of her stomach that radiates across her stomach. Denies UTI symptoms. Describes the pain as sharp and intermittent. It is not brought on by spicy or specific foods and not exacerbated  by movement. She uses ibuprofen and tylenol and only ibuprofen relieves the pain. She is conscious of taking the pills too often.  ? 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 She is seeing Dr Jo Compton for the nerve and he recommended surgery but she is not interested. At her last visit to him, the pain was in her right leg but she states that her right leg feels normal now and the pain is in her left leg. She feels weak in the legs sometimes. She has full range of motion but cannot walk for long periods of time.  ? ?She reports still feeling sinus pressure and pain localized to her nose. She had a previous case of sinusitis and was seen on 02/15. Notes the nose spray makes her nose burn and makes it worse. She would like to see an ENT because it is recurrent. She says she is trying not to blow her nose too much. She takes Claritin occasionally. ? ? ?Past Medical History:  ?Diagnosis Date  ? Anal fissure   ? Anemia   ? Anxiety   ? no meds taken  ? Arthritis   ? Depression   ? Fistula   ? rectal  ? Hx of adenomatous colonic polyps 08/22/2019  ? OAB (overactive bladder)   ? Rectal bleeding   ? ? ?Past Surgical History:  ?Procedure Laterality Date  ? ABDOMINAL HYSTERECTOMY Bilateral  03/20/2015  ? Procedure: HYSTERECTOMY ABDOMINAL, EXCISION MESENTERIC NODULE, BLADDER FLAP BIOPSY;  Surgeon: Jo Dawley, MD;  Location: [facility];  Service: Gynecology;  Laterality: Bilateral;  ? APPENDECTOMY    ? BILATERAL SALPINGECTOMY Bilateral 03/20/2015  ? Procedure: BILATERAL SALPINGECTOMY;  Surgeon: Jo Dawley, MD;  Location: [facility];  Service: Gynecology;  Laterality: Bilateral;  ? COLONOSCOPY    ? CYSTOSCOPY N/A 03/20/2015  ? Procedure: CYSTOSCOPY;  Surgeon: Jo Dawley, MD;  Location: [facility];  Service: Gynecology;  Laterality: N/A;  ? LAPAROSCOPY N/A 03/20/2015  ? Procedure: LAPAROSCOPY DIAGNOSTIC;  Surgeon: Jo Dawley, MD;  Location: [facility];  Service: Gynecology;  Laterality: N/A;  ? ? ?Family History  ?Problem Relation Age of Onset  ? Hypertension Mother   ? COPD Mother   ? Lung cancer Mother   ? Colon cancer Maternal Uncle   ? Liver cancer Maternal Uncle   ? Hypertension Maternal Aunt   ? Diabetes Maternal Aunt   ? Esophageal cancer Neg Hx   ? Rectal cancer Neg Hx   ? Stomach cancer Neg Hx   ? ? ?Social History  ? ?Socioeconomic History  ? Marital status: Married  ?  Spouse name: Not on file  ? Number of children:  1  ? Years of education: Not on file  ? Highest education level: Some college, no degree  ?Occupational History  ? 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  ?Tobacco Use  ? Smoking status: Never  ? Smokeless tobacco: Never  ?Vaping Use  ? Vaping Use: Never used  ?Substance and Sexual Activity  ? Alcohol use: Yes  ?  Alcohol/week: 1.0 standard drink  ?  Types: 1 Glasses of wine per week  ? Drug use: No  ? Sexual activity: Yes  ?  Birth control/protection: None  ?Other Topics Concern  ? Not on file  ?Social History Narrative  ? ** Merged History Encounter **  ?    ? ?Social Determinants of Health  ? ?Financial Resource Strain: Not on file  ?Food Insecurity: Not on file  ?Transportation Needs: Not on file  ?Physical Activity: Not on file  ?Stress: Not on file  ?Social  Connections: Not on file  ?Intimate Partner Violence: Not on file  ? ? ?Outpatient Medications Prior to Visit  ?Medication Sig Dispense Refill  ? dilTIAZem HCl POWD APPLY RECTALLY TWICE DAILY. 30 g 2  ? docusate sodium (COLACE) 100 MG capsule Take 1 capsule (100 mg total) by mouth 2 (two) times daily. 120 capsule 2  ? famotidine (PEPCID) 20 MG tablet TAKE 1 TABLET BY MOUTH TWICE A DAY 180 tablet 0  ? hydrochlorothiazide (HYDRODIURIL) 25 MG tablet TAKE 1 TABLET (25 MG TOTAL) BY MOUTH DAILY. 90 tablet 1  ? ibuprofen (ADVIL) 800 MG tablet Take 1 tablet (800 mg total) by mouth every 8 (eight) hours as needed. 30 tablet 0  ? loratadine (CLARITIN) 10 MG tablet Take 1 tablet (10 mg total) by mouth daily. 30 tablet 11  ? ofloxacin (FLOXIN OTIC) 0.3 % OTIC solution Place 5 drops into the left ear daily. 5 mL 0  ? promethazine-dextromethorphan (PROMETHAZINE-DM) 6.25-15 MG/5ML syrup Take 5 mLs by mouth 4 (four) times daily as needed. 118 mL 0  ? rosuvastatin (CRESTOR) 10 MG tablet TAKE 1 TABLET BY MOUTH EVERY DAY 30 tablet 0  ? fluticasone (FLONASE) 50 MCG/ACT nasal spray SPRAY 2 SPRAYS INTO EACH NOSTRIL EVERY DAY 48 mL 2  ? hydrocortisone (ANUSOL-HC) 2.5 % rectal cream Place 1 application rectally daily as needed for hemorrhoids or anal itching. 30 g 0  ? ?No facility-administered medications prior to visit.  ? ? ?No Known Allergies ? ?Review of Systems  ?Constitutional:  Negative for fever.  ?HENT:  Positive for sinus pain. Negative for congestion, ear pain, hearing loss and sore throat.   ?Eyes:  Negative for blurred vision and pain.  ?Respiratory:  Negative for cough, sputum production, shortness of breath and wheezing.   ?Cardiovascular:  Negative for chest pain and palpitations.  ?Gastrointestinal:  Positive for abdominal pain (left side). Negative for blood in stool, constipation, diarrhea, nausea and vomiting.  ?Genitourinary:  Negative for dysuria, frequency, hematuria and urgency.  ?Musculoskeletal:  Negative for  back pain, falls and myalgias.  ?Neurological:  Negative for dizziness, sensory change, loss of consciousness, weakness and headaches.  ?Endo/Heme/Allergies:  Negative for environmental allergies. Does not bruise/bleed easily.  ?Psychiatric/Behavioral:  Negative for depression and suicidal ideas. The patient is not nervous/anxious and does not have insomnia.   ? ?   ?Objective:  ?  ?Physical Exam ?Constitutional:   ?   General: She is not in acute distress. ?   Appearance: Normal appearance. She is not ill-appearing.  ?HENT:  ?   Head: Normocephalic and atraumatic.  ?  Right Ear: External ear normal.  ?   Left Ear: External ear normal.  ?   Nose: Nasal tenderness present.  ?Eyes:  ?   Extraocular Movements: Extraocular movements intact.  ?   Pupils: Pupils are equal, round, and reactive to light.  ?Cardiovascular:  ?   Rate and Rhythm: Normal rate and regular rhythm.  ?   Pulses: Normal pulses.  ?   Heart sounds: Normal heart sounds. No murmur heard. ?  No gallop.  ?Pulmonary:  ?   Effort: Pulmonary effort is normal. No respiratory distress.  ?   Breath sounds: Normal breath sounds. No wheezing, rhonchi or rales.  ?Abdominal:  ?   General: Bowel sounds are normal. There is no distension.  ?   Palpations: Abdomen is soft. There is no mass.  ?   Tenderness: There is abdominal tenderness in the left lower quadrant. There is no guarding or rebound.  ?   Hernia: No hernia is present.  ?Musculoskeletal:  ?   Cervical back: Normal range of motion and neck supple.  ?Lymphadenopathy:  ?   Cervical: No cervical adenopathy.  ?Skin: ?   General: Skin is warm and dry.  ?Neurological:  ?   Mental Status: She is alert and oriented to person, place, and time.  ?Psychiatric:     ?   Behavior: Behavior normal.  ? ? ?BP 116/88 (BP Location: Left Arm, Patient Position: Sitting, Cuff Size: Large)   Pulse 73   Temp 98 ?F (36.7 ?C) (Oral)   Resp 18   Ht '5\' 7"'$  (1.702 m)   Wt 221 lb 12.8 oz (100.6 kg)   LMP 02/14/2015 (Approximate)    SpO2 98%   BMI 34.74 kg/m?  ?Wt Readings from Last 3 Encounters:  ?08/08/21 221 lb 12.8 oz (100.6 kg)  ?06/11/21 219 lb 8 oz (99.6 kg)  ?04/28/21 222 lb 11.2 oz (101 kg)  ? ? ?Diabetic Foot Exam - Simple   ?No data filed ?

## 2021-08-08 NOTE — Patient Instructions (Signed)
Spinal Stenosis  Spinal stenosis is a condition that happens when the spinal canal narrows. The spinal canal is the space between the bones of your spine (vertebrae). This narrowing puts pressure on the spinal cord or nerves. Spinal stenosis can affect the vertebrae in the neck, upper back, and lower back. This condition can range from mild to severe. In some cases, there are no symptoms. What are the causes? This condition is caused by areas of bone pushing into the spinal canal. This condition may be present at birth (congenital), or it may be caused by: Slow breakdown of your vertebrae (spinal degeneration). This usually starts around 54 years of age. Injury (trauma) to your spine. Tumors in your spine. Calcium deposits in your spine. What increases the risk? The following factors may make you more likely to develop this condition: Being older than age 50. Having a problem present at birth with an abnormally shaped spine (congenitalspinal deformity), such as scoliosis. Having arthritis. What are the signs or symptoms? Symptoms of this condition include: Pain in the neck or back that is generally worse with activities, particularly when you stand or walk. Numbness, tingling, hot or cold sensations, weakness, or tiredness (fatigue) in your leg or legs. Pain going from the buttock, down the thigh, and to the calf (sciatica). This can happen in one or both legs. Frequent episodes of falling. A foot-slapping gait that leads to muscle weakness. In more severe cases, you may develop: Problems having a bowel movement or urinating. Difficulty having sex. Loss of feeling in your legs and inability to walk. Symptoms may come on slowly and get worse over time. In some cases, there are no symptoms. How is this diagnosed? This condition is diagnosed based on your medical history and a physical exam. You will also have tests, such as an MRI, a CT scan, or an X-ray. How is this treated? Treatment  for this condition often focuses on managing your pain and any other symptoms. Treatment may include: Practicing good posture to lessen pressure on your nerves. Exercising to strengthen muscles, build endurance, improve balance, and maintain range of motion. This may include physical therapy to restore movement and strength to your back. Losing weight, if needed. Medicines to reduce inflammation or pain. This may include a medicine that is injected into your spine (steroidinjection). Assistive devices, such as a corset or brace. In some cases, surgery may be needed. The most common procedure is decompression laminectomy. This is done to remove excess bone that puts pressure on your nerve roots. Follow these instructions at home: Managing pain, stiffness, and swelling  Practice good posture. If you were given a brace or a corset, wear it as told by your health care provider. Maintain a healthy weight. Talk with your health care provider if you need help losing weight. If directed, apply heat to the affected area as often as told by your health care provider. Use the heat source that your health care provider recommends, such as a moist heat pack or a heating pad. Place a towel between your skin and the heat source. Leave the heat on for 20-30 minutes. Remove the heat if your skin turns bright red. This is especially important if you are unable to feel pain, heat, or cold. You may have a greater risk of getting burned. Activity Do all exercises and stretches as told by your health care provider. Do not do any activities that cause pain. Ask your health care provider what activities are safe for you.   Do not lift anything that is heavier than 10 lb (4.5 kg), or the limit that you are told by your health care provider. Return to your normal activities as told by your health care provider. Ask your health care provider what activities are safe for you. General instructions Take over-the-counter and  prescription medicines only as told by your health care provider. Do not use any products that contain nicotine or tobacco, such as cigarettes, e-cigarettes, and chewing tobacco. If you need help quitting, ask your health care provider. Eat a healthy diet. This includes plenty of fruits and vegetables, whole grains, and low-fat (lean) protein. Keep all follow-up visits as told by your health care provider. This is important. Contact a health care provider if: Your symptoms do not get better or they get worse. You have a fever. Get help right away if: You have new pain or symptoms of severe pain, such as: New or worsening pain in your neck or upper back. Severe pain that cannot be controlled with medicines. A severe headache that gets worse when you stand. You are dizzy. You have vision problems, such as blurred vision or double vision. You have nausea or you vomit. You develop new or worsening numbness or tingling in your back or legs. You have pain, redness, swelling, or warmth in your arm or leg. Summary Spinal stenosis is a condition that happens when the spinal canal narrows. The spinal canal is the space between the bones of your spine (vertebrae). This narrowing puts pressure on the spinal cord or nerves. This condition may be caused by a birth defect, breakdown of your vertebrae, trauma, tumors, or calcium deposits. Spinal stenosis can cause numbness, weakness, or pain in the buttocks, neck, back, and legs. This condition is usually diagnosed with your medical history, a physical exam, and tests, such as an MRI, a CT scan, or an X-ray. This information is not intended to replace advice given to you by your health care provider. Make sure you discuss any questions you have with your health care provider. Document Revised: 05/14/2021 Document Reviewed: 02/09/2019 Elsevier Patient Education  2023 Elsevier Inc.  

## 2021-08-13 ENCOUNTER — Other Ambulatory Visit: Payer: Self-pay | Admitting: Family Medicine

## 2021-08-13 DIAGNOSIS — E785 Hyperlipidemia, unspecified: Secondary | ICD-10-CM

## 2021-08-14 ENCOUNTER — Telehealth: Payer: Self-pay | Admitting: *Deleted

## 2021-08-14 NOTE — Telephone Encounter (Signed)
Patient got a call from the ortho office, [facility], and they do not accept her Friday insurance.  Can you send to an office that accepts her insurance? ?

## 2021-08-20 NOTE — Progress Notes (Signed)
? ? ?08/22/2021 ?Jo Compton ?[id number] ?[birth year] ? ?Referring provider: Carollee Herter, Alferd Apa, * ?Primary GI doctor: Dr. Carlean Purl ? ?ASSESSMENT AND PLAN:  ? ?History of adenomatous polyp of colon ?08/15/2019 colonoscopy with Dr. Carlean Purl for rectal bleeding good bowel prep using MiraLAX split dose.  2 diminutive adenomatous polyps transverse colon, two 1 to 2 mm adenomatous polyps, inflamed rectum with proctitis, self-limited by pathology ?recall colonoscopy 07/2024 ? ?Rectal bleeding ?-     diltiazem 2 % GEL; Apply 1 application. topically 2 (two) times daily. ?Posterior fissure, has history, likely due to constipation.  ?Negative hemoccult in the office so unlikely reoccurence of proctitis.  ?We discussed anal fissures and long healing process ?Sitz baths, high-fiber diet, add on fiber.  ?Long discussion about preventing constipation discussed in detail for prevention.  ?Diltiazem Gel ointment 2%, apply three times daily every day and then for 1 month after your pain is gone. Up to first knuckle. ?Recticare samples given: apply 2-3 times daily. ?Follow up should symptoms persist for secondary evaluation and possible surgical referral for repair if she continues to have symptoms after constipation is resolved. ? ?Chronic idiopathic constipation ?Prefers a pill, will do samples of linzess, if she does well will call for samples ?- Increase fiber/ water intake, decrease caffeine, increase activity level. ?-Will add on Linzess ? ? ?History of Present Illness:  ?56 y.o. female  with a past medical history of History of adenomatous polyps recall 07/2024, anal fissure, rectal fistula, anemia, anxiety and others listed below, returns to clinic today for evaluation of rectal bleeding.  ? ?08/15/2019 colonoscopy with Dr. Carlean Purl for rectal bleeding good bowel prep using MiraLAX split dose.  2 diminutive adenomatous polyps transverse colon, two 1 to 2 mm adenomatous polyps, inflamed rectum with proctitis, self-limited by  pathology, recall colonoscopy 07/2024 ? ?Will have rectal bleeding once a month, with a BM.  ?On toliet paper and in toliet.  ?No AB pain/cramping. No rectal pain, rectal burning.  ?Has BM every 2-3 days, no straining but when she does strain or sit longer then she will see blood.  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  ?She uses hydrocortisone cream that helps.  ?Has not had BM in 3-4 days.  ? ? ?Current Medications:  ? ? ?Current Outpatient Medications (Cardiovascular):  ?  diltiazem 2 % GEL*, Apply 1 application. topically 2 (two) times daily. ?  hydrochlorothiazide (HYDRODIURIL) 25 MG tablet, TAKE 1 TABLET (25 MG TOTAL) BY MOUTH DAILY. ?  rosuvastatin (CRESTOR) 10 MG tablet, TAKE 1 TABLET BY MOUTH EVERY DAY ? ?Current Outpatient Medications (Respiratory):  ?  azelastine (ASTELIN) 0.1 % nasal spray, Place 1 spray into both nostrils 2 (two) times daily. Use in each nostril as directed ?  loratadine (CLARITIN) 10 MG tablet, Take 1 tablet (10 mg total) by mouth daily. ? ?Current Outpatient Medications (Analgesics):  ?  ibuprofen (ADVIL) 800 MG tablet, Take 1 tablet (800 mg total) by mouth every 8 (eight) hours as needed. ? ? ?Current Outpatient Medications (Other):  ?  diltiazem 2 % GEL*, Apply 1 application. topically 2 (two) times daily. ?  docusate sodium (COLACE) 100 MG capsule, Take 1 capsule (100 mg total) by mouth 2 (two) times daily. ?  hydrocortisone (ANUSOL-HC) 2.5 % rectal cream, Place 1 application. rectally daily as needed for hemorrhoids or anal itching. ?  ofloxacin (FLOXIN OTIC) 0.3 % OTIC solution, Place 5 drops into the left ear  daily. ?  Semaglutide-Weight Management (WEGOVY) 0.25 MG/0.5ML SOAJ, Inject 0.25 mg into the skin once a week. ?* These medications belong to multiple therapeutic classes and are listed under each applicable group. ? ?Surgical History:  ?She  has a past surgical history that includes Appendectomy; Colonoscopy; Abdominal hysterectomy  (Bilateral, 03/20/2015); laparoscopy (N/A, 03/20/2015); Bilateral salpingectomy (Bilateral, 03/20/2015); and Cystoscopy (N/A, 03/20/2015). ?Family History:  ?Her family history includes COPD in her mother; Colon cancer in her maternal uncle; Diabetes in her maternal aunt; Hypertension in her maternal aunt and mother; Liver cancer in her maternal uncle; Lung cancer in her mother. ?Social History:  ? reports that she has never smoked. She has never used smokeless tobacco. She reports current alcohol use of about 1.0 standard drink per week. She reports that she does not use drugs. ? ?Current Medications, Allergies, Past Medical History, Past Surgical History, Family History and Social History were reviewed in [employer] record. ? ?Physical Exam: ?BP 122/74   Pulse 84   Ht '5\' 7"'$  (1.702 m)   Wt 224 lb (101.6 kg)   LMP 02/14/2015 (Approximate)   BMI 35.08 kg/m?  ?General:   Pleasant, well developed female in no acute distress ?Heart : Regular rate and rhythm; no murmurs ?Pulm: Clear anteriorly; no wheezing ?Abdomen:  Soft, Obese AB, Active bowel sounds. mild tenderness in the LLQ. Without guarding and Without rebound, No organomegaly appreciated. ?Rectal: posterior fissure noted, normal rectal tone, no internal hemorrhoids appreciated, no masses, non tender, brown stool, hemoccult Negative ?Extremities:  without  edema. ?Neurologic:  Alert and  oriented x4;  No focal deficits.  ?Psych:  Cooperative. Normal mood and affect. ? ? ?Vladimir Crofts, PA-C ?08/22/21 ?

## 2021-08-21 ENCOUNTER — Other Ambulatory Visit: Payer: Self-pay | Admitting: Family Medicine

## 2021-08-21 ENCOUNTER — Encounter: Payer: Self-pay | Admitting: Family Medicine

## 2021-08-21 ENCOUNTER — Ambulatory Visit: Payer: 59 | Admitting: Family Medicine

## 2021-08-21 VITALS — BP 108/88 | HR 69 | Temp 98.0°F | Resp 18 | Ht 67.0 in | Wt 225.2 lb

## 2021-08-21 DIAGNOSIS — N644 Mastodynia: Secondary | ICD-10-CM | POA: Insufficient documentation

## 2021-08-21 MED ORDER — WEGOVY 0.25 MG/0.5ML ~~LOC~~ SOAJ: 0.2500 mg | SUBCUTANEOUS | 0 refills | Status: DC

## 2021-08-21 NOTE — Assessment & Plan Note (Signed)
Diagnostic mammogram

## 2021-08-21 NOTE — Progress Notes (Signed)
? ?Subjective:  ? ?By signing my name below, I, [name], attest that this documentation has been prepared under the direction and in the presence of [name], DO. 08/21/2021 ? ? ? Patient ID: Jo Compton, female    DOB: 1965-11-08, 56 y.o.   MRN: [id number] ? ?Chief Complaint  ?Patient presents with  ? Breast Pain  ?  X1 week, right breast, some pain with touch, no redness or swelling, no discharge from the nipple.  ? ? ?HPI ?Patient is in today for a office visit,  ? ?She complains of right lower breast pain. Her pain worsens at night while laying down and taking her bar off. She denies having any discharge, swelling, or redness. Her last mammogram was June 2022 and results were normal.  ? ? ?Past Medical History:  ?Diagnosis Date  ? Anal fissure   ? Anemia   ? Anxiety   ? no meds taken  ? Arthritis   ? Depression   ? Fistula   ? rectal  ? Hx of adenomatous colonic polyps 08/22/2019  ? OAB (overactive bladder)   ? Rectal bleeding   ? ? ?Past Surgical History:  ?Procedure Laterality Date  ? ABDOMINAL HYSTERECTOMY Bilateral 03/20/2015  ? Procedure: HYSTERECTOMY ABDOMINAL, EXCISION MESENTERIC NODULE, BLADDER FLAP BIOPSY;  Surgeon: Ena Dawley, MD;  Location: [facility];  Service: Gynecology;  Laterality: Bilateral;  ? APPENDECTOMY    ? BILATERAL SALPINGECTOMY Bilateral 03/20/2015  ? Procedure: BILATERAL SALPINGECTOMY;  Surgeon: Ena Dawley, MD;  Location: [facility];  Service: Gynecology;  Laterality: Bilateral;  ? COLONOSCOPY    ? CYSTOSCOPY N/A 03/20/2015  ? Procedure: CYSTOSCOPY;  Surgeon: Ena Dawley, MD;  Location: [facility];  Service: Gynecology;  Laterality: N/A;  ? LAPAROSCOPY N/A 03/20/2015  ? Procedure: LAPAROSCOPY DIAGNOSTIC;  Surgeon: Ena Dawley, MD;  Location: [facility];  Service: Gynecology;  Laterality: N/A;  ? ? ?Family History  ?Problem Relation Age of Onset  ? Hypertension Mother   ? COPD Mother   ? Lung cancer Mother   ? Colon cancer Maternal Uncle   ? Liver cancer Maternal  Uncle   ? Hypertension Maternal Aunt   ? Diabetes Maternal Aunt   ? Esophageal cancer Neg Hx   ? Rectal cancer Neg Hx   ? Stomach cancer Neg Hx   ? ? ?Social History  ? ?Socioeconomic History  ? Marital status: Married  ?  Spouse name: Not on file  ? Number of children: 1  ? Years of education: Not on file  ? Highest education level: Some college, no degree  ?Occupational History  ? 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  ?Tobacco Use  ? Smoking status: Never  ? Smokeless tobacco: Never  ?Vaping Use  ? Vaping Use: Never used  ?Substance and Sexual Activity  ? Alcohol use: Yes  ?  Alcohol/week: 1.0 standard drink  ?  Types: 1 Glasses of wine per week  ? Drug use: No  ? Sexual activity: Yes  ?  Birth control/protection: None  ?Other Topics Concern  ? Not on file  ?Social History Narrative  ? ** Merged History Encounter **  ?    ? ?Social Determinants of Health  ? ?Financial Resource Strain: Not on file  ?Food Insecurity: Not on file  ?Transportation Needs: Not on file  ?Physical Activity: Not on file  ?Stress: Not on file  ?Social Connections: Not on file  ?Intimate Partner Violence: Not on file  ? ? ?Outpatient Medications Prior  to Visit  ?Medication Sig Dispense Refill  ? azelastine (ASTELIN) 0.1 % nasal spray Place 1 spray into both nostrils 2 (two) times daily. Use in each nostril as directed 30 mL 12  ? dilTIAZem HCl POWD APPLY RECTALLY TWICE DAILY. 30 g 2  ? docusate sodium (COLACE) 100 MG capsule Take 1 capsule (100 mg total) by mouth 2 (two) times daily. 120 capsule 2  ? famotidine (PEPCID) 20 MG tablet TAKE 1 TABLET BY MOUTH TWICE A DAY 180 tablet 0  ? hydrochlorothiazide (HYDRODIURIL) 25 MG tablet TAKE 1 TABLET (25 MG TOTAL) BY MOUTH DAILY. 90 tablet 1  ? hydrocortisone (ANUSOL-HC) 2.5 % rectal cream Place 1 application. rectally daily as needed for hemorrhoids or anal itching. 30 g 0  ? ibuprofen (ADVIL) 800 MG tablet Take 1 tablet (800 mg total) by mouth every 8 (eight) hours as needed. 30  tablet 0  ? loratadine (CLARITIN) 10 MG tablet Take 1 tablet (10 mg total) by mouth daily. 30 tablet 11  ? ofloxacin (FLOXIN OTIC) 0.3 % OTIC solution Place 5 drops into the left ear daily. 5 mL 0  ? promethazine-dextromethorphan (PROMETHAZINE-DM) 6.25-15 MG/5ML syrup Take 5 mLs by mouth 4 (four) times daily as needed. 118 mL 0  ? rosuvastatin (CRESTOR) 10 MG tablet TAKE 1 TABLET BY MOUTH EVERY DAY 30 tablet 0  ? ?No facility-administered medications prior to visit.  ? ? ?No Known Allergies ? ?Review of Systems  ?Musculoskeletal:   ?     (+)right breast pain  ?Skin:   ?     (-)right breast discharge ?(-)right breast redness ?(-)right breast swelling  ? ?   ?Objective:  ?  ?Physical Exam ?Constitutional:   ?   General: She is not in acute distress. ?   Appearance: Normal appearance. She is not ill-appearing.  ?HENT:  ?   Head: Normocephalic and atraumatic.  ?   Right Ear: External ear normal.  ?   Left Ear: External ear normal.  ?Eyes:  ?   Extraocular Movements: Extraocular movements intact.  ?   Pupils: Pupils are equal, round, and reactive to light.  ?Cardiovascular:  ?   Rate and Rhythm: Normal rate and regular rhythm.  ?   Heart sounds: Normal heart sounds. No murmur heard. ?  No gallop.  ?Pulmonary:  ?   Effort: Pulmonary effort is normal. No respiratory distress.  ?   Breath sounds: Normal breath sounds. No wheezing or rales.  ?Chest:  ?   Chest wall: Tenderness (with palpation) present. No mass or swelling.  ?Skin: ?   General: Skin is warm and dry.  ?Neurological:  ?   Mental Status: She is alert and oriented to person, place, and time.  ?Psychiatric:     ?   Judgment: Judgment normal.  ? ? ?BP 108/88 (BP Location: Left Arm, Patient Position: Sitting, Cuff Size: Large)   Pulse 69   Temp 98 ?F (36.7 ?C) (Oral)   Resp 18   Ht '5\' 7"'$  (1.702 m)   Wt 225 lb 3.2 oz (102.2 kg)   LMP 02/14/2015 (Approximate)   SpO2 99%   BMI 35.27 kg/m?  ?Wt Readings from Last 3 Encounters:  ?08/21/21 225 lb 3.2 oz (102.2 kg)   ?08/08/21 221 lb 12.8 oz (100.6 kg)  ?06/11/21 219 lb 8 oz (99.6 kg)  ? ? ?Diabetic Foot Exam - Simple   ?No data filed ?  ? ?Lab Results  ?Component Value Date  ? WBC 4.7 08/08/2021  ?  HGB 14.4 08/08/2021  ? HCT 44.4 08/08/2021  ? PLT 254.0 08/08/2021  ? GLUCOSE 88 08/08/2021  ? CHOL 236 (H) 08/08/2021  ? TRIG 124.0 08/08/2021  ? HDL 54.30 08/08/2021  ? LDLCALC 157 (H) 08/08/2021  ? ALT 26 08/08/2021  ? AST 22 08/08/2021  ? NA 137 08/08/2021  ? K 4.1 08/08/2021  ? CL 103 08/08/2021  ? CREATININE 0.81 08/08/2021  ? BUN 11 08/08/2021  ? CO2 28 08/08/2021  ? TSH 0.46 08/08/2021  ? HGBA1C 6.1 10/14/2020  ? MICROALBUR 0.1 06/07/2012  ? ? ?Lab Results  ?Component Value Date  ? TSH 0.46 08/08/2021  ? ?Lab Results  ?Component Value Date  ? WBC 4.7 08/08/2021  ? HGB 14.4 08/08/2021  ? HCT 44.4 08/08/2021  ? MCV 76.9 (L) 08/08/2021  ? PLT 254.0 08/08/2021  ? ?Lab Results  ?Component Value Date  ? NA 137 08/08/2021  ? K 4.1 08/08/2021  ? CO2 28 08/08/2021  ? GLUCOSE 88 08/08/2021  ? BUN 11 08/08/2021  ? CREATININE 0.81 08/08/2021  ? BILITOT 0.5 08/08/2021  ? ALKPHOS 58 08/08/2021  ? AST 22 08/08/2021  ? ALT 26 08/08/2021  ? PROT 6.8 08/08/2021  ? ALBUMIN 4.3 08/08/2021  ? CALCIUM 9.9 08/08/2021  ? ANIONGAP 10 05/18/2019  ? GFR 82.67 08/08/2021  ? ?Lab Results  ?Component Value Date  ? CHOL 236 (H) 08/08/2021  ? ?Lab Results  ?Component Value Date  ? HDL 54.30 08/08/2021  ? ?Lab Results  ?Component Value Date  ? LDLCALC 157 (H) 08/08/2021  ? ?Lab Results  ?Component Value Date  ? TRIG 124.0 08/08/2021  ? ?Lab Results  ?Component Value Date  ? CHOLHDL 4 08/08/2021  ? ?Lab Results  ?Component Value Date  ? HGBA1C 6.1 10/14/2020  ? ? ?   ?Assessment & Plan:  ? ?Problem List Items Addressed This Visit   ? ?  ? Unprioritized  ? Morbid obesity ([place])  ?  wegovy rx ?F/u 2-3 months  ? ?  ?  ? Relevant Medications  ? Semaglutide-Weight Management (WEGOVY) 0.25 MG/0.5ML SOAJ  ? Breast pain, right - Primary  ?  Diagnostic mammogram  ? ?   ?  ? Relevant Orders  ? MM Digital Diagnostic Bilat  ? US BREAST COMPLETE UNI RIGHT INC AXILLA  ? ? ? ?Meds ordered this encounter  ?Medications  ? Semaglutide-Weight Management (WEGOVY) 0.25 MG/0.5ML SOAJ  ?  Sig:

## 2021-08-21 NOTE — Assessment & Plan Note (Signed)
wegovy rx ?F/u 2-3 months  ?

## 2021-08-21 NOTE — Patient Instructions (Signed)
Breast Tenderness Breast tenderness is a common problem for women of all ages, but may also occur in men. Breast tenderness may range from mild discomfort to severe pain. In women, the pain usually comes and goes with the menstrual cycle, but it can also be constant. Breast tenderness has many possible causes, including hormone changes, infections, and taking certain medicines. You may have tests, such as a mammogram or an ultrasound, to check for any unusual findings. Having breast tenderness usually does not mean that you have breast cancer. Follow these instructions at home: Managing pain and discomfort  If directed, put ice to the painful area. To do this: Put ice in a plastic bag. Place a towel between your skin and the bag. Leave the ice on for 20 minutes, 2-3 times a day. Wear a supportive bra, especially during exercise. You may also want to wear a supportive bra while sleeping if your breasts are very tender. Medicines Take over-the-counter and prescription medicines only as told by your health care provider. If the cause of your pain is infection, you may be prescribed an antibiotic medicine. If you were prescribed an antibiotic, take it as told by your health care provider. Do not stop taking the antibiotic even if you start to feel better. Eating and drinking Your health care provider may recommend that you lessen the amount of fat in your diet. You can do this by: Limiting fried foods. Cooking foods using methods such as baking, boiling, grilling, and broiling. Decrease the amount of caffeine in your diet. Instead, drink more water and choose caffeine-free drinks. General instructions  Keep a log of the days and times when your breasts are most tender. Ask your health care provider how to do breast exams at home. This will help you notice if you have an unusual growth or lump. Keep all follow-up visits as told by your health care provider. This is important. Contact a health care  provider if: Any part of your breast is hard, red, and hot to the touch. This may be a sign of infection. You are a woman and: Not breastfeeding and you have fluid, especially blood or pus, coming out of your nipples. Have a new or painful lump in your breast that remains after your menstrual period ends. You have a fever. Your pain does not improve or it gets worse. Your pain is interfering with your daily activities. Summary Breast tenderness may range from mild discomfort to severe pain. Breast tenderness has many possible causes, including hormone changes, infections, and taking certain medicines. It can be treated with ice, wearing a supportive bra, and medicines. Make changes to your diet if told to by your health care provider. This information is not intended to replace advice given to you by your health care provider. Make sure you discuss any questions you have with your health care provider. Document Revised: 09/05/2018 Document Reviewed: 09/05/2018 Elsevier Patient Education  2023 Elsevier Inc.  

## 2021-08-22 ENCOUNTER — Encounter: Payer: Self-pay | Admitting: Physician Assistant

## 2021-08-22 ENCOUNTER — Ambulatory Visit: Payer: 59 | Admitting: Physician Assistant

## 2021-08-22 ENCOUNTER — Telehealth: Payer: Self-pay | Admitting: Family Medicine

## 2021-08-22 VITALS — BP 122/74 | HR 84 | Ht 67.0 in | Wt 224.0 lb

## 2021-08-22 DIAGNOSIS — K5904 Chronic idiopathic constipation: Secondary | ICD-10-CM

## 2021-08-22 DIAGNOSIS — K625 Hemorrhage of anus and rectum: Secondary | ICD-10-CM | POA: Diagnosis not present

## 2021-08-22 DIAGNOSIS — Z86010 Personal history of colonic polyps: Secondary | ICD-10-CM

## 2021-08-22 MED ORDER — LINACLOTIDE 145 MCG PO CAPS: 145.0000 ug | ORAL_CAPSULE | Freq: Every day | ORAL | 3 refills | Status: DC

## 2021-08-22 MED ORDER — DILTIAZEM GEL 2 %: 1.0000 "application " | Freq: Two times a day (BID) | CUTANEOUS | 1 refills | Status: DC

## 2021-08-22 NOTE — Patient Instructions (Addendum)
Toileting tips to help with your constipation ?- Drink at least 64-80 ounces of water/liquid per day. ?- Establish a time to try to move your bowels every day.  For many people, this is after a cup of coffee or after a meal such as breakfast. ?- Sit all of the way back on the toilet keeping your back fairly straight and while sitting up, try to rest the tops of your forearms on your upper thighs.   ?- Raising your feet with a step stool/squatty potty can be helpful to improve the angle that allows your stool to pass through the rectum. ?- Relax the rectum feeling it bulge toward the toilet water.  If you feel your rectum raising toward your body, you are contracting rather than relaxing. ?- Breathe in and slowly exhale. "Belly breath" by expanding your belly towards your belly button. Keep belly expanded as you gently direct pressure down and back to the anus.  A low pitched GRRR sound can assist with increasing intra-abdominal pressure.  ?- Repeat 3-4 times. If unsuccessful, contract the pelvic floor to restore normal tone and get off the toilet.  Avoid excessive straining. ?- To reduce excessive wiping by teaching your anus to normally contract, place hands on outer aspect of knees and resist knee movement outward.  Hold 5-10 second then place hands just inside of knees and resist inward movement of knees.  Hold 5 seconds.  Repeat a few times each way. ? ?Linzess  ?Take at least 30 minutes before the first meal of the day on an empty stomach ?You can have a loose stool if you eat a high-fat breakfast. ?Give it at least 4 days, may have more bowel movements during that time.  ?If you continue to have severe diarrhea try every other day, if you get AB pain with it stop.  ?After you are out we can send in a prescription if you did well, there is a prescription card ? ?FIBER SUPPLEMENT ?You can do metamucil or fibercon once or twice a day but if this causes gas/bloating please switch to Benefiber or Citracel.  ?Fiber  is good for constipation/diarrhea/irritable bowel syndrome.  ?It can also help with weight loss and can help lower your bad cholesterol.  ?Please do 1 TBSP in the morning in water, coffee, or tea. It can take up to a month before you can see a difference with your bowel movements.  ?It is cheapest from [employer], [employer], [employer].  ? ? ?Anal Fissure, Adult ? ?Diltiazem 2-3 x daily for 2 months sent to compound pharmacy #30 gram 1 refill   ? ?Follow up should symptoms persist for secondary evaluation and possible surgical referral for repair. ?An anal fissure is a small tear or crack in the skin around the anus. Bleeding from a fissure usually stops on its own within a few minutes. However, bleeding will often occur again with each bowel movement until the crack heals. ?CAUSES ?This condition may be caused by: ?Passing large, hard stool (feces). ?Frequent diarrhea. ?Constipation. ?Inflammatory bowel disease (Crohn disease or ulcerative colitis). ?Infections. ?Anal sex. ?SYMPTOMS ?Symptoms of this condition include: ?Bleeding from the rectum. ?Small amounts of blood seen on your stool, on toilet paper, or in the toilet after a bowel movement. ?Painful bowel movements. ?Itching or irritation around the anus. ?DIAGNOSIS  ?A health care provider may diagnose this condition by closely examining the anal area. An anal fissure can usually be seen with careful inspection. In some cases, a rectal exam may  be performed, or a short tube (anoscope) may be used to examine the anal canal. ?TREATMENT ?Treatment for this condition may include: ?Taking steps to avoid constipation. This may include making changes to your diet, such as increasing your intake of fiber or fluid. ?Taking fiber supplements. These supplements can soften your stool to help make bowel movements easier. Your health care provider may also prescribe a stool softener if your stool is often hard. ?Taking sitz baths. This may help to heal the tear. ?Using medicated  creams or ointments. These may be prescribed to lessen discomfort. ?HOME CARE INSTRUCTIONS ?Eating and Drinking ?Avoid foods that may be constipating, such as bananas and dairy products. ?Drink enough fluid to keep your urine clear or pale yellow. ?Maintain a diet that is high in fruits, whole grains, and vegetables. ?General Instructions ?Keep the anal area as clean and dry as possible. ?Take sitz baths as told by your health care provider. Do not use soap in the sitz baths. ?Take over-the-counter and prescription medicines only as told by your health care provider. ?Use creams or ointments only as told by your health care provider. ?Keep all follow-up visits as told by your health care provider. This is important. ?SEEK MEDICAL CARE IF: ?You have more bleeding. ?You have a fever. ?You have diarrhea that is mixed with blood. ?You continue to have pain. ?Your problem is getting worse rather than better. ?  ?This information is not intended to replace advice given to you by your health care provider. Make sure you discuss any questions you have with your health care provider. ?  ?Document Released: 04/13/2005 Document Revised: 01/02/2015 Document Reviewed: 07/09/2014 ?Elsevier Interactive Patient Education ?2016 [employer]. ? ?

## 2021-08-22 NOTE — Telephone Encounter (Signed)
Pharmacy comment: Alternative Requested:[facility] NOT ON [facility]. ?

## 2021-08-22 NOTE — Telephone Encounter (Signed)
Pt states the office she was sent to for her diagnostic mammogram does not accept her insurance.  ?

## 2021-08-25 ENCOUNTER — Other Ambulatory Visit: Payer: Self-pay | Admitting: Family Medicine

## 2021-08-25 DIAGNOSIS — N644 Mastodynia: Secondary | ICD-10-CM

## 2021-08-29 ENCOUNTER — Other Ambulatory Visit: Payer: Self-pay | Admitting: Family Medicine

## 2021-08-29 DIAGNOSIS — H9202 Otalgia, left ear: Secondary | ICD-10-CM

## 2021-08-29 DIAGNOSIS — J302 Other seasonal allergic rhinitis: Secondary | ICD-10-CM

## 2021-09-01 NOTE — Telephone Encounter (Signed)
Pt states she contacted [facility] regarding referral ? ?Facility states they did not have a referral. Please advise  ?

## 2021-09-17 ENCOUNTER — Ambulatory Visit: Payer: 59 | Admitting: Orthopaedic Surgery

## 2021-09-18 LAB — HM MAMMOGRAPHY

## 2021-10-22 ENCOUNTER — Ambulatory Visit: Payer: 59 | Admitting: Internal Medicine

## 2021-12-06 ENCOUNTER — Other Ambulatory Visit: Payer: Self-pay | Admitting: Family Medicine

## 2021-12-06 DIAGNOSIS — R030 Elevated blood-pressure reading, without diagnosis of hypertension: Secondary | ICD-10-CM

## 2021-12-06 DIAGNOSIS — R600 Localized edema: Secondary | ICD-10-CM

## 2022-01-23 IMAGING — MG MM DIGITAL SCREENING BILAT W/ TOMO AND CAD
8 series · 8 of 24 positions shown · non-contrast
Comparison: Previous exam(s).

ACR Breast Density Category a: The breast tissue is almost entirely
fatty.

CLINICAL DATA: Screening.

EXAM:
DIGITAL SCREENING BILATERAL MAMMOGRAM WITH TOMOSYNTHESIS AND CAD
TECHNIQUE: Bilateral screening digital craniocaudal and mediolateral oblique
mammograms were obtained. Bilateral screening digital breast
tomosynthesis was performed. The images were evaluated with
computer-aided detection.

[L MLO synth-2D]
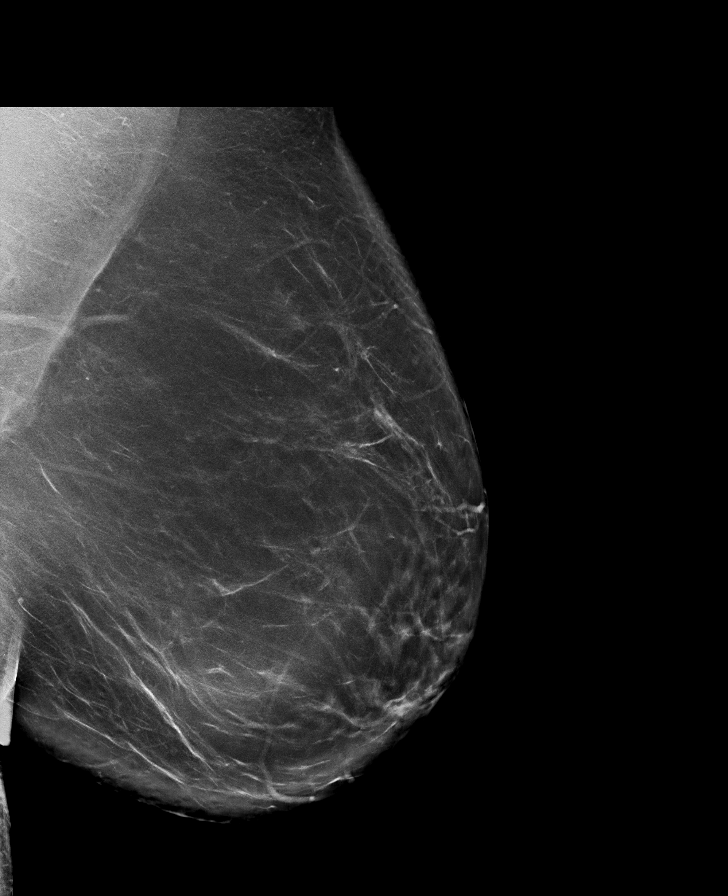

[R CC synth-2D]
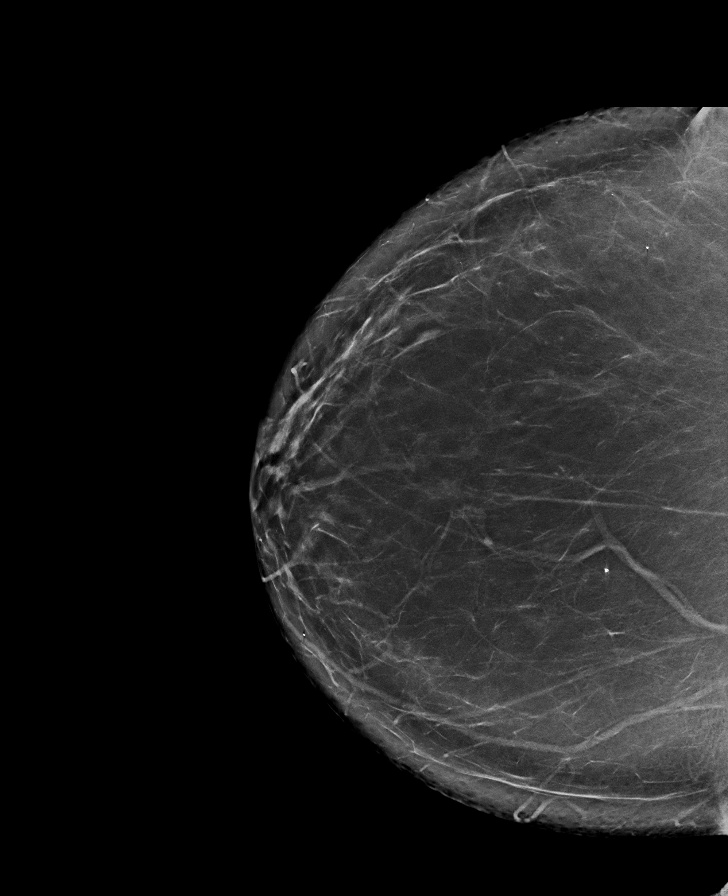

[R MLO synth-2D]
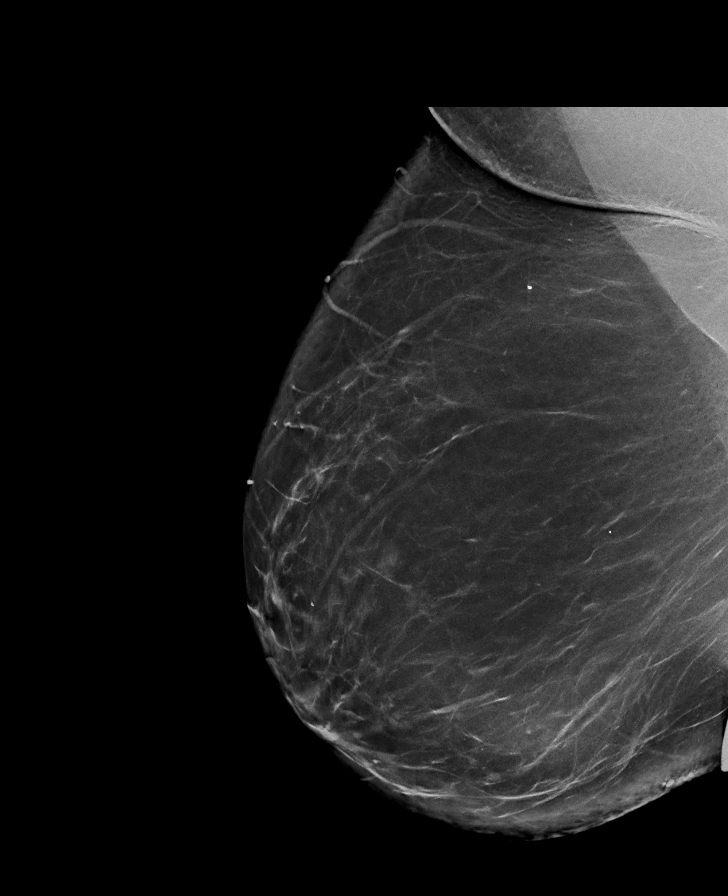

[L CC synth-2D]
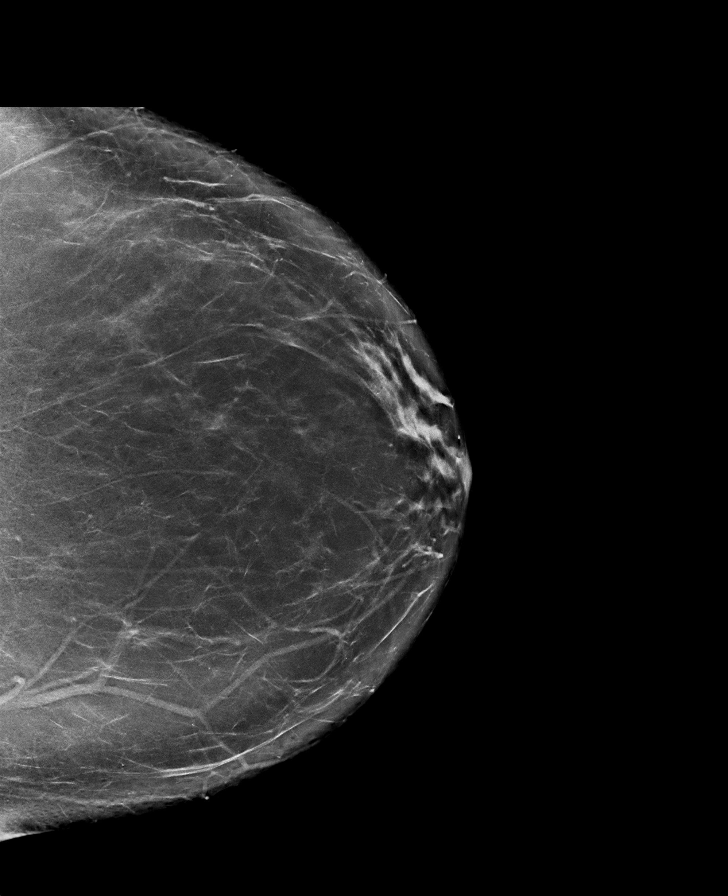

[R MLO tomo · tomo slice 55/108.0]
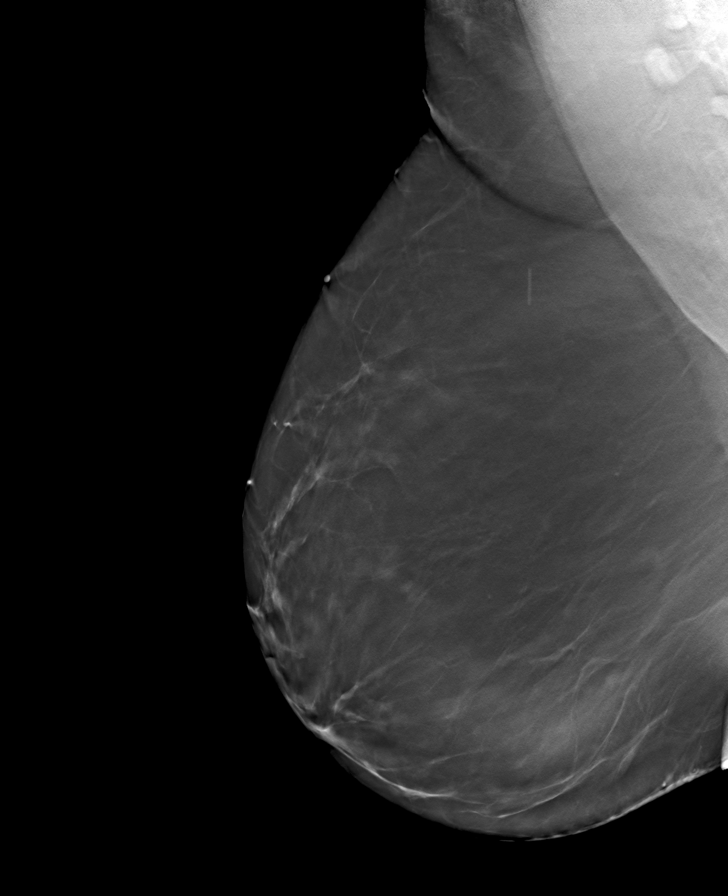

[R CC tomo · tomo slice 48/95.0]
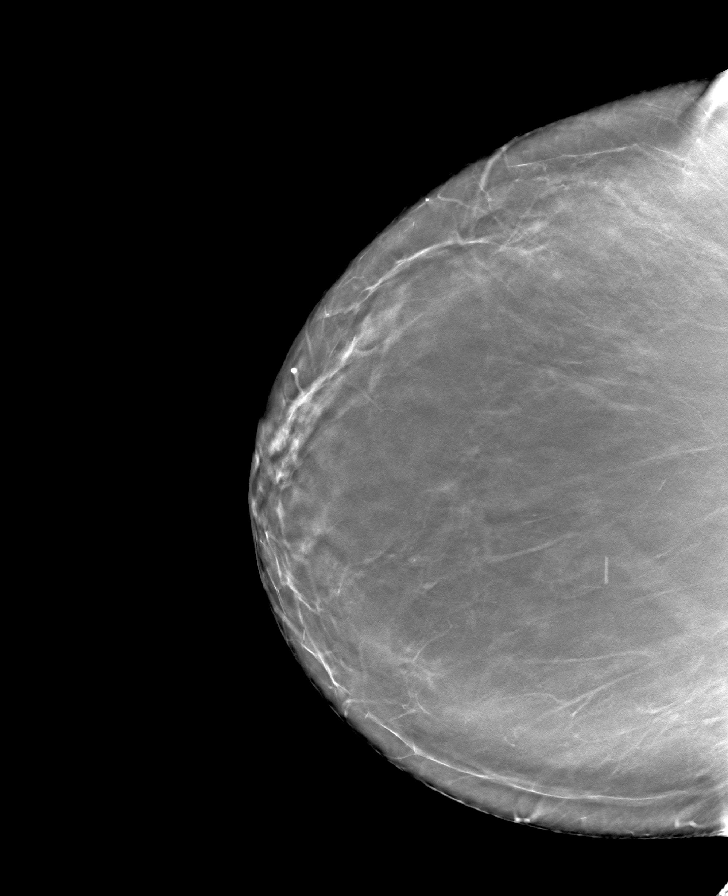

[L MLO tomo · tomo slice 55/108.0]
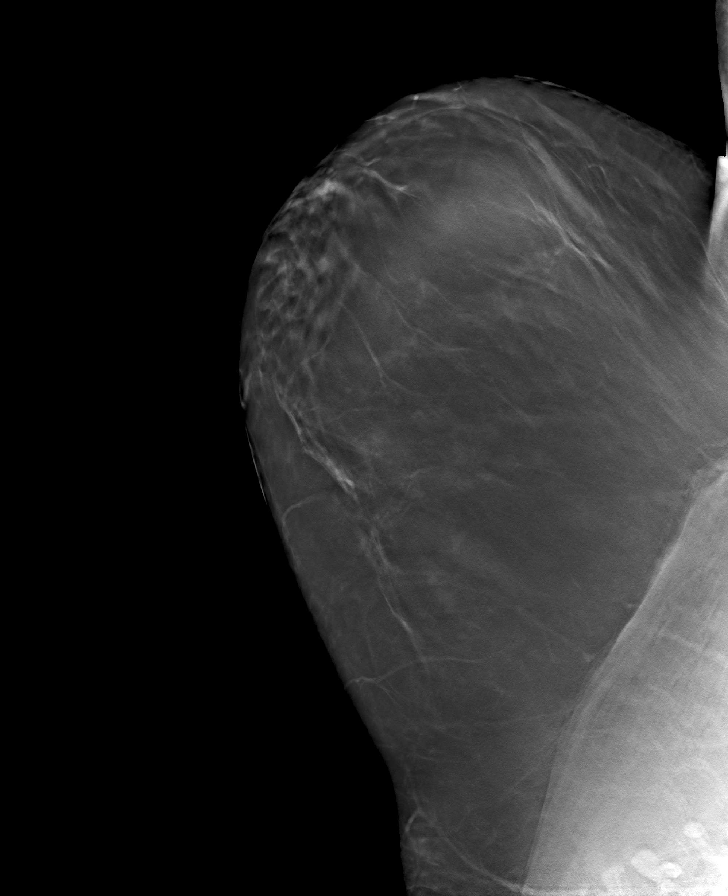

[L CC tomo · tomo slice 48/95.0]
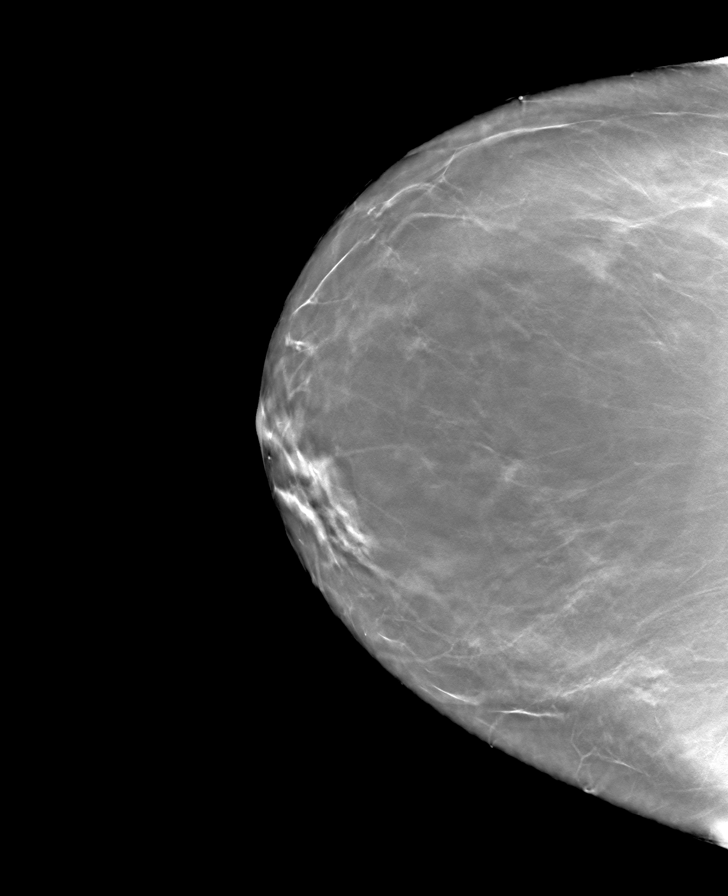

[8 of 24 positions shown; findings below may reference images not displayed]

FINDINGS: There are no findings suspicious for malignancy. The images were
evaluated with computer-aided detection.
IMPRESSION: No mammographic evidence of malignancy. A result letter of this
screening mammogram will be mailed directly to the patient.

RECOMMENDATION:
Screening mammogram in one year. (Code:[id number])

BI-RADS CATEGORY  1: Negative.

## 2022-04-09 ENCOUNTER — Ambulatory Visit (INDEPENDENT_AMBULATORY_CARE_PROVIDER_SITE_OTHER): Payer: 59 | Admitting: Family Medicine

## 2022-04-09 ENCOUNTER — Encounter: Payer: Self-pay | Admitting: Family Medicine

## 2022-04-09 VITALS — BP 120/88 | HR 72 | Temp 98.2°F | Resp 18 | Ht 67.0 in | Wt 225.0 lb

## 2022-04-09 DIAGNOSIS — R0602 Shortness of breath: Secondary | ICD-10-CM

## 2022-04-09 DIAGNOSIS — R1013 Epigastric pain: Secondary | ICD-10-CM | POA: Diagnosis not present

## 2022-04-09 DIAGNOSIS — R030 Elevated blood-pressure reading, without diagnosis of hypertension: Secondary | ICD-10-CM

## 2022-04-09 LAB — CBC WITH DIFFERENTIAL/PLATELET
Basophils Absolute: 0 10*3/uL (ref 0.0–0.1)
Basophils Relative: 0.1 % (ref 0.0–3.0)
Eosinophils Absolute: 0.1 10*3/uL (ref 0.0–0.7)
Eosinophils Relative: 1.2 % (ref 0.0–5.0)
HCT: 42.3 % (ref 36.0–46.0)
Hemoglobin: 13.8 g/dL (ref 12.0–15.0)
Lymphocytes Relative: 36.3 % (ref 12.0–46.0)
Lymphs Abs: 1.8 10*3/uL (ref 0.7–4.0)
MCHC: 32.7 g/dL (ref 30.0–36.0)
MCV: 76.3 fl — ABNORMAL LOW (ref 78.0–100.0)
Monocytes Absolute: 0.4 10*3/uL (ref 0.1–1.0)
Monocytes Relative: 8.9 % (ref 3.0–12.0)
Neutro Abs: 2.7 10*3/uL (ref 1.4–7.7)
Neutrophils Relative %: 53.5 % (ref 43.0–77.0)
Platelets: 316 10*3/uL (ref 150.0–400.0)
RBC: 5.54 Mil/uL — ABNORMAL HIGH (ref 3.87–5.11)
RDW: 14.2 % (ref 11.5–15.5)
WBC: 5 10*3/uL (ref 4.0–10.5)

## 2022-04-09 LAB — COMPREHENSIVE METABOLIC PANEL
ALT: 50 U/L — ABNORMAL HIGH (ref 0–35)
AST: 27 U/L (ref 0–37)
Albumin: 4.2 g/dL (ref 3.5–5.2)
Alkaline Phosphatase: 62 U/L (ref 39–117)
BUN: 10 mg/dL (ref 6–23)
CO2: 30 mEq/L (ref 19–32)
Calcium: 9.5 mg/dL (ref 8.4–10.5)
Chloride: 104 mEq/L (ref 96–112)
Creatinine, Ser: 0.9 mg/dL (ref 0.40–1.20)
GFR: 72.51 mL/min (ref >60.00)
Glucose, Bld: 94 mg/dL (ref 70–99)
Potassium: 3.7 mEq/L (ref 3.5–5.1)
Sodium: 139 mEq/L (ref 135–145)
Total Bilirubin: 0.3 mg/dL (ref 0.2–1.2)
Total Protein: 6.6 g/dL (ref 6.0–8.3)

## 2022-04-09 LAB — LIPID PANEL
Cholesterol: 124 mg/dL (ref 0–200)
HDL: 34.2 mg/dL — ABNORMAL LOW (ref >39.00)
LDL Cholesterol: 69 mg/dL (ref 0–99)
NonHDL: 89.5
Total CHOL/HDL Ratio: 4
Triglycerides: 104 mg/dL (ref 0.0–149.0)
VLDL: 20.8 mg/dL (ref 0.0–40.0)

## 2022-04-09 LAB — VITAMIN D 25 HYDROXY (VIT D DEFICIENCY, FRACTURES): VITD: 26.57 ng/mL — ABNORMAL LOW (ref 30.00–100.00)

## 2022-04-09 LAB — H. PYLORI ANTIBODY, IGG: H Pylori IgG: NEGATIVE

## 2022-04-09 LAB — VITAMIN B12: Vitamin B-12: 191 pg/mL — ABNORMAL LOW (ref 211–911)

## 2022-04-09 LAB — TSH: TSH: 1.08 u[IU]/mL (ref 0.35–5.50)

## 2022-04-09 MED ORDER — OMEPRAZOLE 20 MG PO CPDR: 20.0000 mg | DELAYED_RELEASE_CAPSULE | Freq: Every day | ORAL | 3 refills | Status: AC

## 2022-04-09 MED ORDER — HYDROCHLOROTHIAZIDE 12.5 MG PO CAPS: 12.5000 mg | ORAL_CAPSULE | Freq: Every day | ORAL | 3 refills | Status: DC

## 2022-04-09 NOTE — Patient Instructions (Signed)
Food Choices for Gastroesophageal Reflux Disease, Adult When you have gastroesophageal reflux disease (GERD), the foods you eat and your eating habits are very important. Choosing the right foods can help ease the discomfort of GERD. Consider working with a dietitian to help you make healthy food choices. What are tips for following this plan? Reading food labels Look for foods that are low in saturated fat. Foods that have less than 5% of daily value (DV) of fat and 0 g of trans fats may help with your symptoms. Cooking Cook foods using methods other than frying. This may include baking, steaming, grilling, or broiling. These are all methods that do not need a lot of fat for cooking. To add flavor, try to use herbs that are low in spice and acidity. Meal planning  Choose healthy foods that are low in fat, such as fruits, vegetables, whole grains, low-fat dairy products, lean meats, fish, and poultry. Eat frequent, small meals instead of three large meals each day. Eat your meals slowly, in a relaxed setting. Avoid bending over or lying down until 2-3 hours after eating. Limit high-fat foods such as fatty meats or fried foods. Limit your intake of fatty foods, such as oils, butter, and shortening. Avoid the following as told by your health care provider: Foods that cause symptoms. These may be different for different people. Keep a food diary to keep track of foods that cause symptoms. Alcohol. Drinking large amounts of liquid with meals. Eating meals during the 2-3 hours before bed. Lifestyle Maintain a healthy weight. Ask your health care provider what weight is healthy for you. If you need to lose weight, work with your health care provider to do so safely. Exercise for at least 30 minutes on 5 or more days each week, or as told by your health care provider. Avoid wearing clothes that fit tightly around your waist and chest. Do not use any products that contain nicotine or tobacco. These  products include cigarettes, chewing tobacco, and vaping devices, such as e-cigarettes. If you need help quitting, ask your health care provider. Sleep with the head of your bed raised. Use a wedge under the mattress or blocks under the bed frame to raise the head of the bed. Chew sugar-free gum after mealtimes. What foods should I eat?  Eat a healthy, well-balanced diet of fruits, vegetables, whole grains, low-fat dairy products, lean meats, fish, and poultry. Each person is different. Foods that may trigger symptoms in one person may not trigger any symptoms in another person. Work with your health care provider to identify foods that are safe for you. The items listed above may not be a complete list of recommended foods and beverages. Contact a dietitian for more information. What foods should I avoid? Limiting some of these foods may help manage the symptoms of GERD. Everyone is different. Consult a dietitian or your health care provider to help you identify the exact foods to avoid, if any. Fruits Any fruits prepared with added fat. Any fruits that cause symptoms. For some people this may include citrus fruits, such as oranges, grapefruit, pineapple, and lemons. Vegetables Deep-fried vegetables. French fries. Any vegetables prepared with added fat. Any vegetables that cause symptoms. For some people, this may include tomatoes and tomato products, chili peppers, onions and garlic, and horseradish. Grains Pastries or quick breads with added fat. Meats and other proteins High-fat meats, such as fatty beef or pork, hot dogs, ribs, ham, sausage, salami, and bacon. Fried meat or protein, including   fried fish and fried chicken. Nuts and nut butters, in large amounts. Dairy Whole milk and chocolate milk. Sour cream. Cream. Ice cream. Cream cheese. Milkshakes. Fats and oils Butter. Margarine. Shortening. Ghee. Beverages Coffee and tea, with or without caffeine. Carbonated beverages. Sodas. Energy  drinks. Fruit juice made with acidic fruits, such as orange or grapefruit. Tomato juice. Alcoholic drinks. Sweets and desserts Chocolate and cocoa. Donuts. Seasonings and condiments Pepper. Peppermint and spearmint. Added salt. Any condiments, herbs, or seasonings that cause symptoms. For some people, this may include curry, hot sauce, or vinegar-based salad dressings. The items listed above may not be a complete list of foods and beverages to avoid. Contact a dietitian for more information. Questions to ask your health care provider Diet and lifestyle changes are usually the first steps that are taken to manage symptoms of GERD. If diet and lifestyle changes do not improve your symptoms, talk with your health care provider about taking medicines. Where to find more information [employer] G[employer]intestinal [employer]: aboutgerd.org Summary When you have gastroesophageal reflux disease (GERD), food and lifestyle choices may be very helpful in easing the discomfort of GERD. Eat frequent, small meals instead of three large meals each day. Eat your meals slowly, in a relaxed setting. Avoid bending over or lying down until 2-3 hours after eating. Limit high-fat foods such as fatty meats or fried foods. This information is not intended to replace advice given to you by your health care provider. Make sure you discuss any questions you have with your health care provider. Document Revised: 10/23/2019 Document Reviewed: 10/23/2019 Elsevier Patient Education  2023 Elsevier Inc.  

## 2022-04-09 NOTE — Progress Notes (Signed)
Subjective:   By signing my name below, I, [name], attest that this documentation has been prepared under the direction and in the presence of [name], DO. 04/09/2022   Patient ID: Jo Compton, female    DOB: 21-Sep-1965, 56 y.o.   MRN: [id number]  Chief Complaint  Patient presents with   Nausea    Pt states sxs started last week. Pt thought it was food poisoning and was having freq bowel movements     HPI Patient is in today for a office visit.  She complains of nausea, frequent bowel movements since last week. She notes having reflux for the past 2 weeks since she ate at a restaurant. Her husband also felt similar symptoms after eating there but his symptoms are resolved at this time. She denies having any diarrhea or vomiting. She also finds it more difficulty to catch her breathe while eating. Her symptoms are improving. She has started taking OTC omeprazole since yesterday and finds her symptoms improved. She has a colonoscopy and reports they found and removed a non-cancerous polyp. She is interested in seeing her Gi specialist to further evaluate her symptoms.  She also complains of headaches for a while. Her blood pressure is doing well during this visit. She continues taking 25 mg hydrochlorothiazide and reports not taking it consistently. She is requesting a refill on it. She notes her blood pressure was running around 200/99 during her most recent headache.  BP Readings from Last 3 Encounters:  04/09/22 120/88  08/22/21 122/74  08/21/21 108/88   Pulse Readings from Last 3 Encounters:  04/09/22 72  08/22/21 84  08/21/21 69   She switched to Astelin nasal spray from Flonase and is requesting to switch back. She is UTD on flu vaccines this year and received it on October 2023 at her workplace.    Past Medical History:  Diagnosis Date   Anal fissure    Anemia    Anxiety    no meds taken   Arthritis    Depression    Fistula    rectal   Hx of  adenomatous colonic polyps 08/22/2019   OAB (overactive bladder)    Rectal bleeding     Past Surgical History:  Procedure Laterality Date   ABDOMINAL HYSTERECTOMY Bilateral 03/20/2015   Procedure: HYSTERECTOMY ABDOMINAL, EXCISION MESENTERIC NODULE, BLADDER FLAP BIOPSY;  Surgeon: Ena Dawley, MD;  Location: [facility];  Service: Gynecology;  Laterality: Bilateral;   APPENDECTOMY     BILATERAL SALPINGECTOMY Bilateral 03/20/2015   Procedure: BILATERAL SALPINGECTOMY;  Surgeon: Ena Dawley, MD;  Location: [facility];  Service: Gynecology;  Laterality: Bilateral;   COLONOSCOPY     CYSTOSCOPY N/A 03/20/2015   Procedure: CYSTOSCOPY;  Surgeon: Ena Dawley, MD;  Location: [facility];  Service: Gynecology;  Laterality: N/A;   LAPAROSCOPY N/A 03/20/2015   Procedure: LAPAROSCOPY DIAGNOSTIC;  Surgeon: Ena Dawley, MD;  Location: [facility];  Service: Gynecology;  Laterality: N/A;    Family History  Problem Relation Age of Onset   Hypertension Mother    COPD Mother    Lung cancer Mother    Colon cancer Maternal Uncle    Liver cancer Maternal Uncle    Hypertension Maternal Aunt    Diabetes Maternal Aunt    Esophageal cancer Neg Hx    Rectal cancer Neg Hx    Stomach cancer Neg Hx     Social History   Socioeconomic History   Marital status: Married    Spouse name:  Not on file   Number of children: 1   Years of education: Not on file   Highest education level: Some college, no degree  Occupational History   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  Tobacco Use   Smoking status: Never   Smokeless tobacco: Never  Vaping Use   Vaping Use: Never used  Substance and Sexual Activity   Alcohol use: Yes    Alcohol/week: 1.0 standard drink of alcohol    Types: 1 Glasses of wine per week   Drug use: No   Sexual activity: Yes    Birth control/protection: None  Other Topics Concern   Not on file  Social History Narrative   ** Merged History Encounter **       Social Determinants  of Health   Financial Resource Strain: Not on file  Food Insecurity: Not on file  Transportation Needs: Not on file  Physical Activity: Not on file  Stress: Not on file  Social Connections: Not on file  Intimate Partner Violence: Not on file    Outpatient Medications Prior to Visit  Medication Sig Dispense Refill   diltiazem 2 % GEL Apply 1 application. topically 2 (two) times daily. 30 g 1   docusate sodium (COLACE) 100 MG capsule Take 1 capsule (100 mg total) by mouth 2 (two) times daily. 120 capsule 2   hydrocortisone (ANUSOL-HC) 2.5 % rectal cream Place 1 application. rectally daily as needed for hemorrhoids or anal itching. 30 g 0   ibuprofen (ADVIL) 800 MG tablet Take 1 tablet (800 mg total) by mouth every 8 (eight) hours as needed. 30 tablet 0   linaclotide (LINZESS) 145 MCG CAPS capsule Take 1 capsule (145 mcg total) by mouth daily before breakfast. 90 capsule 3   loratadine (CLARITIN) 10 MG tablet TAKE 1 TABLET BY MOUTH EVERY DAY 90 tablet 3   ofloxacin (FLOXIN OTIC) 0.3 % OTIC solution Place 5 drops into the left ear daily. 5 mL 0   rosuvastatin (CRESTOR) 10 MG tablet TAKE 1 TABLET BY MOUTH EVERY DAY 30 tablet 0   Semaglutide-Weight Management (WEGOVY) 0.25 MG/0.5ML SOAJ Inject 0.25 mg into the skin once a week. 2 mL 0   azelastine (ASTELIN) 0.1 % nasal spray Place 1 spray into both nostrils 2 (two) times daily. Use in each nostril as directed 30 mL 12   hydrochlorothiazide (HYDRODIURIL) 25 MG tablet TAKE 1 TABLET (25 MG TOTAL) BY MOUTH DAILY. 90 tablet 1   No facility-administered medications prior to visit.    No Known Allergies  Review of Systems  Constitutional:  Negative for fever and malaise/fatigue.  HENT:  Negative for congestion.   Eyes:  Negative for blurred vision.  Respiratory:  Positive for shortness of breath (while eating). Negative for cough.   Cardiovascular:  Negative for chest pain, palpitations and leg swelling.  Gastrointestinal:  Positive for  heartburn and nausea. Negative for diarrhea and vomiting.  Musculoskeletal:  Negative for back pain.  Skin:  Negative for rash.  Neurological:  Positive for headaches. Negative for loss of consciousness.       Objective:    Physical Exam Vitals and nursing note reviewed.  Constitutional:      General: She is not in acute distress.    Appearance: Normal appearance. She is not ill-appearing.  HENT:     Head: Normocephalic and atraumatic.     Right Ear: External ear normal.     Left Ear: External ear normal.  Eyes:     Extraocular  Movements: Extraocular movements intact.     Pupils: Pupils are equal, round, and reactive to light.  Cardiovascular:     Rate and Rhythm: Normal rate and regular rhythm.     Heart sounds: Normal heart sounds. No murmur heard.    No gallop.  Pulmonary:     Effort: Pulmonary effort is normal. No respiratory distress.     Breath sounds: Normal breath sounds. No wheezing or rales.  Abdominal:     General: Bowel sounds are normal.     Palpations: Abdomen is soft.     Tenderness: There is no abdominal tenderness. There is no guarding or rebound.  Skin:    General: Skin is warm and dry.  Neurological:     Mental Status: She is alert and oriented to person, place, and time.  Psychiatric:        Judgment: Judgment normal.     BP 120/88 (BP Location: Left Arm, Patient Position: Sitting, Cuff Size: Large)   Pulse 72   Temp 98.2 F (36.8 C) (Oral)   Resp 18   Ht '5\' 7"'$  (1.702 m)   Wt 225 lb (102.1 kg)   LMP 02/14/2015 (Approximate)   SpO2 99%   BMI 35.24 kg/m  Wt Readings from Last 3 Encounters:  04/09/22 225 lb (102.1 kg)  08/22/21 224 lb (101.6 kg)  08/21/21 225 lb 3.2 oz (102.2 kg)       Assessment & Plan:  Dyspepsia -     Omeprazole; Take 1 capsule (20 mg total) by mouth daily.  Dispense: 30 capsule; Refill: 3 -     Ambulatory referral to Gastroenterology -     CBC with Differential/Platelet -     Lipid panel -     Comprehensive  metabolic panel -     TSH -     Vitamin B12 -     VITAMIN D 25 Hydroxy (Vit-D Deficiency, Fractures) -     H. pylori antibody, IgG  SOB (shortness of breath) -     DG Chest 2 View; Future -     CBC with Differential/Platelet -     Lipid panel -     Comprehensive metabolic panel -     TSH -     Vitamin B12 -     VITAMIN D 25 Hydroxy (Vit-D Deficiency, Fractures)  Elevated BP without diagnosis of hypertension -     hydroCHLOROthiazide; Take 1 capsule (12.5 mg total) by mouth daily.  Dispense: 90 capsule; Refill: 3    I, [name], DO, personally preformed the services described in this documentation.  All medical record entries made by the scribe were at my direction and in my presence.  I have reviewed the chart and discharge instructions (if applicable) and agree that the record reflects my personal performance and is accurate and complete. 04/09/2022   I,[name],acting as a scribe for [name], DO.,have documented all relevant documentation on the behalf of [name], DO,as directed by  [name], DO while in the presence of [name], DO.   [name], DO

## 2022-04-15 ENCOUNTER — Other Ambulatory Visit: Payer: Self-pay

## 2022-04-15 MED ORDER — CYANOCOBALAMIN 1000 MCG/ML IJ SOLN: 1000.0000 ug | INTRAMUSCULAR | 0 refills | Status: DC

## 2022-04-15 MED ORDER — "LUER LOCK SAFETY SYRINGES 25G X 1"" 3 ML MISC": 1 refills | Status: AC

## 2022-04-15 MED ORDER — VITAMIN D (ERGOCALCIFEROL) 1.25 MG (50000 UNIT) PO CAPS: 50000.0000 [IU] | ORAL_CAPSULE | ORAL | 1 refills | Status: DC

## 2022-04-22 ENCOUNTER — Ambulatory Visit: Payer: 59

## 2022-04-30 ENCOUNTER — Ambulatory Visit: Payer: 59 | Admitting: Family Medicine

## 2022-05-04 ENCOUNTER — Other Ambulatory Visit: Payer: Self-pay | Admitting: Family Medicine

## 2022-06-15 ENCOUNTER — Telehealth: Payer: Self-pay | Admitting: Physician Assistant

## 2022-06-15 DIAGNOSIS — K644 Residual hemorrhoidal skin tags: Secondary | ICD-10-CM

## 2022-06-15 DIAGNOSIS — K625 Hemorrhage of anus and rectum: Secondary | ICD-10-CM

## 2022-06-15 MED ORDER — DILTIAZEM GEL 2 %: 1.0000 | Freq: Two times a day (BID) | CUTANEOUS | 1 refills | Status: DC

## 2022-06-15 MED ORDER — HYDROCORTISONE (PERIANAL) 2.5 % EX CREA: 1.0000 | TOPICAL_CREAM | Freq: Every day | CUTANEOUS | 0 refills | Status: AC | PRN

## 2022-06-15 NOTE — Telephone Encounter (Signed)
Inbound call from patient, requesting to speak with a nurse in regards to some rectal bleeding she has been experiencing. Patient believes it is hemorrhoids so she would like refill of the compound cream medication. Patient was scheduled with Estill Bamberg on 3/27 but would like sooner appointment and refill sent in prior. Please advise.

## 2022-06-15 NOTE — Telephone Encounter (Signed)
Pt reports she is having rectal bleeding again and does not know if it is from fissure or hemorrhoids. Pt would like refill on diltiazem and hydrocortisone cream. Refills sent to pharmacy. Pt scheduled to see Alonza Bogus PA 06/25/22 at 1:30pm. Pt aware.

## 2022-06-16 ENCOUNTER — Ambulatory Visit (INDEPENDENT_AMBULATORY_CARE_PROVIDER_SITE_OTHER): Payer: PRIVATE HEALTH INSURANCE | Admitting: Family Medicine

## 2022-06-16 VITALS — BP 108/82 | HR 81 | Temp 97.9°F | Resp 18 | Ht 67.0 in | Wt 229.0 lb

## 2022-06-16 DIAGNOSIS — J0140 Acute pansinusitis, unspecified: Secondary | ICD-10-CM

## 2022-06-16 DIAGNOSIS — K644 Residual hemorrhoidal skin tags: Secondary | ICD-10-CM | POA: Diagnosis not present

## 2022-06-16 DIAGNOSIS — E538 Deficiency of other specified B group vitamins: Secondary | ICD-10-CM | POA: Diagnosis not present

## 2022-06-16 MED ORDER — WEGOVY 0.25 MG/0.5ML ~~LOC~~ SOAJ: 0.2500 mg | SUBCUTANEOUS | 0 refills | Status: DC

## 2022-06-16 MED ORDER — CYANOCOBALAMIN 1000 MCG/ML IJ SOLN
1000.0000 ug | Freq: Once | INTRAMUSCULAR | Status: AC
  Administered 2022-06-16: 1000 ug via INTRAMUSCULAR

## 2022-06-16 MED ORDER — AMOXICILLIN-POT CLAVULANATE 875-125 MG PO TABS: 1.0000 | ORAL_TABLET | Freq: Two times a day (BID) | ORAL | 0 refills | Status: DC

## 2022-06-16 NOTE — Progress Notes (Signed)
Subjective:   By signing my name below, I, [name], attest that this documentation has been prepared under the direction and in the presence of [name], DO. 06/16/2022   Patient ID: Jo Compton, female    DOB: 06-29-1965, 57 y.o.   MRN: [id number]  Chief Complaint  Patient presents with  . Sinus Problem    Pt states sxs started last week. Pt states having sinus pressure and ear pain. No cough or fever. No OTC meds.     Sinus Problem Associated symptoms include congestion. Pertinent negatives include no coughing, headaches or shortness of breath.   Patient is in today for a office visit.   She complains of pressure in her sinus when blowing her nose for the past 5 days. She denies having any cough or fever. She started taking OTC Claritin today. She has Flonase at home but stopped since her symptoms started.  She reports finding blood in her stool for the past couple days. She is also experiencing fatigue. She was supposed to receive B12 injections but rejected them. She is interested in receiving them now to help manage her symptoms. She has a hx of hemorrhoids and has an appointment to see GI but is concerned about her anemia.  Wishes to have this checked today.   She has skin tags on her upper back and is interested in having them removed.  She would also like to have the wegovy sent in again   Past Medical History:  Diagnosis Date  . Anal fissure   . Anemia   . Anxiety    no meds taken  . Arthritis   . Depression   . Fistula    rectal  . Hx of adenomatous colonic polyps 08/22/2019  . OAB (overactive bladder)   . Rectal bleeding     Past Surgical History:  Procedure Laterality Date  . ABDOMINAL HYSTERECTOMY Bilateral 03/20/2015   Procedure: HYSTERECTOMY ABDOMINAL, EXCISION MESENTERIC NODULE, BLADDER FLAP BIOPSY;  Surgeon: Ena Dawley, MD;  Location: [facility];  Service: Gynecology;  Laterality: Bilateral;  . APPENDECTOMY    . BILATERAL SALPINGECTOMY  Bilateral 03/20/2015   Procedure: BILATERAL SALPINGECTOMY;  Surgeon: Ena Dawley, MD;  Location: [facility];  Service: Gynecology;  Laterality: Bilateral;  . COLONOSCOPY    . CYSTOSCOPY N/A 03/20/2015   Procedure: CYSTOSCOPY;  Surgeon: Ena Dawley, MD;  Location: [facility];  Service: Gynecology;  Laterality: N/A;  . LAPAROSCOPY N/A 03/20/2015   Procedure: LAPAROSCOPY DIAGNOSTIC;  Surgeon: Ena Dawley, MD;  Location: [facility];  Service: Gynecology;  Laterality: N/A;    Family History  Problem Relation Age of Onset  . Hypertension Mother   . COPD Mother   . Lung cancer Mother   . Colon cancer Maternal Uncle   . Liver cancer Maternal Uncle   . Hypertension Maternal Aunt   . Diabetes Maternal Aunt   . Esophageal cancer Neg Hx   . Rectal cancer Neg Hx   . Stomach cancer Neg Hx     Social History   Socioeconomic History  . Marital status: Married    Spouse name: Not on file  . Number of children: 1  . Years of education: Not on file  . Highest education level: Some college, no degree  Occupational History  . 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 Smoking status: Never  . Smokeless tobacco: Never  Vaping Use  . Vaping Use: Never used  Substance and Sexual Activity  .  Alcohol use: Yes    Alcohol/week: 1.0 standard drink of alcohol    Types: 1 Glasses of wine per week  . Drug use: No  . Sexual activity: Yes    Birth control/protection: None  Other Topics Concern  . Not on file  Social History Narrative   ** Merged History Encounter **       Social Determinants of Health   Financial Resource Strain: Not on file  Food Insecurity: Not on file  Transportation Needs: Not on file  Physical Activity: Not on file  Stress: Not on file  Social Connections: Not on file  Intimate Partner Violence: Not on file    Outpatient Medications Prior to Visit  Medication Sig Dispense Refill  . cyanocobalamin (VITAMIN B12) 1000 MCG/ML injection INJECT 1  ML (1,000 MCG TOTAL) INTO THE MUSCLE ONCE A WEEK. INJECTIONS WEEKLY X4. THEN MONTHLY INJECTIONS. RECHECK B12 LEVELS IN 1 MONTH. 3 mL 1  . diltiazem 2 % GEL Apply 1 Application topically 2 (two) times daily. 30 g 1  . docusate sodium (COLACE) 100 MG capsule Take 1 capsule (100 mg total) by mouth 2 (two) times daily. 120 capsule 2  . hydrochlorothiazide (MICROZIDE) 12.5 MG capsule Take 1 capsule (12.5 mg total) by mouth daily. 90 capsule 3  . hydrocortisone (ANUSOL-HC) 2.5 % rectal cream Place 1 Application rectally daily as needed for hemorrhoids or anal itching. 30 g 0  . ibuprofen (ADVIL) 800 MG tablet Take 1 tablet (800 mg total) by mouth every 8 (eight) hours as needed. 30 tablet 0  . linaclotide (LINZESS) 145 MCG CAPS capsule Take 1 capsule (145 mcg total) by mouth daily before breakfast. 90 capsule 3  . loratadine (CLARITIN) 10 MG tablet TAKE 1 TABLET BY MOUTH EVERY DAY 90 tablet 3  . ofloxacin (FLOXIN OTIC) 0.3 % OTIC solution Place 5 drops into the left ear daily. 5 mL 0  . omeprazole (PRILOSEC) 20 MG capsule Take 1 capsule (20 mg total) by mouth daily. 30 capsule 3  . rosuvastatin (CRESTOR) 10 MG tablet TAKE 1 TABLET BY MOUTH EVERY DAY 30 tablet 0  . SYRINGE-NEEDLE, DISP, 3 ML (LUER LOCK SAFETY SYRINGES) 25G X 1" 3 ML MISC Use to inject B12 weekly for 4 weeks and then monthly 50 each 1  . Vitamin D, Ergocalciferol, (DRISDOL) 1.25 MG (50000 UNIT) CAPS capsule Take 1 capsule (50,000 Units total) by mouth every 7 (seven) days. 12 capsule 1  . Semaglutide-Weight Management (WEGOVY) 0.25 MG/0.5ML SOAJ Inject 0.25 mg into the skin once a week. 2 mL 0   No facility-administered medications prior to visit.    No Known Allergies  Review of Systems  Constitutional:  Positive for malaise/fatigue. Negative for fever.  HENT:  Positive for congestion and sinus pain (sinus pressure).   Eyes:  Negative for blurred vision.  Respiratory:  Negative for cough and shortness of breath.   Cardiovascular:   Negative for chest pain, palpitations and leg swelling.  Gastrointestinal:  Positive for blood in stool. Negative for abdominal pain and nausea.  Genitourinary:  Negative for dysuria and frequency.  Musculoskeletal:  Negative for falls.  Skin:  Negative for rash.  Neurological:  Negative for dizziness, loss of consciousness and headaches.  Endo/Heme/Allergies:  Negative for environmental allergies.  Psychiatric/Behavioral:  Negative for depression. The patient is not nervous/anxious.        Objective:    Physical Exam Vitals and nursing note reviewed.  Constitutional:      General: She is not  in acute distress.    Appearance: Normal appearance. She is obese. She is not ill-appearing.  HENT:     Head: Normocephalic and atraumatic.     Right Ear: Tympanic membrane, ear canal and external ear normal.     Left Ear: Tympanic membrane, ear canal and external ear normal.     Nose:     Right Sinus: Maxillary sinus tenderness and frontal sinus tenderness present.     Left Sinus: Maxillary sinus tenderness and frontal sinus tenderness present.     Mouth/Throat:     Mouth: Mucous membranes are moist.  Eyes:     Extraocular Movements: Extraocular movements intact.     Pupils: Pupils are equal, round, and reactive to light.  Cardiovascular:     Rate and Rhythm: Normal rate and regular rhythm.     Heart sounds: Normal heart sounds. No murmur heard.    No gallop.  Pulmonary:     Effort: Pulmonary effort is normal. No respiratory distress.     Breath sounds: Normal breath sounds. No wheezing or rales.  Skin:    General: Skin is warm and dry.  Neurological:     Mental Status: She is alert and oriented to person, place, and time.  Psychiatric:        Judgment: Judgment normal.    BP 108/82 (BP Location: Left Arm, Patient Position: Sitting, Cuff Size: Large)   Pulse 81   Temp 97.9 F (36.6 C) (Oral)   Resp 18   Ht 5' 7"$  (1.702 m)   Wt 229 lb (103.9 kg)   LMP 02/14/2015 (Approximate)    SpO2 99%   BMI 35.87 kg/m  Wt Readings from Last 3 Encounters:  06/16/22 229 lb (103.9 kg)  04/09/22 225 lb (102.1 kg)  08/22/21 224 lb (101.6 kg)       Assessment & Plan:  Acute non-recurrent pansinusitis Assessment & Plan: Abx per orders Flonase and antihistamine Return to office as needed   Orders: -     Amoxicillin-Pot Clavulanate; Take 1 tablet by mouth 2 (two) times daily.  Dispense: 20 tablet; Refill: 0  Morbid obesity ([place]) -     [id number]; Inject 0.25 mg into the skin once a week.  Dispense: 2 mL; Refill: 0  External hemorrhoid Assessment & Plan: Pt has f/u with GI  Will check labs today  Orders: -     CBC with Differential/Platelet -     Iron, TIBC and Ferritin Panel  B12 deficiency -     Cyanocobalamin    I, [name], DO, personally preformed the services described in this documentation.  All medical record entries made by the scribe were at my direction and in my presence.  I have reviewed the chart and discharge instructions (if applicable) and agree that the record reflects my personal performance and is accurate and complete. 06/16/2022   I,[name],acting as a scribe for [name], DO.,have documented all relevant documentation on the behalf of [name], DO,as directed by  [name], DO while in the presence of [name], DO.   [name], DO

## 2022-06-17 ENCOUNTER — Encounter: Payer: Self-pay | Admitting: Family Medicine

## 2022-06-17 LAB — CBC WITH DIFFERENTIAL/PLATELET
Basophils Absolute: 0 10*3/uL (ref 0.0–0.1)
Basophils Relative: 0.3 % (ref 0.0–3.0)
Eosinophils Absolute: 0.1 10*3/uL (ref 0.0–0.7)
Eosinophils Relative: 1.6 % (ref 0.0–5.0)
HCT: 44 % (ref 36.0–46.0)
Hemoglobin: 14.4 g/dL (ref 12.0–15.0)
Lymphocytes Relative: 34.8 % (ref 12.0–46.0)
Lymphs Abs: 2 10*3/uL (ref 0.7–4.0)
MCHC: 32.7 g/dL (ref 30.0–36.0)
MCV: 77.7 fl — ABNORMAL LOW (ref 78.0–100.0)
Monocytes Absolute: 0.5 10*3/uL (ref 0.1–1.0)
Monocytes Relative: 8 % (ref 3.0–12.0)
Neutro Abs: 3.2 10*3/uL (ref 1.4–7.7)
Neutrophils Relative %: 55.3 % (ref 43.0–77.0)
Platelets: 286 10*3/uL (ref 150.0–400.0)
RBC: 5.66 Mil/uL — ABNORMAL HIGH (ref 3.87–5.11)
RDW: 14.4 % (ref 11.5–15.5)
WBC: 5.8 10*3/uL (ref 4.0–10.5)

## 2022-06-17 LAB — IRON,TIBC AND FERRITIN PANEL
%SAT: 26 % (calc) (ref 16–45)
Ferritin: 110 ng/mL (ref 16–232)
Iron: 86 ug/dL (ref 45–160)
TIBC: 327 mcg/dL (calc) (ref 250–450)

## 2022-06-17 NOTE — Assessment & Plan Note (Signed)
Abx per orders Flonase and antihistamine Return to office as needed

## 2022-06-17 NOTE — Assessment & Plan Note (Signed)
Pt has f/u with GI  Will check labs today

## 2022-06-23 ENCOUNTER — Ambulatory Visit (INDEPENDENT_AMBULATORY_CARE_PROVIDER_SITE_OTHER): Payer: PRIVATE HEALTH INSURANCE | Admitting: Family Medicine

## 2022-06-23 ENCOUNTER — Encounter: Payer: Self-pay | Admitting: Family Medicine

## 2022-06-23 VITALS — BP 124/78 | HR 65 | Temp 98.3°F | Resp 18 | Ht 67.0 in

## 2022-06-23 DIAGNOSIS — L918 Other hypertrophic disorders of the skin: Secondary | ICD-10-CM | POA: Diagnosis not present

## 2022-06-23 DIAGNOSIS — E538 Deficiency of other specified B group vitamins: Secondary | ICD-10-CM | POA: Diagnosis not present

## 2022-06-23 MED ORDER — CYANOCOBALAMIN 1000 MCG/ML IJ SOLN
1000.0000 ug | Freq: Once | INTRAMUSCULAR | Status: AC
  Administered 2022-06-23: 1000 ug via INTRAMUSCULAR

## 2022-06-23 NOTE — Progress Notes (Addendum)
Subjective:   By signing my name below, I, Jo Compton, attest that this documentation has been prepared under the direction and in the presence of Jo Held, DO. 06/23/2022   Patient ID: Jo Compton, female    DOB: 1965-06-21, 57 y.o.   MRN: [id number]  Chief Complaint  Patient presents with   Procedure    Pt here to have 2 skin tags removed from the back of her neck     HPI Patient is in today for a office visit.   She complains of two moles on the back of her neck that bothers her. She is willing to have them removed during this visit. She reports having a mole on the the other side of her neck that is not bothering her at this time.    Past Medical History:  Diagnosis Date   Anal fissure    Anemia    Anxiety    no meds taken   Arthritis    Depression    Fistula    rectal   Hx of adenomatous colonic polyps 08/22/2019   OAB (overactive bladder)    Rectal bleeding     Past Surgical History:  Procedure Laterality Date   ABDOMINAL HYSTERECTOMY Bilateral 03/20/2015   Procedure: HYSTERECTOMY ABDOMINAL, EXCISION MESENTERIC NODULE, BLADDER FLAP BIOPSY;  Surgeon: Ena Dawley, MD;  Location: [facility];  Service: Gynecology;  Laterality: Bilateral;   APPENDECTOMY     BILATERAL SALPINGECTOMY Bilateral 03/20/2015   Procedure: BILATERAL SALPINGECTOMY;  Surgeon: Ena Dawley, MD;  Location: [facility];  Service: Gynecology;  Laterality: Bilateral;   COLONOSCOPY     CYSTOSCOPY N/A 03/20/2015   Procedure: CYSTOSCOPY;  Surgeon: Ena Dawley, MD;  Location: [facility];  Service: Gynecology;  Laterality: N/A;   LAPAROSCOPY N/A 03/20/2015   Procedure: LAPAROSCOPY DIAGNOSTIC;  Surgeon: Ena Dawley, MD;  Location: [facility];  Service: Gynecology;  Laterality: N/A;    Family History  Problem Relation Age of Onset   Hypertension Mother    COPD Mother    Lung cancer Mother    Colon cancer Maternal Uncle    Liver cancer Maternal Uncle    Hypertension Maternal Aunt     Diabetes Maternal Aunt    Esophageal cancer Neg Hx    Rectal cancer Neg Hx    Stomach cancer Neg Hx     Social History   Socioeconomic History   Marital status: Married    Spouse name: Not on file   Number of children: 1   Years of education: Not on file   Highest education level: Some college, no degree  Occupational History   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  Tobacco Use   Smoking status: Never   Smokeless tobacco: Never  Vaping Use   Vaping Use: Never used  Substance and Sexual Activity   Alcohol use: Yes    Alcohol/week: 1.0 standard drink of alcohol    Types: 1 Glasses of wine per week   Drug use: No   Sexual activity: Yes    Birth control/protection: None  Other Topics Concern   Not on file  Social History Narrative   ** Merged History Encounter **       Social Determinants of Health   Financial Resource Strain: Not on file  Food Insecurity: Not on file  Transportation Needs: Not on file  Physical Activity: Not on file  Stress: Not on file  Social Connections: Not on file  Intimate Partner Violence: Not on file  Outpatient Medications Prior to Visit  Medication Sig Dispense Refill   amoxicillin-clavulanate (AUGMENTIN) 875-125 MG tablet Take 1 tablet by mouth 2 (two) times daily. 20 tablet 0   cyanocobalamin (VITAMIN B12) 1000 MCG/ML injection INJECT 1 ML (1,000 MCG TOTAL) INTO THE MUSCLE ONCE A WEEK. INJECTIONS WEEKLY X4. THEN MONTHLY INJECTIONS. RECHECK B12 LEVELS IN 1 MONTH. 3 mL 1   docusate sodium (COLACE) 100 MG capsule Take 1 capsule (100 mg total) by mouth 2 (two) times daily. 120 capsule 2   hydrochlorothiazide (MICROZIDE) 12.5 MG capsule Take 1 capsule (12.5 mg total) by mouth daily. 90 capsule 3   hydrocortisone (ANUSOL-HC) 2.5 % rectal cream Place 1 Application rectally daily as needed for hemorrhoids or anal itching. 30 g 0   ibuprofen (ADVIL) 800 MG tablet Take 1 tablet (800 mg total) by mouth every 8 (eight) hours as needed.  30 tablet 0   linaclotide (LINZESS) 145 MCG CAPS capsule Take 1 capsule (145 mcg total) by mouth daily before breakfast. 90 capsule 3   loratadine (CLARITIN) 10 MG tablet TAKE 1 TABLET BY MOUTH EVERY DAY 90 tablet 3   ofloxacin (FLOXIN OTIC) 0.3 % OTIC solution Place 5 drops into the left ear daily. 5 mL 0   omeprazole (PRILOSEC) 20 MG capsule Take 1 capsule (20 mg total) by mouth daily. 30 capsule 3   rosuvastatin (CRESTOR) 10 MG tablet TAKE 1 TABLET BY MOUTH EVERY DAY 30 tablet 0   Semaglutide-Weight Management (WEGOVY) 0.25 MG/0.5ML SOAJ Inject 0.25 mg into the skin once a week. 2 mL 0   SYRINGE-NEEDLE, DISP, 3 ML (LUER LOCK SAFETY SYRINGES) 25G X 1" 3 ML MISC Use to inject B12 weekly for 4 weeks and then monthly 50 each 1   Vitamin D, Ergocalciferol, (DRISDOL) 1.25 MG (50000 UNIT) CAPS capsule Take 1 capsule (50,000 Units total) by mouth every 7 (seven) days. 12 capsule 1   diltiazem 2 % GEL Apply 1 Application topically 2 (two) times daily. 30 g 1   No facility-administered medications prior to visit.    No Known Allergies  Review of Systems  Constitutional:  Negative for fever and malaise/fatigue.  HENT:  Negative for congestion.   Eyes:  Negative for blurred vision.  Respiratory:  Negative for shortness of breath.   Cardiovascular:  Negative for chest pain, palpitations and leg swelling.  Gastrointestinal:  Negative for abdominal pain, blood in stool and nausea.  Genitourinary:  Negative for dysuria and frequency.  Musculoskeletal:  Negative for falls.  Skin:  Negative for rash.       (+)2 skin tags  on the back of neck   Neurological:  Negative for dizziness, loss of consciousness and headaches.  Endo/Heme/Allergies:  Negative for environmental allergies.  Psychiatric/Behavioral:  Negative for depression. The patient is not nervous/anxious.        Objective:    Physical Exam Vitals and nursing note reviewed.  Constitutional:      General: She is not in acute distress.     Appearance: Normal appearance. She is not ill-appearing.  HENT:     Head: Normocephalic and atraumatic.     Right Ear: External ear normal.     Left Ear: External ear normal.  Eyes:     Extraocular Movements: Extraocular movements intact.     Pupils: Pupils are equal, round, and reactive to light.  Cardiovascular:     Rate and Rhythm: Normal rate and regular rhythm.     Heart sounds: Normal heart sounds. No murmur  heard.    No gallop.  Pulmonary:     Effort: Pulmonary effort is normal. No respiratory distress.     Breath sounds: Normal breath sounds. No wheezing or rales.  Skin:    General: Skin is warm and dry.     Comments: 2 skins tags on back of neck One in the middle of the neck---  black in color and hard Written consent obtained Area was cleaned and draped in sterile fashion Betadine used  Zylocaine w/o epi 1 cc used to anesthetize both areas Larger skin tag cut off with derma blade and sent for bx Second removed same way and silver nitrate stick used to stop bleeding    Neurological:     General: No focal deficit present.     Mental Status: She is alert and oriented to person, place, and time.  Psychiatric:        Judgment: Judgment normal.     BP 124/78 (BP Location: Left Arm, Patient Position: Sitting, Cuff Size: Large)   Pulse 65   Temp 98.3 F (36.8 C) (Oral)   Resp 18   Ht 5\' 7"  (1.702 m)   LMP 02/14/2015 (Approximate)   SpO2 98%   BMI 35.87 kg/m  Wt Readings from Last 3 Encounters:  06/25/22 224 lb 8 oz (101.8 kg)  06/16/22 229 lb (103.9 kg)  04/09/22 225 lb (102.1 kg)       Assessment & Plan:  Skin tag -     Surgical pathology  B12 deficiency -     Cyanocobalamin    I, Jo Held, DO, personally preformed the services described in this documentation.  All medical record entries made by the scribe were at my direction and in my presence.  I have reviewed the chart and discharge instructions (if applicable) and agree that the record  reflects my personal performance and is accurate and complete. 06/23/2022   I,Jo Compton,acting as a scribe for Jo Held, DO.,have documented all relevant documentation on the behalf of Jo Held, DO,as directed by  Jo Held, DO while in the presence of Jo Held, DO.   Jo Held, DO

## 2022-06-23 NOTE — Patient Instructions (Signed)
Skin Tag, Adult A skin tag (acrochordon) is a soft, extra growth of skin. Most skin tags are skin-colored and rarely bigger than a pencil eraser. They often form in areas where there is frequent rubbing, or friction, on the skin. This may be where there are folds in the skin, such as: The eyelids. The neck. The armpits. The groin. Skin tags are not dangerous, and they do not spread from person to person (are not contagious). You may have one skin tag or many. Skin tags do not need treatment. However, your health care provider may recommend removing a skin tag if it: Gets irritated from clothing or jewelry. Bleeds. Is visible and unsightly. What are the causes? This condition is linked to: Increasing age. Pregnancy. Diabetes. Obesity. What are the signs or symptoms? Skin tags usually do not cause symptoms unless they get irritated by items touching your skin, such as clothing or jewelry. When this happens, you may have pain, itching, or bleeding. How is this diagnosed? This condition is diagnosed with an evaluation from your health care provider. No testing is needed for diagnosis. How is this treated? Treatment for this condition depends on whether you have symptoms. Your health care provider may also remove your skin tag if it is visible or if you do not like the way it looks. A skin tag can be removed by a health care provider with: A simple surgical procedure using scissors. A procedure that involves freezing your skin tag with a gas in liquid form (liquid nitrogen). A procedure that uses heat to destroy your skin tag (electrodessication). Follow these instructions at home: Watch for any changes in your skin tag. A normal skin tag does not require any other special care at home. Take over-the-counter and prescription medicines only as told by your health care provider. Keep all follow-up visits. Contact a health care provider if: You have a skin tag that: Becomes painful. Changes  color. Bleeds. Swells. Summary Skin tags are soft, extra growths of skin found in areas of frequent rubbing or friction. Skin tags usually do not cause symptoms. If symptoms occur, you may have pain, itching, or bleeding. Your health care provider may remove your skin tag if it causes symptoms or if you do not like the way it looks. This information is not intended to replace advice given to you by your health care provider. Make sure you discuss any questions you have with your health care provider. Document Revised: 05/28/2021 Document Reviewed: 05/28/2021 Elsevier Patient Education  [place].

## 2022-06-25 ENCOUNTER — Encounter: Payer: Self-pay | Admitting: Gastroenterology

## 2022-06-25 ENCOUNTER — Ambulatory Visit (INDEPENDENT_AMBULATORY_CARE_PROVIDER_SITE_OTHER): Payer: PRIVATE HEALTH INSURANCE | Admitting: Gastroenterology

## 2022-06-25 VITALS — BP 124/80 | HR 80 | Ht 66.0 in | Wt 224.5 lb

## 2022-06-25 DIAGNOSIS — K602 Anal fissure, unspecified: Secondary | ICD-10-CM | POA: Diagnosis not present

## 2022-06-25 DIAGNOSIS — K625 Hemorrhage of anus and rectum: Secondary | ICD-10-CM

## 2022-06-25 DIAGNOSIS — K6289 Other specified diseases of anus and rectum: Secondary | ICD-10-CM

## 2022-06-25 MED ORDER — DILTIAZEM GEL 2 %: CUTANEOUS | 3 refills | Status: DC

## 2022-06-25 NOTE — Patient Instructions (Addendum)
_______________________________________________________  If your blood pressure at your visit was 140/90 or greater, please contact your primary care physician to follow up on this.  _______________________________________________________  If you are age 55 or older, your body mass index should be between 23-30. Your Body mass index is 36.24 kg/m. If this is out of the aforementioned range listed, please consider follow up with your Primary Care Provider.  If you are age 63 or younger, your body mass index should be between 19-25. Your Body mass index is 36.24 kg/m. If this is out of the aformentioned range listed, please consider follow up with your Primary Care Provider.   ________________________________________________________  The [facility] providers would like to encourage you to use [facility] to communicate with providers for non-urgent requests or questions.  Due to long hold times on the telephone, sending your provider a message by [facility] may be a faster and more efficient way to get a response.  Please allow 48 business hours for a response.  Please remember that this is for non-urgent requests.  _______________________________________________________  We have sent a prescription for Diltiazem 2% gel to [facility] for you. Using your index finger, you should apply a small amount of medication inside the rectum up to your first knuckle/joint three times daily x 6- 10 weeks.  [facility]'s information is below: Address: [address] [place], [place] [address]  [phone]  *Please DO NOT go directly from our office to pick up this medication! Give the pharmacy 1 day to process the prescription as this is compounded and takes time to make.

## 2022-06-25 NOTE — Progress Notes (Addendum)
06/25/2022 KAHLIAH AXELROD [id number] 29-Jul-1965   HISTORY OF PRESENT ILLNESS: This is a 57 year old female who is a patient of Dr. Celesta Aver.  She is here today with complaints of rectal pain and bleeding that she not sure if it is due to hemorrhoids or her fissure.  She saw Vicie Mutters, [place], in April 2023 and was diagnosed with an anal fissure.  She used the diltiazem for a while and it did seem to help.  She has constipation, but she is not currently taking anything for it.  She says that she just tries to eat and drink certain things that she knows will help her go to the bathroom and have a bowel movement.  She called in last week and was prescribed diltiazem gel and given hydrocortisone cream.  She has been using those.  Her rectal bleeding has resolved.  Still having pain.  Only using the diltiazem once a day at this point, however.  Has done sitz baths.  08/15/2019 colonoscopy with Dr. Carlean Purl for rectal bleeding good bowel prep using MiraLAX split dose.  2 diminutive adenomatous polyps transverse colon, two 1 to 2 mm adenomatous polyps, inflamed rectum with proctitis, self-limited by pathology recall colonoscopy 07/2024   Past Medical History:  Diagnosis Date   Anal fissure    Anemia    Anxiety    no meds taken   Arthritis    Depression    Fistula    rectal   Hx of adenomatous colonic polyps 08/22/2019   OAB (overactive bladder)    Rectal bleeding    Past Surgical History:  Procedure Laterality Date   ABDOMINAL HYSTERECTOMY Bilateral 03/20/2015   Procedure: HYSTERECTOMY ABDOMINAL, EXCISION MESENTERIC NODULE, BLADDER FLAP BIOPSY;  Surgeon: Ena Dawley, MD;  Location: [facility];  Service: Gynecology;  Laterality: Bilateral;   APPENDECTOMY     BILATERAL SALPINGECTOMY Bilateral 03/20/2015   Procedure: BILATERAL SALPINGECTOMY;  Surgeon: Ena Dawley, MD;  Location: [facility];  Service: Gynecology;  Laterality: Bilateral;   COLONOSCOPY     CYSTOSCOPY N/A 03/20/2015    Procedure: CYSTOSCOPY;  Surgeon: Ena Dawley, MD;  Location: [facility];  Service: Gynecology;  Laterality: N/A;   LAPAROSCOPY N/A 03/20/2015   Procedure: LAPAROSCOPY DIAGNOSTIC;  Surgeon: Ena Dawley, MD;  Location: [facility];  Service: Gynecology;  Laterality: N/A;    reports that she has never smoked. She has never used smokeless tobacco. She reports current alcohol use of about 1.0 standard drink of alcohol per week. She reports that she does not use drugs. family history includes COPD in her mother; Colon cancer in her maternal uncle; Diabetes in her maternal aunt; Hypertension in her maternal aunt and mother; Liver cancer in her maternal uncle; Lung cancer in her mother. No Known Allergies    Outpatient Encounter Medications as of 06/25/2022  Medication Sig   amoxicillin-clavulanate (AUGMENTIN) 875-125 MG tablet Take 1 tablet by mouth 2 (two) times daily.   chlorhexidine (PERIDEX) 0.12 % solution Use as directed 5 mLs in the mouth or throat 2 (two) times daily.   cyanocobalamin (VITAMIN B12) 1000 MCG/ML injection INJECT 1 ML (1,000 MCG TOTAL) INTO THE MUSCLE ONCE A WEEK. INJECTIONS WEEKLY X4. THEN MONTHLY INJECTIONS. RECHECK B12 LEVELS IN 1 MONTH.   diltiazem 2 % GEL Apply 1 Application topically 2 (two) times daily.   docusate sodium (COLACE) 100 MG capsule Take 1 capsule (100 mg total) by mouth 2 (two) times daily.   hydrochlorothiazide (MICROZIDE) 12.5 MG capsule Take 1 capsule (12.5  mg total) by mouth daily.   hydrocortisone (ANUSOL-HC) 2.5 % rectal cream Place 1 Application rectally daily as needed for hemorrhoids or anal itching.   ibuprofen (ADVIL) 800 MG tablet Take 1 tablet (800 mg total) by mouth every 8 (eight) hours as needed.   linaclotide (LINZESS) 145 MCG CAPS capsule Take 1 capsule (145 mcg total) by mouth daily before breakfast.   loratadine (CLARITIN) 10 MG tablet TAKE 1 TABLET BY MOUTH EVERY DAY   ofloxacin (FLOXIN OTIC) 0.3 % OTIC solution Place 5 drops into the left  ear daily.   omeprazole (PRILOSEC) 20 MG capsule Take 1 capsule (20 mg total) by mouth daily.   rosuvastatin (CRESTOR) 10 MG tablet TAKE 1 TABLET BY MOUTH EVERY DAY   Semaglutide-Weight Management (WEGOVY) 0.25 MG/0.5ML SOAJ Inject 0.25 mg into the skin once a week.   SYRINGE-NEEDLE, DISP, 3 ML (LUER LOCK SAFETY SYRINGES) 25G X 1" 3 ML MISC Use to inject B12 weekly for 4 weeks and then monthly   Vitamin D, Ergocalciferol, (DRISDOL) 1.25 MG (50000 UNIT) CAPS capsule Take 1 capsule (50,000 Units total) by mouth every 7 (seven) days.   No facility-administered encounter medications on file as of 06/25/2022.     REVIEW OF SYSTEMS  : All other systems reviewed and negative except where noted in the History of Present Illness.   PHYSICAL EXAM: BP 124/80 (BP Location: Left Arm, Patient Position: Sitting, Cuff Size: Normal)   Pulse 80   Ht '5\' 6"'$  (1.676 m)   Wt 224 lb 8 oz (101.8 kg)   LMP 02/14/2015 (Approximate)   BMI 36.24 kg/m  General: Well developed female in no acute distress Head: Normocephalic and atraumatic Eyes:  Sclerae anicteric, conjunctiva pink. Ears: Normal auditory acuity Lungs: Clear throughout to auscultation; no W/R/R. Heart: Regular rate and rhythm; no M/R/G. Abdomen: Soft, non-distended.  BS present.  Non-tender. Rectal: Small non-inflamed external hemorrhoids.  Posterior anal fissure noted.  Brown stool on exam glove.  Anoscopy not performed. Musculoskeletal: Symmetrical with no gross deformities  Skin: No lesions on visible extremities Extremities: No edema  Neurological: Alert oriented x 4, grossly non-focal Psychological:  Alert and cooperative. Normal mood and affect  ASSESSMENT AND PLAN: *Rectal bleeding rectal pain: Posterior anal fissure once again noted on exam.  Advised to keep stool soft, avoid straining, etc.  Sitz bath's as needed.  Diltiazem gel 2% with 5% lidocaine prescription sent to her pharmacy.  Currently has some without lidocaine so she was given  some packets of RectiCare to use with that as well for now.  She needs to using this 2-3 times daily for the next 6 to 10 weeks.  She already has a follow-up appointment with Vicie Mutters, PA, in about a month so she can keep that for now.  Not currently taking any regimen for her constipation, but says that things are moving well with dietary intake.   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

## 2022-06-29 ENCOUNTER — Telehealth: Payer: Self-pay | Admitting: Gastroenterology

## 2022-06-29 NOTE — Telephone Encounter (Signed)
Inbound call from patient stating that when she came in to see Janett Billow she gave the wrong pharmacy. Patient stated she wants to change the pharmacy to [facility] in [place]. Please advise.

## 2022-07-01 ENCOUNTER — Ambulatory Visit (INDEPENDENT_AMBULATORY_CARE_PROVIDER_SITE_OTHER): Payer: PRIVATE HEALTH INSURANCE

## 2022-07-01 DIAGNOSIS — E538 Deficiency of other specified B group vitamins: Secondary | ICD-10-CM

## 2022-07-01 MED ORDER — CYANOCOBALAMIN 1000 MCG/ML IJ SOLN
1000.0000 ug | Freq: Once | INTRAMUSCULAR | Status: AC
  Administered 2022-07-01: 1000 ug via INTRAMUSCULAR

## 2022-07-01 NOTE — Progress Notes (Signed)
Pt here for weekly B12 injection per   B12 1034mg given IM, right deltoid and pt tolerated injection well.  Next B12 injection scheduled for next week

## 2022-07-01 NOTE — Telephone Encounter (Signed)
Patient is calling asking again to have her medications sent to the [facility] in [facility]. Please advise

## 2022-07-01 NOTE — Addendum Note (Signed)
Addended by: Silvano Rusk E on: 07/01/2022 10:00 AM   Modules accepted: Level of Service

## 2022-07-03 MED ORDER — AMBULATORY NON FORMULARY MEDICATION: 3 refills | Status: DC

## 2022-07-03 NOTE — Telephone Encounter (Signed)
Script faxed to [facility].

## 2022-07-08 ENCOUNTER — Ambulatory Visit: Payer: PRIVATE HEALTH INSURANCE

## 2022-07-14 NOTE — Progress Notes (Deleted)
07/14/2022 Jo Compton [id number] Dec 08, 1965  Referring provider: Carollee Herter, Alferd Apa, * Primary GI doctor: Dr. Carlean Purl  ASSESSMENT AND PLAN:   There are no diagnoses linked to this encounter.   Patient Care Team: Carollee Herter, Alferd Apa, DO as PCP - General (Family Medicine) Ena Dawley, MD as Consulting Physician (Obstetrics and Gynecology)  HISTORY OF PRESENT ILLNESS: 57 y.o. female with a past medical history of adenomatous polyps recall 07/2024, anal fissure, rectal fistula, anemia, anxiety and others listed below presents for evaluation of rectal fissure.   08/15/2019 colonoscopy with Dr. Carlean Purl for rectal bleeding good bowel prep using MiraLAX split dose.  2 diminutive adenomatous polyps transverse colon, two 1 to 2 mm adenomatous polyps, inflamed rectum with proctitis, self-limited by pathology recall colonoscopy 07/2024 08/22/2021 office visit with myself for rectal pain and bleeding.  Found to have posterior fissure, given medication and Linzess to try. 06/25/2022 office visit with Janett Billow is here for posterior anal fissure, had improved with diltiazem but had returned prescribe d yet again diltiazem gel and lidocaine.  She presents for follow up.   She  reports that she has never smoked. She has never used smokeless tobacco. She reports current alcohol use of about 1.0 standard drink of alcohol per week. She reports that she does not use drugs.  RELEVANT LABS AND IMAGING: CBC    Component Value Date/Time   WBC 5.8 06/16/2022 1448   RBC 5.66 (H) 06/16/2022 1448   HGB 14.4 06/16/2022 1448   HCT 44.0 06/16/2022 1448   PLT 286.0 06/16/2022 1448   MCV 77.7 (L) 06/16/2022 1448   MCH 24.9 (L) 05/18/2019 0904   MCHC 32.7 06/16/2022 1448   RDW 14.4 06/16/2022 1448   LYMPHSABS 2.0 06/16/2022 1448   MONOABS 0.5 06/16/2022 1448   EOSABS 0.1 06/16/2022 1448   BASOSABS 0.0 06/16/2022 1448   Recent Labs    08/08/21 0914 04/09/22 1215 06/16/22 1448  HGB 14.4  13.8 14.4     CMP     Component Value Date/Time   NA 139 04/09/2022 1215   K 3.7 04/09/2022 1215   CL 104 04/09/2022 1215   CO2 30 04/09/2022 1215   GLUCOSE 94 04/09/2022 1215   BUN 10 04/09/2022 1215   CREATININE 0.90 04/09/2022 1215   CREATININE 0.88 11/25/2018 1459   CALCIUM 9.5 04/09/2022 1215   PROT 6.6 04/09/2022 1215   ALBUMIN 4.2 04/09/2022 1215   AST 27 04/09/2022 1215   ALT 50 (H) 04/09/2022 1215   ALKPHOS 62 04/09/2022 1215   BILITOT 0.3 04/09/2022 1215   GFRNONAA >60 05/18/2019 0904   GFRAA >60 05/18/2019 0904      Latest Ref Rng & Units 04/09/2022   12:15 PM 08/08/2021    9:14 AM 10/14/2020    2:42 PM  Hepatic Function  Total Protein 6.0 - 8.3 g/dL 6.6  6.8  6.7   Albumin 3.5 - 5.2 g/dL 4.2  4.3  4.1   AST 0 - 37 U/L 27  22  19    ALT 0 - 35 U/L 50  26  23   Alk Phosphatase 39 - 117 U/L 62  58  63   Total Bilirubin 0.2 - 1.2 mg/dL 0.3  0.5  0.4       Current Medications:    Current Outpatient Medications (Cardiovascular):    diltiazem 2 % GEL*, With lidocaine 5% - apply 3 times daily for 6-10 weeks   hydrochlorothiazide (MICROZIDE) 12.5 MG capsule, Take  1 capsule (12.5 mg total) by mouth daily.   rosuvastatin (CRESTOR) 10 MG tablet, TAKE 1 TABLET BY MOUTH EVERY DAY  Current Outpatient Medications (Respiratory):    loratadine (CLARITIN) 10 MG tablet, TAKE 1 TABLET BY MOUTH EVERY DAY  Current Outpatient Medications (Analgesics):    ibuprofen (ADVIL) 800 MG tablet, Take 1 tablet (800 mg total) by mouth every 8 (eight) hours as needed.  Current Outpatient Medications (Hematological):    cyanocobalamin (VITAMIN B12) 1000 MCG/ML injection, INJECT 1 ML (1,000 MCG TOTAL) INTO THE MUSCLE ONCE A WEEK. INJECTIONS WEEKLY X4. THEN MONTHLY INJECTIONS. RECHECK B12 LEVELS IN 1 MONTH.  Current Outpatient Medications (Other):    AMBULATORY NON FORMULARY MEDICATION, Medication Name: Diltiazem 2% [id number] 5% -Using your index finger, apply a small amount of  medication inside the rectum up to your first knuckle/joint three times daily x 6-8 weeks.   amoxicillin-clavulanate (AUGMENTIN) 875-125 MG tablet, Take 1 tablet by mouth 2 (two) times daily.   chlorhexidine (PERIDEX) 0.12 % solution, Use as directed 5 mLs in the mouth or throat 2 (two) times daily.   diltiazem 2 % GEL*, With lidocaine 5% - apply 3 times daily for 6-10 weeks   docusate sodium (COLACE) 100 MG capsule, Take 1 capsule (100 mg total) by mouth 2 (two) times daily.   hydrocortisone (ANUSOL-HC) 2.5 % rectal cream, Place 1 Application rectally daily as needed for hemorrhoids or anal itching.   linaclotide (LINZESS) 145 MCG CAPS capsule, Take 1 capsule (145 mcg total) by mouth daily before breakfast.   ofloxacin (FLOXIN OTIC) 0.3 % OTIC solution, Place 5 drops into the left ear daily.   omeprazole (PRILOSEC) 20 MG capsule, Take 1 capsule (20 mg total) by mouth daily.   Semaglutide-Weight Management (WEGOVY) 0.25 MG/0.5ML SOAJ, Inject 0.25 mg into the skin once a week.   SYRINGE-NEEDLE, DISP, 3 ML (LUER LOCK SAFETY SYRINGES) 25G X 1" 3 ML MISC, Use to inject B12 weekly for 4 weeks and then monthly   Vitamin D, Ergocalciferol, (DRISDOL) 1.25 MG (50000 UNIT) CAPS capsule, Take 1 capsule (50,000 Units total) by mouth every 7 (seven) days. * These medications belong to multiple therapeutic classes and are listed under each applicable group.  Medical History:  Past Medical History:  Diagnosis Date   Anal fissure    Anemia    Anxiety    no meds taken   Arthritis    Depression    Fistula    rectal   Hx of adenomatous colonic polyps 08/22/2019   OAB (overactive bladder)    Rectal bleeding    Allergies: No Known Allergies   Surgical History:  She  has a past surgical history that includes Appendectomy; Colonoscopy; Abdominal hysterectomy (Bilateral, 03/20/2015); laparoscopy (N/A, 03/20/2015); Bilateral salpingectomy (Bilateral, 03/20/2015); and Cystoscopy (N/A, 03/20/2015). Family  History:  Her family history includes COPD in her mother; Colon cancer in her maternal uncle; Diabetes in her maternal aunt; Hypertension in her maternal aunt and mother; Liver cancer in her maternal uncle; Lung cancer in her mother.  REVIEW OF SYSTEMS  : All other systems reviewed and negative except where noted in the History of Present Illness.  PHYSICAL EXAM: LMP 02/14/2015 (Approximate)  General Appearance: Well nourished, in no apparent distress. Head:   Normocephalic and atraumatic. Eyes:  sclerae anicteric,conjunctive pink  Respiratory: Respiratory effort normal, BS equal bilaterally without rales, rhonchi, wheezing. Cardio: RRR with no MRGs. Peripheral pulses intact.  Abdomen: Soft,  {BlankSingle:19197::"Flat","Obese","Non-distended"} ,active bowel sounds. {actendernessAB:27319} tenderness {anatomy; site abdomen:5010}. {BlankMultiple:19196::"Without  guarding","With guarding","Without rebound","With rebound"}. No masses. Rectal: {acrectalexam:27461} Musculoskeletal: Full ROM, {PSY - GAIT AND STATION:22860} gait. {With/Without:[id number]} edema. Skin:  Dry and intact without significant lesions or rashes Neuro: Alert and  oriented x4;  No focal deficits. Psych:  Cooperative. Normal mood and affect.    Vladimir Crofts, PA-C 3:44 PM

## 2022-07-22 ENCOUNTER — Ambulatory Visit: Payer: Self-pay | Admitting: Physician Assistant

## 2022-08-06 ENCOUNTER — Encounter: Payer: Self-pay | Admitting: Family Medicine

## 2022-08-06 ENCOUNTER — Ambulatory Visit (INDEPENDENT_AMBULATORY_CARE_PROVIDER_SITE_OTHER): Payer: PRIVATE HEALTH INSURANCE | Admitting: Family Medicine

## 2022-08-06 ENCOUNTER — Ambulatory Visit (HOSPITAL_BASED_OUTPATIENT_CLINIC_OR_DEPARTMENT_OTHER)
Admission: RE | Admit: 2022-08-06 | Discharge: 2022-08-06 | Disposition: A | Discharge status: Home / Self Care | Payer: PRIVATE HEALTH INSURANCE | Source: Ambulatory Visit | Attending: Family Medicine | Admitting: Family Medicine

## 2022-08-06 ENCOUNTER — Inpatient Hospital Stay (HOSPITAL_BASED_OUTPATIENT_CLINIC_OR_DEPARTMENT_OTHER): Admission: RE | Admit: 2022-08-06 | Payer: PRIVATE HEALTH INSURANCE | Source: Ambulatory Visit

## 2022-08-06 VITALS — BP 130/92 | HR 67 | Temp 98.5°F | Resp 18 | Ht 66.0 in | Wt 228.8 lb

## 2022-08-06 DIAGNOSIS — R1031 Right lower quadrant pain: Secondary | ICD-10-CM | POA: Diagnosis present

## 2022-08-06 DIAGNOSIS — R1011 Right upper quadrant pain: Secondary | ICD-10-CM | POA: Diagnosis not present

## 2022-08-06 LAB — POC URINALSYSI DIPSTICK (AUTOMATED)
Bilirubin, UA: NEGATIVE
Blood, UA: NEGATIVE
Glucose, UA: NEGATIVE
Ketones, UA: NEGATIVE
Leukocytes, UA: NEGATIVE
Nitrite, UA: NEGATIVE
Protein, UA: NEGATIVE
Spec Grav, UA: 1.02 (ref 1.010–1.025)
Urobilinogen, UA: 0.2 E.U./dL
pH, UA: 6 (ref 5.0–8.0)

## 2022-08-06 NOTE — Progress Notes (Addendum)
Subjective:   By signing my name below, I, [name], attest that this documentation has been prepared under the direction and in the presence of Donato Schultz, DO. 08/06/2022   Patient ID: Jo Compton, female    DOB: [birth year], 57 y.o.   MRN: [id number]  Chief Complaint  Patient presents with   Abdominal Pain    Pt states having lower right abdominal pain and pain shoots across, 2-3 weeks. Sxs gettting worse. No urinary sxs.  Some nausea, occ constipation     Abdominal Pain Associated symptoms include constipation and nausea. Pertinent negatives include no diarrhea, dysuria, fever, frequency, headaches or vomiting.   Patient is in today for a office visit.   She complains of lower right abdominal pain that radiates across her middle abdomen for the past 2-3 weeks. She has nausea and constipation but denies having any fevers, vomiting or diarrhea. She is taking omeprazole to help manage her symptoms. She is keeping up her food intake but finds she is full faster.   Past Medical History:  Diagnosis Date   Anal fissure    Anemia    Anxiety    no meds taken   Arthritis    Depression    Fistula    rectal   Hx of adenomatous colonic polyps 08/22/2019   OAB (overactive bladder)    Rectal bleeding     Past Surgical History:  Procedure Laterality Date   ABDOMINAL HYSTERECTOMY Bilateral 03/20/2015   Procedure: HYSTERECTOMY ABDOMINAL, EXCISION MESENTERIC NODULE, BLADDER FLAP BIOPSY;  Surgeon: Kirkland Hun, MD;  Location: [facility];  Service: Gynecology;  Laterality: Bilateral;   APPENDECTOMY     BILATERAL SALPINGECTOMY Bilateral 03/20/2015   Procedure: BILATERAL SALPINGECTOMY;  Surgeon: Kirkland Hun, MD;  Location: [facility];  Service: Gynecology;  Laterality: Bilateral;   COLONOSCOPY     CYSTOSCOPY N/A 03/20/2015   Procedure: CYSTOSCOPY;  Surgeon: Kirkland Hun, MD;  Location: [facility];  Service: Gynecology;  Laterality: N/A;   LAPAROSCOPY N/A 03/20/2015    Procedure: LAPAROSCOPY DIAGNOSTIC;  Surgeon: Kirkland Hun, MD;  Location: [facility];  Service: Gynecology;  Laterality: N/A;    Family History  Problem Relation Age of Onset   Hypertension Mother    COPD Mother    Lung cancer Mother    Colon cancer Maternal Uncle    Liver cancer Maternal Uncle    Hypertension Maternal Aunt    Diabetes Maternal Aunt    Esophageal cancer Neg Hx    Rectal cancer Neg Hx    Stomach cancer Neg Hx     Social History   Socioeconomic History   Marital status: Married    Spouse name: Not on file   Number of children: 1   Years of education: Not on file   Highest education level: Some college, no degree  Occupational History   Occupation: Tax adviser at [employer]  Tobacco Use   Smoking status: Never   Smokeless tobacco: Never  Vaping Use   Vaping Use: Never used  Substance and Sexual Activity   Alcohol use: Yes    Alcohol/week: 1.0 standard drink of alcohol    Types: 1 Glasses of wine per week   Drug use: No   Sexual activity: Yes    Birth control/protection: None  Other Topics Concern   Not on file  Social History Narrative   ** Merged History Encounter **       Social Determinants of Health   Financial Resource Strain: Not on  file  Food Insecurity: Not on file  Transportation Needs: Not on file  Physical Activity: Not on file  Stress: Not on file  Social Connections: Not on file  Intimate Partner Violence: Not on file    Outpatient Medications Prior to Visit  Medication Sig Dispense Refill   AMBULATORY NON FORMULARY MEDICATION Medication Name: Diltiazem 2% [id number] 5% -Using your index finger, apply a small amount of medication inside the rectum up to your first knuckle/joint three times daily x 6-8 weeks. 45 g 3   chlorhexidine (PERIDEX) 0.12 % solution Use as directed 5 mLs in the mouth or throat 2 (two) times daily.     cyanocobalamin (VITAMIN B12) 1000 MCG/ML injection INJECT 1 ML (1,000 MCG TOTAL) INTO THE  MUSCLE ONCE A WEEK. INJECTIONS WEEKLY X4. THEN MONTHLY INJECTIONS. RECHECK B12 LEVELS IN 1 MONTH. 3 mL 1   diltiazem 2 % GEL With lidocaine 5% - apply 3 times daily for 6-10 weeks 30 g 3   docusate sodium (COLACE) 100 MG capsule Take 1 capsule (100 mg total) by mouth 2 (two) times daily. 120 capsule 2   hydrochlorothiazide (MICROZIDE) 12.5 MG capsule Take 1 capsule (12.5 mg total) by mouth daily. 90 capsule 3   hydrocortisone (ANUSOL-HC) 2.5 % rectal cream Place 1 Application rectally daily as needed for hemorrhoids or anal itching. 30 g 0   ibuprofen (ADVIL) 800 MG tablet Take 1 tablet (800 mg total) by mouth every 8 (eight) hours as needed. 30 tablet 0   linaclotide (LINZESS) 145 MCG CAPS capsule Take 1 capsule (145 mcg total) by mouth daily before breakfast. 90 capsule 3   loratadine (CLARITIN) 10 MG tablet TAKE 1 TABLET BY MOUTH EVERY DAY 90 tablet 3   ofloxacin (FLOXIN OTIC) 0.3 % OTIC solution Place 5 drops into the left ear daily. 5 mL 0   omeprazole (PRILOSEC) 20 MG capsule Take 1 capsule (20 mg total) by mouth daily. 30 capsule 3   rosuvastatin (CRESTOR) 10 MG tablet TAKE 1 TABLET BY MOUTH EVERY DAY 30 tablet 0   SYRINGE-NEEDLE, DISP, 3 ML (LUER LOCK SAFETY SYRINGES) 25G X 1" 3 ML MISC Use to inject B12 weekly for 4 weeks and then monthly 50 each 1   Vitamin D, Ergocalciferol, (DRISDOL) 1.25 MG (50000 UNIT) CAPS capsule Take 1 capsule (50,000 Units total) by mouth every 7 (seven) days. 12 capsule 1   amoxicillin-clavulanate (AUGMENTIN) 875-125 MG tablet Take 1 tablet by mouth 2 (two) times daily. 20 tablet 0   Semaglutide-Weight Management (WEGOVY) 0.25 MG/0.5ML SOAJ Inject 0.25 mg into the skin once a week. 2 mL 0   No facility-administered medications prior to visit.    No Known Allergies  Review of Systems  Constitutional:  Negative for fever and malaise/fatigue.  HENT:  Negative for congestion.   Eyes:  Negative for blurred vision.  Respiratory:  Negative for shortness of  breath.   Cardiovascular:  Negative for chest pain, palpitations and leg swelling.  Gastrointestinal:  Positive for abdominal pain, constipation and nausea. Negative for blood in stool, diarrhea and vomiting.  Genitourinary:  Negative for dysuria and frequency.  Musculoskeletal:  Negative for falls.  Skin:  Negative for rash.  Neurological:  Negative for dizziness, loss of consciousness and headaches.  Endo/Heme/Allergies:  Negative for environmental allergies.  Psychiatric/Behavioral:  Negative for depression. The patient is not nervous/anxious.        Objective:    Physical Exam Vitals and nursing note reviewed.  Constitutional:  General: She is not in acute distress.    Appearance: Normal appearance. She is not ill-appearing.  HENT:     Head: Normocephalic and atraumatic.     Right Ear: External ear normal.     Left Ear: External ear normal.  Eyes:     Extraocular Movements: Extraocular movements intact.     Pupils: Pupils are equal, round, and reactive to light.  Cardiovascular:     Rate and Rhythm: Normal rate and regular rhythm.     Heart sounds: Normal heart sounds. No murmur heard.    No gallop.  Pulmonary:     Effort: Pulmonary effort is normal. No respiratory distress.     Breath sounds: Normal breath sounds. No wheezing or rales.  Abdominal:     Palpations: Abdomen is soft.     Tenderness: There is abdominal tenderness in the right upper quadrant and right lower quadrant. There is no rebound.  Skin:    General: Skin is warm and dry.  Neurological:     Mental Status: She is alert and oriented to person, place, and time.  Psychiatric:        Judgment: Judgment normal.     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  All medical record entries made by the scribe were at my direction and in my presence.  I have reviewed the chart and discharge instructions (if applicable) and agree that the record reflects my personal performance and is accurate and complete. 08/06/2022   I,[name],acting as a scribe for Donato SchultzYvonne R Lowne Chase, DO.,have documented all relevant documentation on the behalf of Donato SchultzYvonne R Lowne Chase, DO,as directed by  Donato SchultzYvonne R Lowne Chase, DO while in the presence of Donato SchultzYvonne R Lowne Chase, DO.   Donato SchultzYvonne R Lowne Chase, DO

## 2022-08-06 NOTE — Assessment & Plan Note (Signed)
Check labs  Check [place]

## 2022-08-06 NOTE — Assessment & Plan Note (Signed)
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

## 2022-08-07 LAB — COMPREHENSIVE METABOLIC PANEL
ALT: 20 U/L (ref 0–35)
AST: 17 U/L (ref 0–37)
Albumin: 4.1 g/dL (ref 3.5–5.2)
Alkaline Phosphatase: 71 U/L (ref 39–117)
BUN: 9 mg/dL (ref 6–23)
CO2: 25 mEq/L (ref 19–32)
Calcium: 9.4 mg/dL (ref 8.4–10.5)
Chloride: 106 mEq/L (ref 96–112)
Creatinine, Ser: 0.91 mg/dL (ref 0.40–1.20)
GFR: 71.39 mL/min (ref >60.00)
Glucose, Bld: 84 mg/dL (ref 70–99)
Potassium: 4.3 mEq/L (ref 3.5–5.1)
Sodium: 139 mEq/L (ref 135–145)
Total Bilirubin: 0.3 mg/dL (ref 0.2–1.2)
Total Protein: 6.7 g/dL (ref 6.0–8.3)

## 2022-08-07 LAB — CBC WITH DIFFERENTIAL/PLATELET
Basophils Absolute: 0 10*3/uL (ref 0.0–0.1)
Basophils Relative: 0.5 % (ref 0.0–3.0)
Eosinophils Absolute: 0.1 10*3/uL (ref 0.0–0.7)
Eosinophils Relative: 1.2 % (ref 0.0–5.0)
HCT: 42.7 % (ref 36.0–46.0)
Hemoglobin: 14.1 g/dL (ref 12.0–15.0)
Lymphocytes Relative: 40.8 % (ref 12.0–46.0)
Lymphs Abs: 2.1 10*3/uL (ref 0.7–4.0)
MCHC: 33 g/dL (ref 30.0–36.0)
MCV: 77.2 fl — ABNORMAL LOW (ref 78.0–100.0)
Monocytes Absolute: 0.4 10*3/uL (ref 0.1–1.0)
Monocytes Relative: 8.5 % (ref 3.0–12.0)
Neutro Abs: 2.6 10*3/uL (ref 1.4–7.7)
Neutrophils Relative %: 49 % (ref 43.0–77.0)
Platelets: 264 10*3/uL (ref 150.0–400.0)
RBC: 5.53 Mil/uL — ABNORMAL HIGH (ref 3.87–5.11)
RDW: 14.4 % (ref 11.5–15.5)
WBC: 5.2 10*3/uL (ref 4.0–10.5)

## 2022-08-07 LAB — POCT URINE PREGNANCY: Preg Test, Ur: NEGATIVE

## 2022-08-10 ENCOUNTER — Emergency Department (HOSPITAL_BASED_OUTPATIENT_CLINIC_OR_DEPARTMENT_OTHER)
Admission: EM | Admit: 2022-08-10 | Discharge: 2022-08-10 | Disposition: A | Discharge status: Home / Self Care | Payer: PRIVATE HEALTH INSURANCE | Attending: Emergency Medicine | Admitting: Emergency Medicine

## 2022-08-10 ENCOUNTER — Other Ambulatory Visit: Payer: Self-pay | Admitting: Family Medicine

## 2022-08-10 ENCOUNTER — Other Ambulatory Visit: Payer: Self-pay

## 2022-08-10 ENCOUNTER — Telehealth: Payer: Self-pay | Admitting: Family Medicine

## 2022-08-10 ENCOUNTER — Emergency Department (HOSPITAL_BASED_OUTPATIENT_CLINIC_OR_DEPARTMENT_OTHER): Payer: PRIVATE HEALTH INSURANCE

## 2022-08-10 ENCOUNTER — Telehealth (HOSPITAL_BASED_OUTPATIENT_CLINIC_OR_DEPARTMENT_OTHER): Payer: Self-pay

## 2022-08-10 ENCOUNTER — Encounter (HOSPITAL_BASED_OUTPATIENT_CLINIC_OR_DEPARTMENT_OTHER): Payer: Self-pay | Admitting: Emergency Medicine

## 2022-08-10 DIAGNOSIS — R1031 Right lower quadrant pain: Secondary | ICD-10-CM

## 2022-08-10 DIAGNOSIS — M5431 Sciatica, right side: Secondary | ICD-10-CM

## 2022-08-10 DIAGNOSIS — Z79899 Other long term (current) drug therapy: Secondary | ICD-10-CM | POA: Insufficient documentation

## 2022-08-10 DIAGNOSIS — M545 Low back pain, unspecified: Secondary | ICD-10-CM | POA: Diagnosis present

## 2022-08-10 DIAGNOSIS — R051 Acute cough: Secondary | ICD-10-CM

## 2022-08-10 DIAGNOSIS — I10 Essential (primary) hypertension: Secondary | ICD-10-CM | POA: Diagnosis not present

## 2022-08-10 LAB — URINALYSIS, ROUTINE W REFLEX MICROSCOPIC
Bilirubin Urine: NEGATIVE
Glucose, UA: NEGATIVE mg/dL
Hgb urine dipstick: NEGATIVE
Ketones, ur: NEGATIVE mg/dL
Leukocytes,Ua: NEGATIVE
Nitrite: NEGATIVE
Protein, ur: NEGATIVE mg/dL
Specific Gravity, Urine: >=1.03 (ref 1.005–1.030)
pH: 6 (ref 5.0–8.0)

## 2022-08-10 LAB — COMPREHENSIVE METABOLIC PANEL
ALT: 25 U/L (ref 0–44)
AST: 24 U/L (ref 15–41)
Albumin: 4.2 g/dL (ref 3.5–5.0)
Alkaline Phosphatase: 64 U/L (ref 38–126)
Anion gap: 9 (ref 5–15)
BUN: 11 mg/dL (ref 6–20)
CO2: 26 mmol/L (ref 22–32)
Calcium: 9.5 mg/dL (ref 8.9–10.3)
Chloride: 102 mmol/L (ref 98–111)
Creatinine, Ser: 0.86 mg/dL (ref 0.44–1.00)
GFR, Estimated: >60 mL/min (ref >60)
Glucose, Bld: 102 mg/dL — ABNORMAL HIGH (ref 70–99)
Potassium: 4.2 mmol/L (ref 3.5–5.1)
Sodium: 137 mmol/L (ref 135–145)
Total Bilirubin: 0.4 mg/dL (ref 0.3–1.2)
Total Protein: 7.8 g/dL (ref 6.5–8.1)

## 2022-08-10 LAB — CBC WITH DIFFERENTIAL/PLATELET
Abs Immature Granulocytes: 0.01 10*3/uL (ref 0.00–0.07)
Basophils Absolute: 0 10*3/uL (ref 0.0–0.1)
Basophils Relative: 0 %
Eosinophils Absolute: 0.1 10*3/uL (ref 0.0–0.5)
Eosinophils Relative: 1 %
HCT: 45.2 % (ref 36.0–46.0)
Hemoglobin: 14.5 g/dL (ref 12.0–15.0)
Immature Granulocytes: 0 %
Lymphocytes Relative: 44 %
Lymphs Abs: 2.4 10*3/uL (ref 0.7–4.0)
MCH: 24.8 pg — ABNORMAL LOW (ref 26.0–34.0)
MCHC: 32.1 g/dL (ref 30.0–36.0)
MCV: 77.3 fL — ABNORMAL LOW (ref 80.0–100.0)
Monocytes Absolute: 0.5 10*3/uL (ref 0.1–1.0)
Monocytes Relative: 8 %
Neutro Abs: 2.6 10*3/uL (ref 1.7–7.7)
Neutrophils Relative %: 47 %
Platelets: 278 10*3/uL (ref 150–400)
RBC: 5.85 MIL/uL — ABNORMAL HIGH (ref 3.87–5.11)
RDW: 14.4 % (ref 11.5–15.5)
WBC: 5.6 10*3/uL (ref 4.0–10.5)
nRBC: 0 % (ref 0.0–0.2)

## 2022-08-10 LAB — D-DIMER, QUANTITATIVE: D-Dimer, Quant: 0.36 ug/mL-FEU (ref 0.00–0.50)

## 2022-08-10 LAB — LIPASE, BLOOD: Lipase: 36 U/L (ref 11–51)

## 2022-08-10 LAB — MAGNESIUM: Magnesium: 2.3 mg/dL (ref 1.7–2.4)

## 2022-08-10 MED ORDER — IOHEXOL 300 MG/ML  SOLN
100.0000 mL | Freq: Once | INTRAMUSCULAR | Status: AC | PRN
  Administered 2022-08-10: 100 mL via INTRAVENOUS

## 2022-08-10 MED ORDER — OXYCODONE HCL 5 MG PO TABS
5.0000 mg | ORAL_TABLET | Freq: Once | ORAL | Status: AC
  Administered 2022-08-10: 5 mg via ORAL
  Filled 2022-08-10: qty 1

## 2022-08-10 MED ORDER — LIDOCAINE 5 % EX PTCH
1.0000 | MEDICATED_PATCH | CUTANEOUS | Status: DC
  Administered 2022-08-10: 1 via TRANSDERMAL
  Filled 2022-08-10: qty 1

## 2022-08-10 MED ORDER — KETOROLAC TROMETHAMINE 15 MG/ML IJ SOLN
15.0000 mg | Freq: Once | INTRAMUSCULAR | Status: AC
  Administered 2022-08-10: 15 mg via INTRAMUSCULAR
  Filled 2022-08-10: qty 1

## 2022-08-10 MED ORDER — PREDNISONE 20 MG PO TABS: 40.0000 mg | ORAL_TABLET | Freq: Every day | ORAL | 0 refills | Status: DC

## 2022-08-10 MED ORDER — PREDNISONE 20 MG PO TABS
40.0000 mg | ORAL_TABLET | Freq: Once | ORAL | Status: AC
  Administered 2022-08-10: 40 mg via ORAL
  Filled 2022-08-10: qty 2

## 2022-08-10 MED ORDER — TIZANIDINE HCL 4 MG PO TABS: 4.0000 mg | ORAL_TABLET | Freq: Four times a day (QID) | ORAL | 0 refills | Status: DC | PRN

## 2022-08-10 NOTE — ED Provider Notes (Signed)
[place] [facility] Provider Note   CSN: [id number] Arrival date & time: 08/10/22  1344     History Chief Complaint  Patient presents with   Leg Pain    right    Jo Compton is a 57 y.o. female with history of sciatic nerve pain follows up with [facility], and hypertension presents emerged from today for evaluation of persistent right lower back pain and left lower back pain that radiates down her right leg.  She reports that she is also been having some right lower quadrant pain off and on for the past 2 to 3 weeks that comes along with his back pain.  She is already followed up with her PCP about the right lower quadrant pain and had normal ultrasound of her belly a few days prior.  She denies any diarrhea, constipation, melena, hematochezia, dysuria, hematuria.  She denies any urinary fecal incontinence, urinary retention, fevers, history of any IV drug use, saddle anesthesia, denies any personal history of cancer.  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  She wears crocs at work.  She reports that she was having worsening pain last night after work and it was going into her back to her right calf.  She reports that she walks a lot also for her job as well.  She denies any chest pain or shortness of breath.  Denies any leg swelling.  No known drug allergies.  Denies any tobacco, EtOH, illicit drug use.   Leg Pain Associated symptoms: back pain   Associated symptoms: no fever        Home Medications Prior to Admission medications   Medication Sig Start Date End Date Taking? Authorizing Provider  predniSONE (DELTASONE) 20 MG tablet Take 2 tablets (40 mg total) by mouth daily. 08/10/22  Yes Achille Rich, PA-C  tiZANidine (ZANAFLEX) 4 MG tablet Take 1 tablet (4 mg total) by mouth every 6 (six) hours as needed for muscle spasms. 08/10/22  Yes Achille Rich, PA-C   AMBULATORY NON FORMULARY MEDICATION Medication Name: Diltiazem 2% NWG:[id number] 5% -Using your index finger, apply a small amount of medication inside the rectum up to your first knuckle/joint three times daily x 6-8 weeks. 07/03/22   Zehr, Princella Pellegrini, PA-C  chlorhexidine (PERIDEX) 0.12 % solution Use as directed 5 mLs in the mouth or throat 2 (two) times daily. 05/09/22   [provider]  cyanocobalamin (VITAMIN B12) 1000 MCG/ML injection INJECT 1 ML (1,000 MCG TOTAL) INTO THE MUSCLE ONCE A WEEK. INJECTIONS WEEKLY X4. THEN MONTHLY INJECTIONS. RECHECK B12 LEVELS IN 1 MONTH. 05/04/22   Zola Button, Grayling Congress, DO  diltiazem 2 % GEL With lidocaine 5% - apply 3 times daily for 6-10 weeks 06/25/22   Zehr, Shanda Bumps D, PA-C  docusate sodium (COLACE) 100 MG capsule Take 1 capsule (100 mg total) by mouth 2 (two) times daily. 03/22/15   Kirkland Hun, MD  hydrochlorothiazide (MICROZIDE) 12.5 MG capsule Take 1 capsule (12.5 mg total) by mouth daily. 04/09/22   Donato Schultz, DO  hydrocortisone (ANUSOL-HC) 2.5 % rectal cream Place 1 Application rectally daily as needed for hemorrhoids or anal itching. 06/15/22   Doree Albee, PA-C  ibuprofen (ADVIL) 800 MG tablet Take 1 tablet (800 mg total) by mouth every 8 (eight) hours as needed. 11/25/18   Donato Schultz, DO  linaclotide [facility]) [phone]  MCG CAPS capsule Take 1 capsule (145 mcg total) by mouth daily before breakfast. 08/22/21 08/17/22  Doree Albee, PA-C  loratadine (CLARITIN) 10 MG tablet TAKE 1 TABLET BY MOUTH EVERY DAY 08/29/21   Zola Button, Grayling Congress, DO  ofloxacin (FLOXIN OTIC) 0.3 % OTIC solution Place 5 drops into the left ear daily. 07/10/20   Donato Schultz, DO  omeprazole (PRILOSEC) 20 MG capsule Take 1 capsule (20 mg total) by mouth daily. 04/09/22   Seabron Spates R, DO  rosuvastatin (CRESTOR) 10 MG tablet TAKE 1 TABLET BY MOUTH EVERY DAY 01/13/21   Lowne Chase, Yvonne R, DO  SYRINGE-NEEDLE, DISP, 3 ML (LUER LOCK  SAFETY SYRINGES) 25G X 1" 3 ML MISC Use to inject B12 weekly for 4 weeks and then monthly 04/15/22   Donato Schultz, DO  Vitamin D, Ergocalciferol, (DRISDOL) 1.25 MG (50000 UNIT) CAPS capsule Take 1 capsule (50,000 Units total) by mouth every 7 (seven) days. 04/15/22   Donato Schultz, DO      Allergies    Patient has no known allergies.    Review of Systems   Review of Systems  Constitutional:  Negative for chills and fever.  Respiratory:  Negative for shortness of breath.   Cardiovascular:  Negative for chest pain and leg swelling.  Gastrointestinal:  Positive for abdominal pain. Negative for abdominal distention, blood in stool, constipation, diarrhea, nausea and vomiting.       Denies any fecal incontinence  Genitourinary:  Negative for difficulty urinating, dysuria, frequency, hematuria, pelvic pain and urgency.       Denies any urinary incontinence or urinary retention.  Musculoskeletal:  Positive for back pain and myalgias.       Denies any saddle anesthesia  Neurological:  Negative for weakness and numbness.    Physical Exam Updated Vital Signs BP (!) 140/95   Pulse 63   Temp 98 F (36.7 C) (Oral)   Resp 18   LMP 02/14/2015 (Approximate)   SpO2 98%  Physical Exam Vitals and nursing note reviewed.  Constitutional:      General: She is not in acute distress.    Appearance: Normal appearance. She is not ill-appearing or toxic-appearing.  HENT:     Head: Normocephalic and atraumatic.     Mouth/Throat:     Mouth: Mucous membranes are moist.  Eyes:     General: No scleral icterus.    Extraocular Movements: Extraocular movements intact.     Pupils: Pupils are equal, round, and reactive to light.  Cardiovascular:     Rate and Rhythm: Normal rate and regular rhythm.     Pulses: Normal pulses.     Comments: Palpable radial, DP, and PT pulses bilaterally and are symmetric. Pulmonary:     Effort: Pulmonary effort is normal. No respiratory distress.     Breath  sounds: Normal breath sounds.  Abdominal:     General: Bowel sounds are normal.     Palpations: Abdomen is soft.     Tenderness: There is abdominal tenderness. There is no right CVA tenderness, left CVA tenderness, guarding or rebound.     Comments: Very mild tenderness to the right lower quadrant.  No guarding or rebound.  Abdomen soft.  Normal active bowel sounds.  No overlying skin changes noted.  Musculoskeletal:        General: Tenderness present. No deformity.     Cervical back: Normal range of motion.     Right lower leg: No edema.  Left lower leg: No edema.     Comments: Tenderness overlying right SI joint.  No midline tenderness to the cervical, thoracic, or lumbar spine.  No step-offs or deformities.  No overlying skin changes noted.  Positive straight leg raise on the right.  I do not appreciate any leg swelling.  Leg temperature and coloration appear and feel symmetric.  Her sensation is reportedly intact and symmetric bilaterally.  She has palpable DP and PT pulses.  Compartments are soft.  She has equal strength in her upper and lower bilateral extremities.  Specifically, she has no calf tenderness palpation.  No tenderness to her right leg upon palpation.  Skin:    General: Skin is warm and dry.  Neurological:     General: No focal deficit present.     Mental Status: She is alert. Mental status is at baseline.     Sensory: No sensory deficit.     Motor: No weakness.     Gait: Gait normal.     ED Results / Procedures / Treatments   Labs (all labs ordered are listed, but only abnormal results are displayed) Labs Reviewed  CBC WITH DIFFERENTIAL/PLATELET - Abnormal; Notable for the following components:      Result Value   RBC 5.85 (*)    MCV 77.3 (*)    MCH 24.8 (*)    All other components within normal limits  COMPREHENSIVE METABOLIC PANEL - Abnormal; Notable for the following components:   Glucose, Bld 102 (*)    All other components within normal limits  LIPASE,  BLOOD  URINALYSIS, ROUTINE W REFLEX MICROSCOPIC  D-DIMER, QUANTITATIVE  MAGNESIUM    EKG None  CT L-SPINE NO CHARGE  Result Date: 08/10/2022 CLINICAL DATA:  RLQ abdominal pain, radiating to her back and down her leg EXAM: CT LUMBAR SPINE WITHOUT CONTRAST TECHNIQUE: Multidetector CT imaging of the lumbar spine was performed without intravenous contrast administration. Multiplanar CT image reconstructions were also generated. RADIATION DOSE REDUCTION: This exam was performed according to the departmental dose-optimization program which includes automated exposure control, adjustment of the mA and/or kV according to patient size and/or use of iterative reconstruction technique. COMPARISON:  None Available. FINDINGS: Segmentation: 5 lumbar type vertebrae. Alignment: Grade 1 anterolisthesis of L4 on L5. Vertebrae: Mild degenerative changes. No acute fracture or focal pathologic process. Paraspinal and other soft tissues: Negative. Disc levels: Maintained. IMPRESSION: No acute displaced fracture or traumatic listhesis of the lumbar spine. Electronically Signed   By: Tish Frederickson M.D.   On: 08/10/2022 19:38   CT ABDOMEN PELVIS W CONTRAST  Result Date: 08/10/2022 CLINICAL DATA:  RLQ abdominal pain radiating to her back and down her leg EXAM: CT ABDOMEN AND PELVIS WITH CONTRAST TECHNIQUE: Multidetector CT imaging of the abdomen and pelvis was performed using the standard protocol following bolus administration of intravenous contrast. RADIATION DOSE REDUCTION: This exam was performed according to the departmental dose-optimization program which includes automated exposure control, adjustment of the mA and/or kV according to patient size and/or use of iterative reconstruction technique. CONTRAST:  OMNIPAQUE IOHEXOL 300 MG/ML  SOLN COMPARISON:  None Available. FINDINGS: Lower chest: No acute abnormality. Hepatobiliary: The hepatic parenchyma is mildly diffusely hypodense compared to the splenic  parenchyma consistent with fatty infiltration. No focal liver abnormality. No gallstones, gallbladder wall thickening, or pericholecystic fluid. No biliary dilatation. Pancreas: No focal lesion. Normal pancreatic contour. No surrounding inflammatory changes. No main pancreatic ductal dilatation. Spleen: Normal in size without focal abnormality. Adrenals/Urinary Tract: No adrenal  nodule bilaterally. Bilateral kidneys enhance symmetrically. No hydronephrosis. No hydroureter. The urinary bladder is unremarkable. On delayed imaging, there is no urothelial wall thickening and there are no filling defects in the opacified portions of the bilateral collecting systems or ureters. Stomach/Bowel: Stomach is within normal limits. No evidence of bowel wall thickening or dilatation. The appendix is not definitely identified with no inflammatory changes in the right lower quadrant to suggest acute appendicitis. Vascular/Lymphatic: Multiple phleboliths within the pelvis. No abdominal aorta or iliac aneurysm. No abdominal, pelvic, or inguinal lymphadenopathy. Reproductive: Status post hysterectomy. No adnexal masses. Other: No intraperitoneal free fluid. No intraperitoneal free gas. No organized fluid collection. Musculoskeletal: No abdominal wall hernia or abnormality. No suspicious lytic or blastic osseous lesions. Densely sclerotic lesion along the left iliac likely a bone island. No acute displaced fracture. Please see separately dictated CT lumbar spine 08/09/2022. IMPRESSION: 1. No acute intra-abdominal or intrapelvic abnormality. 2. Mild hepatic steatosis. Electronically Signed   By: Tish Frederickson M.D.   On: 08/10/2022 19:27   US PELVIS (TRANSABDOMINAL ONLY)  Result Date: 08/09/2022 CLINICAL DATA:  RLQ pain.  History of TAH/BSO. EXAM: TRANSABDOMINAL ULTRASOUND OF PELVIS TECHNIQUE: Transabdominalultrasound examination of the pelvis was performed including evaluation of the uterus, ovaries, adnexal regions, and pelvic  cul-de-sac. COMPARISON:  05/23/2020 FINDINGS: Patient is status post hysterectomy. Ovaries are not seen. No masses or fluid collections. IMPRESSION: Unremarkable examination of the pelvis status post hysterectomy and bilateral oophorectomy. Electronically Signed   By: Layla Maw M.D.   On: 08/09/2022 19:00   US Abdomen Complete  Result Date: 08/06/2022 CLINICAL DATA:  Right lower quadrant pain EXAM: ABDOMEN ULTRASOUND COMPLETE COMPARISON:  None Available. FINDINGS: Gallbladder: No gallstones or wall thickening visualized. No sonographic Murphy sign noted by sonographer. Common bile duct: Diameter: 2.5 mm Liver: Increased echogenicity. No focal lesion. Portal vein is patent on color Doppler imaging with normal direction of blood flow towards the liver. IVC: No abnormality visualized. Pancreas: Visualized portion unremarkable. Spleen: Size and appearance within normal limits. Right Kidney: Length: 11.5 cm. Echogenicity within normal limits. No mass or hydronephrosis visualized. Left Kidney: Length: 9.7 cm. Echogenicity within normal limits. No mass or hydronephrosis visualized. Abdominal aorta: No aneurysm visualized. Other findings: None. IMPRESSION: 1. Increased hepatic parenchymal echogenicity suggestive of steatosis. 2. No cholelithiasis or sonographic evidence for acute cholecystitis. Electronically Signed   By: Annia Belt M.D.   On: 08/06/2022 15:46     Procedures Procedures   Medications Ordered in ED Medications  iohexol (OMNIPAQUE) 300 MG/ML solution 100 mL (100 mLs Intravenous Contrast Given 08/10/22 1848)  ketorolac (TORADOL) 15 MG/ML injection 15 mg (15 mg Intramuscular Given 08/10/22 2145)  oxyCODONE (Oxy IR/ROXICODONE) immediate release tablet 5 mg (5 mg Oral Given 08/10/22 2145)  predniSONE (DELTASONE) tablet 40 mg (40 mg Oral Given 08/10/22 2145)    ED Course/ Medical Decision Making/ A&P                         Medical Decision Making Amount and/or Complexity of Data  Reviewed Labs: ordered. Radiology: ordered.  Risk Prescription drug management.   57 y.o. female presents to the ER for evaluation of back pain radiating down right leg and right lower abdominal pain. Differential diagnosis includes but is not limited to Fracture (acute/chronic), muscle strain, cauda equina, spinal stenosis, DDD, ligamentous injury, disk herniation, metastatic cancer, vertebral osteomyelitis, kidney stone, pyelonephritis, AAA, pancreatitis, bowel obstruction, meningitis, mesenteric ischemia, appendicitis, diverticulitis, DKA, gastroenteritis, nephrolithiasis, pancreatitis, constipation,  UTI, bowel obstruction, biliary disease, IBD, PUD, hepatitis. Vital signs show mildly elevated BP, otherwise unremarkable. Physical exam as noted above.   Will order labs in addition to CT imaging given the patient's right lower quadrant pain although she has had an appendectomy.  I independently reviewed and interpreted the patient's labs. Urinalysis unremarkable.  CBC without leukocytosis or anemia.  D-dimer within normal limits.  Lipase within normal limits.  CMP shows mildly elevated glucose at 102 otherwise no electrolyte or LFT abnormality.  Magnesium within normal limits as well.  CT AP shows  1. No acute intra-abdominal or intrapelvic abnormality. 2. Mild hepatic steatosis.  CT lumbar shows Segmentation: 5 lumbar type vertebrae. Alignment: Grade 1 anterolisthesis of L4 on L5. Vertebrae: Mild degenerative changes. No acute fracture or focal pathologic process. Paraspinal and other soft tissues: Negative. Disc levels: Maintained.  There is no acute findings found on CT or lumbar.  I doubt any cauda equina as patient is not any red flag symptoms.  After consideration of the diagnostic results and the patients response to treatment, I feel that the patient can be discharged home with follow up.This is atraumatic and she does not have any focal weakness.  I doubt any osteomyelitis, patient does  not have any risk factors.  She also does not have a leukocytosis, tachycardia, or fever.  CT imaging does not show any signs of bowel obstruction.  I doubt any meningitis his pains been going on for weeks.  No signs of aortic dilation, doubt any AAA.  Pain is not consistent with any mesenteric ischemia.  Ultrasound and CT imaging are negative for any biliary disease.  This is likely referred pain from her known sciatica.  She was told that she needed surgery but did not want surgery by [facility].  I advised her that she should follow-up with [facility] in the meantime.  Will prescribe her a steroid burst as well as some muscle relaxers.  Will write her off for a few days of work.  I encouraged her to invest in a better pair of shoes that would further help support her back. .   We discussed the results of the labs/imaging. The plan is supportive care with prednisone and muscle laxer's, rest, follow-up PCP for possible PT recommendations, follow-up with [facility]. We discussed strict return precautions and red flag symptoms. The patient verbalized their understanding and agrees to the plan. The patient is stable and being discharged home in good condition.  Portions of this report may have been transcribed using voice recognition software. Every effort was made to ensure accuracy; however, inadvertent computerized transcription errors may be present.    Final Clinical Impression(s) / ED Diagnoses Final diagnoses:  Sciatica of right side  RLQ abdominal pain    Rx / DC Orders ED Discharge Orders          Ordered    predniSONE (DELTASONE) 20 MG tablet  Daily        08/10/22 2313    tiZANidine (ZANAFLEX) 4 MG tablet  Every 6 hours PRN        08/10/22 2313              Achille Rich, PA-C 08/14/22 2300    Ernie Avena, MD 08/17/22 1724

## 2022-08-10 NOTE — Discharge Instructions (Addendum)
You were seen in the ER today for evaluation of your back pain, abdominal pain, and right leg pain.  Your imaging does not show any acute changes.  Your lab work is overall unremarkable.  I have given you some anti-inflammatories as well as lidocaine patch and a dose of narcotic pain medication.  I did send you home with some prednisone for you to take daily for the next 4 days.  I would take your second dose around mid afternoon evening tomorrow the prednisone.  I recommend 1000 mg of Tylenol every 6 hours as needed for pain.  Do not take any ibuprofen while on the prednisone did seem to irritate your stomach.  You could also give lidocaine patches over-the-counter.  I recommend following up with the neurosurgeon you saw for your back pain already as well as your PCP for possible physical therapy referral.  If you have any trouble controlling your bowel or bladder, trouble urinating, fevers, numbness in your vagina or bottom area, weakness in your legs, or any color changes, please return to the nearest emergency department for evaluation. If you have any concerns, new or worsening symptoms, please return to the nearest emergency department for reevaluation.  Contact a doctor if: Your pain is not controlled by medicine. Your pain does not get better. Your pain gets worse. Your pain lasts longer than 4 weeks. You lose weight without trying. Get help right away if: You cannot control when you pee (urinate) or poop (have a bowel movement). You have weakness in any of these areas and it gets worse: Lower back. The area between your hip bones. Butt. Legs. You have redness or swelling of your back. You have a burning feeling when you pee.

## 2022-08-10 NOTE — ED Triage Notes (Signed)
Right lower posterior leg pain radiating to upper leg and lower back . Denies shortness of breath , has Hx leg pain bilaterally and Hx sciatica .

## 2022-08-10 NOTE — ED Notes (Signed)
PB, Graham crackers and water given to pt, ok'd by EDP.

## 2022-08-10 NOTE — Telephone Encounter (Signed)
Error

## 2022-08-10 NOTE — ED Notes (Signed)
Attempted IV x 2, veins blew both times, 2nd RN to attempt

## 2022-08-10 NOTE — Telephone Encounter (Signed)
Pt called to find out next steps from her labs/imaging. Results are in and she saw them on mychart but isn't sure what they all mean and what she needs to do. Please follow up with provider.

## 2022-08-10 NOTE — Telephone Encounter (Signed)
Pt called back- she has seen where Dr. Laury Axon commented on her blood work that it was fine- but she noted that her RBCs are elevated and she is still in pain. She is wanting to know what else to do.

## 2022-11-23 ENCOUNTER — Other Ambulatory Visit (HOSPITAL_BASED_OUTPATIENT_CLINIC_OR_DEPARTMENT_OTHER): Payer: Self-pay | Admitting: Family Medicine

## 2022-11-23 DIAGNOSIS — Z1231 Encounter for screening mammogram for malignant neoplasm of breast: Secondary | ICD-10-CM

## 2022-11-26 ENCOUNTER — Ambulatory Visit (INDEPENDENT_AMBULATORY_CARE_PROVIDER_SITE_OTHER): Payer: PRIVATE HEALTH INSURANCE | Admitting: Family Medicine

## 2022-11-26 ENCOUNTER — Encounter: Payer: Self-pay | Admitting: Family Medicine

## 2022-11-26 VITALS — BP 120/88 | HR 68 | Temp 98.4°F | Resp 18 | Ht 66.0 in | Wt 227.0 lb

## 2022-11-26 DIAGNOSIS — E785 Hyperlipidemia, unspecified: Secondary | ICD-10-CM

## 2022-11-26 DIAGNOSIS — J029 Acute pharyngitis, unspecified: Secondary | ICD-10-CM

## 2022-11-26 DIAGNOSIS — R0981 Nasal congestion: Secondary | ICD-10-CM

## 2022-11-26 DIAGNOSIS — N644 Mastodynia: Secondary | ICD-10-CM

## 2022-11-26 DIAGNOSIS — R5383 Other fatigue: Secondary | ICD-10-CM

## 2022-11-26 DIAGNOSIS — J0140 Acute pansinusitis, unspecified: Secondary | ICD-10-CM

## 2022-11-26 DIAGNOSIS — J302 Other seasonal allergic rhinitis: Secondary | ICD-10-CM

## 2022-11-26 DIAGNOSIS — E538 Deficiency of other specified B group vitamins: Secondary | ICD-10-CM | POA: Diagnosis not present

## 2022-11-26 DIAGNOSIS — R9230 Dense breasts, unspecified: Secondary | ICD-10-CM

## 2022-11-26 DIAGNOSIS — K219 Gastro-esophageal reflux disease without esophagitis: Secondary | ICD-10-CM

## 2022-11-26 DIAGNOSIS — H9202 Otalgia, left ear: Secondary | ICD-10-CM

## 2022-11-26 LAB — POC COVID19 BINAXNOW: SARS Coronavirus 2 Ag: NEGATIVE

## 2022-11-26 LAB — POCT RAPID STREP A (OFFICE): Rapid Strep A Screen: NEGATIVE

## 2022-11-26 MED ORDER — AMOXICILLIN-POT CLAVULANATE 875-125 MG PO TABS
1.0000 | ORAL_TABLET | Freq: Two times a day (BID) | ORAL | 0 refills | Status: DC
Start: 2022-11-26 — End: 2022-12-17

## 2022-11-26 MED ORDER — FLUTICASONE PROPIONATE 50 MCG/ACT NA SUSP
2.0000 | Freq: Every day | NASAL | 6 refills | Status: DC
Start: 2022-11-26 — End: 2023-04-15

## 2022-11-26 MED ORDER — LORATADINE 10 MG PO TABS
10.0000 mg | ORAL_TABLET | Freq: Every day | ORAL | 3 refills | Status: AC
Start: 2022-11-26 — End: ?

## 2022-11-26 MED ORDER — PANTOPRAZOLE SODIUM 20 MG PO TBEC: 20.0000 mg | DELAYED_RELEASE_TABLET | Freq: Every day | ORAL | 3 refills | Status: DC

## 2022-11-26 NOTE — Progress Notes (Signed)
New Patient Office Visit  Subjective    Patient ID: Jo Compton, female    DOB: November 13, 1965  Age: 57 y.o. MRN: [id number]  CC:  Chief Complaint  Patient presents with   Breast Pain    Right breast pain, x2 weeks, no discharge from nipple, no redness or lumps.    Sinus Problem    X1- 2 weeks, no OTC meds, no COVID test, no fevers, no cough, sore throat, dizziness and headache    Atrial Fibrillation    HP Jo Compton presents  Discussed the use of AI scribe software for clinical note transcription with the patient, who gave verbal consent to proceed.  History of Present Illness   The patient presents with right breast pain, which she describes as deep and more noticeable when she removes her bra. She denies noticing any lumps or changes in the breast, and there is no associated discharge or redness. She reports a similar episode of pain in the past, which resolved after a diagnostic mammogram showed only fatty tissue. The patient has scheduled a routine mammogram but is concerned about the recurrence of pain.  In addition, she reports symptoms suggestive of a sinus issue, including pain at the top of her head, a sore throat, dizziness, and nausea. She also mentions a feeling of pressure in her head. These symptoms have been ongoing for approximately one to two weeks. The patient has been using loratadine and Flonase intermittently for these symptoms.  The patient also mentions fatigue, which she attributes to not receiving her regular B12 shots. She denies taking over-the-counter B12 supplements.       Outpatient Encounter Medications as of 11/26/2022  Medication Sig   AMBULATORY NON FORMULARY MEDICATION Medication Name: Diltiazem 2% [id number] 5% -Using your index finger, apply a small amount of medication inside the rectum up to your first knuckle/joint three times daily x 6-8 weeks.   amoxicillin-clavulanate (AUGMENTIN) 875-125 MG tablet Take 1 tablet by mouth 2 (two) times  daily.   chlorhexidine (PERIDEX) 0.12 % solution Use as directed 5 mLs in the mouth or throat 2 (two) times daily.   cyanocobalamin (VITAMIN B12) 1000 MCG/ML injection INJECT 1 ML (1,000 MCG TOTAL) INTO THE MUSCLE ONCE A WEEK. INJECTIONS WEEKLY X4. THEN MONTHLY INJECTIONS. RECHECK B12 LEVELS IN 1 MONTH.   diltiazem 2 % GEL With lidocaine 5% - apply 3 times daily for 6-10 weeks   docusate sodium (COLACE) 100 MG capsule Take 1 capsule (100 mg total) by mouth 2 (two) times daily.   fluticasone (FLONASE) 50 MCG/ACT nasal spray Place 2 sprays into both nostrils daily.   hydrochlorothiazide (MICROZIDE) 12.5 MG capsule Take 1 capsule (12.5 mg total) by mouth daily.   hydrocortisone (ANUSOL-HC) 2.5 % rectal cream Place 1 Application rectally daily as needed for hemorrhoids or anal itching.   ibuprofen (ADVIL) 800 MG tablet Take 1 tablet (800 mg total) by mouth every 8 (eight) hours as needed.   ofloxacin (FLOXIN OTIC) 0.3 % OTIC solution Place 5 drops into the left ear daily.   omeprazole (PRILOSEC) 20 MG capsule Take 1 capsule (20 mg total) by mouth daily.   pantoprazole (PROTONIX) 20 MG tablet Take 1 tablet (20 mg total) by mouth daily.   predniSONE (DELTASONE) 20 MG tablet Take 2 tablets (40 mg total) by mouth daily.   rosuvastatin (CRESTOR) 10 MG tablet TAKE 1 TABLET BY MOUTH EVERY DAY   SYRINGE-NEEDLE, DISP, 3 ML (LUER LOCK SAFETY SYRINGES) 25G X 1" 3 ML  MISC Use to inject B12 weekly for 4 weeks and then monthly   tiZANidine (ZANAFLEX) 4 MG tablet Take 1 tablet (4 mg total) by mouth every 6 (six) hours as needed for muscle spasms.   Vitamin D, Ergocalciferol, (DRISDOL) 1.25 MG (50000 UNIT) CAPS capsule Take 1 capsule (50,000 Units total) by mouth every 7 (seven) days.   [DISCONTINUED] loratadine (CLARITIN) 10 MG tablet TAKE 1 TABLET BY MOUTH EVERY DAY   linaclotide (LINZESS) 145 MCG CAPS capsule Take 1 capsule (145 mcg total) by mouth daily before breakfast.   loratadine (CLARITIN) 10 MG tablet Take  1 tablet (10 mg total) by mouth daily.   No facility-administered encounter medications on file as of 11/26/2022.    Past Medical History:  Diagnosis Date   Anal fissure    Anemia    Anxiety    no meds taken   Arthritis    Depression    Fistula    rectal   Hx of adenomatous colonic polyps 08/22/2019   OAB (overactive bladder)    Rectal bleeding     Past Surgical History:  Procedure Laterality Date   ABDOMINAL HYSTERECTOMY Bilateral 03/20/2015   Procedure: HYSTERECTOMY ABDOMINAL, EXCISION MESENTERIC NODULE, BLADDER FLAP BIOPSY;  Surgeon: Kirkland Hun, MD;  Location: [facility];  Service: Gynecology;  Laterality: Bilateral;   APPENDECTOMY     BILATERAL SALPINGECTOMY Bilateral 03/20/2015   Procedure: BILATERAL SALPINGECTOMY;  Surgeon: Kirkland Hun, MD;  Location: [facility];  Service: Gynecology;  Laterality: Bilateral;   COLONOSCOPY     CYSTOSCOPY N/A 03/20/2015   Procedure: CYSTOSCOPY;  Surgeon: Kirkland Hun, MD;  Location: [facility];  Service: Gynecology;  Laterality: N/A;   LAPAROSCOPY N/A 03/20/2015   Procedure: LAPAROSCOPY DIAGNOSTIC;  Surgeon: Kirkland Hun, MD;  Location: [facility];  Service: Gynecology;  Laterality: N/A;    Family History  Problem Relation Age of Onset   Hypertension Mother    COPD Mother    Lung cancer Mother    Colon cancer Maternal Uncle    Liver cancer Maternal Uncle    Hypertension Maternal Aunt    Diabetes Maternal Aunt    Esophageal cancer Neg Hx    Rectal cancer Neg Hx    Stomach cancer Neg Hx     Social History   Socioeconomic History   Marital status: Married    Spouse name: Not on file   Number of children: 1   Years of education: Not on file   Highest education level: Some college, no degree  Occupational History   Occupation: Tax adviser at [employer]  Tobacco Use   Smoking status: Never   Smokeless tobacco: Never  Vaping Use   Vaping status: Never Used  Substance and Sexual Activity   Alcohol use: Yes     Alcohol/week: 1.0 standard drink of alcohol    Types: 1 Glasses of wine per week   Drug use: No   Sexual activity: Yes    Birth control/protection: None  Other Topics Concern   Not on file  Social History Narrative   ** Merged History Encounter **       Social Determinants of Health   Financial Resource Strain: Not on file  Food Insecurity: Not on file  Transportation Needs: Not on file  Physical Activity: Not on file  Stress: Not on file  Social Connections: Unknown (09/09/2021)   Received from [facility]   Social Network    Social Network: Not on file  Intimate Partner Violence: Unknown (07/31/2021)  Received from [facility]   HITS    Physically Hurt: Not on file    Insult or Talk Down To: Not on file    Threaten Physical Harm: Not on file    Scream or Curse: Not on file    Review of Systems  Constitutional:  Negative for fever and malaise/fatigue.  HENT:  Positive for congestion, sinus pain and sore throat.   Eyes:  Negative for blurred vision.  Respiratory:  Negative for cough and shortness of breath.   Cardiovascular:  Negative for chest pain, palpitations and leg swelling.  Gastrointestinal:  Positive for nausea. Negative for vomiting.  Musculoskeletal:  Positive for myalgias. Negative for back pain.  Skin:  Negative for rash.  Neurological:  Negative for loss of consciousness and headaches.        Objective    BP 120/88 (BP Location: Left Arm, Patient Position: Sitting, Cuff Size: Large)   Pulse 68   Temp 98.4 F (36.9 C) (Oral)   Resp 18   Ht 5\' 6"  (1.676 m)   Wt 227 lb (103 kg)   LMP 02/14/2015 (Approximate)   SpO2 99%   BMI 36.64 kg/m   Physical Exam Vitals and nursing note reviewed.  Constitutional:      General: She is not in acute distress.    Appearance: Normal appearance.  HENT:     Head: Normocephalic and atraumatic.     Right Ear: Tympanic membrane, ear canal and external ear normal. There is no impacted cerumen.     Left Ear:  Tympanic membrane, ear canal and external ear normal. There is no impacted cerumen.     Nose: No congestion or rhinorrhea.     Right Sinus: Maxillary sinus tenderness and frontal sinus tenderness present.     Left Sinus: No maxillary sinus tenderness or frontal sinus tenderness.     Mouth/Throat:     Mouth: Mucous membranes are moist.     Pharynx: Oropharynx is clear. Posterior oropharyngeal erythema and postnasal drip present. No oropharyngeal exudate.  Eyes:     General: No scleral icterus.       Right eye: No discharge.        Left eye: No discharge.     Conjunctiva/sclera: Conjunctivae normal.  Cardiovascular:     Rate and Rhythm: Normal rate and regular rhythm.     Heart sounds: Normal heart sounds.  Pulmonary:     Effort: Pulmonary effort is normal. No respiratory distress.     Breath sounds: Normal breath sounds.  Musculoskeletal:     Cervical back: Normal range of motion.  Lymphadenopathy:     Cervical: No cervical adenopathy.  Skin:    General: Skin is warm and dry.  Neurological:     Mental Status: She is alert and oriented to person, place, and time.  Psychiatric:        Mood and Affect: Mood normal.        Behavior: Behavior normal.        Thought Content: Thought content normal.        Judgment: Judgment normal.         Assessment & Plan:   Problem List Items Addressed This Visit       Unprioritized   Gastroesophageal reflux disease   Relevant Medications   pantoprazole (PROTONIX) 20 MG tablet   Seasonal allergies   Relevant Medications   loratadine (CLARITIN) 10 MG tablet   Acute otalgia, left   Relevant Medications   loratadine (CLARITIN) 10  MG tablet   Hyperlipidemia   Relevant Orders   CBC with Differential/Platelet   Lipid panel   Comprehensive metabolic panel   Z61 deficiency   Relevant Orders   Vitamin B12   Acute non-recurrent pansinusitis - Primary   Relevant Medications   amoxicillin-clavulanate (AUGMENTIN) 875-125 MG tablet    loratadine (CLARITIN) 10 MG tablet   fluticasone (FLONASE) 50 MCG/ACT nasal spray   Other Visit Diagnoses     Congestion of nasal sinus       Relevant Medications   amoxicillin-clavulanate (AUGMENTIN) 875-125 MG tablet   Other Relevant Orders   POC COVID-19   Pharyngitis, unspecified etiology       Relevant Orders   POCT rapid strep A   Dense breast tissue       Relevant Orders   MR BREAST BILATERAL W CONTRAST   Breast pain       Relevant Orders   MR BREAST BILATERAL W CONTRAST   Other fatigue       Relevant Orders   CBC with Differential/Platelet   VITAMIN D 25 Hydroxy (Vit-D Deficiency, Fractures)   TSH     Assessment and Plan    Breast Pain Right-sided breast pain, intermittent in nature, no palpable lump or discharge. Previous diagnostic mammogram showed dense breasts with fatty tissue. -Order MRI of breasts to better evaluate the dense tissue. -Continue with scheduled mammogram on 8th.  Sinusitis Complaints of dizziness, nausea, and pressure in the head for the past 1-2 weeks. Physical examination revealed swollen glands. -Prescribe Augmentin. -Advise to restart Loratadine and Flonase.  Skin Tag Patient requested removal of skin tag. -Schedule appointment for skin tag removal.  Vitamin B12 Deficiency Patient reports fatigue, has not had B12 shot in a while. -Check B12 levels. -Advise patient to continue over-the-counter B12 supplementation.  Vitamin D Deficiency Patient inquired about previous Vitamin D prescription. -Explain that Vitamin D was prescribed due to low levels.  GERD Patient reports feeling nauseated, previously prescribed Omeprazole. -Prescribe Pantoprazole 20mg .  Follow-up Await insurance approval for breast MRI. Check B12 levels. Schedule appointment for skin tag removal.        No follow-ups on file.   Jo Schultz, DO

## 2022-11-27 ENCOUNTER — Other Ambulatory Visit: Payer: Self-pay | Admitting: Family Medicine

## 2022-11-27 DIAGNOSIS — R9230 Dense breasts, unspecified: Secondary | ICD-10-CM

## 2022-11-27 DIAGNOSIS — N644 Mastodynia: Secondary | ICD-10-CM

## 2022-12-02 ENCOUNTER — Other Ambulatory Visit: Payer: Self-pay

## 2022-12-02 ENCOUNTER — Other Ambulatory Visit: Payer: Self-pay | Admitting: Family Medicine

## 2022-12-02 DIAGNOSIS — E538 Deficiency of other specified B group vitamins: Secondary | ICD-10-CM

## 2022-12-02 DIAGNOSIS — E785 Hyperlipidemia, unspecified: Secondary | ICD-10-CM

## 2022-12-02 DIAGNOSIS — E559 Vitamin D deficiency, unspecified: Secondary | ICD-10-CM

## 2022-12-02 MED ORDER — VITAMIN D (ERGOCALCIFEROL) 1.25 MG (50000 UNIT) PO CAPS: 50000.0000 [IU] | ORAL_CAPSULE | ORAL | 1 refills | Status: DC

## 2022-12-02 MED ORDER — ROSUVASTATIN CALCIUM 20 MG PO TABS: 20.0000 mg | ORAL_TABLET | Freq: Every day | ORAL | 2 refills | Status: DC

## 2022-12-03 ENCOUNTER — Encounter (HOSPITAL_BASED_OUTPATIENT_CLINIC_OR_DEPARTMENT_OTHER): Payer: Self-pay

## 2022-12-03 ENCOUNTER — Ambulatory Visit (HOSPITAL_BASED_OUTPATIENT_CLINIC_OR_DEPARTMENT_OTHER)
Admission: RE | Admit: 2022-12-03 | Discharge: 2022-12-03 | Disposition: A | Discharge status: Home / Self Care | Payer: PRIVATE HEALTH INSURANCE | Source: Ambulatory Visit | Attending: Family Medicine | Admitting: Family Medicine

## 2022-12-03 DIAGNOSIS — Z1231 Encounter for screening mammogram for malignant neoplasm of breast: Secondary | ICD-10-CM | POA: Diagnosis present

## 2022-12-08 ENCOUNTER — Ambulatory Visit (HOSPITAL_COMMUNITY): Payer: PRIVATE HEALTH INSURANCE

## 2022-12-11 ENCOUNTER — Ambulatory Visit (HOSPITAL_COMMUNITY): Admission: RE | Admit: 2022-12-11 | Payer: PRIVATE HEALTH INSURANCE | Source: Ambulatory Visit

## 2022-12-11 DIAGNOSIS — R9230 Dense breasts, unspecified: Secondary | ICD-10-CM | POA: Diagnosis not present

## 2022-12-11 DIAGNOSIS — N644 Mastodynia: Secondary | ICD-10-CM | POA: Diagnosis present

## 2022-12-11 MED ORDER — GADOBUTROL 1 MMOL/ML IV SOLN
10.0000 mL | Freq: Once | INTRAVENOUS | Status: AC | PRN
  Administered 2022-12-11: 10 mL via INTRAVENOUS

## 2022-12-17 ENCOUNTER — Encounter: Payer: Self-pay | Admitting: Family Medicine

## 2022-12-17 ENCOUNTER — Ambulatory Visit (INDEPENDENT_AMBULATORY_CARE_PROVIDER_SITE_OTHER): Payer: PRIVATE HEALTH INSURANCE | Admitting: Family Medicine

## 2022-12-17 ENCOUNTER — Other Ambulatory Visit: Payer: Self-pay | Admitting: Family Medicine

## 2022-12-17 DIAGNOSIS — L918 Other hypertrophic disorders of the skin: Secondary | ICD-10-CM | POA: Diagnosis not present

## 2022-12-17 DIAGNOSIS — J0140 Acute pansinusitis, unspecified: Secondary | ICD-10-CM

## 2022-12-17 MED ORDER — ZEPBOUND 2.5 MG/0.5ML ~~LOC~~ SOAJ: 2.5000 mg | SUBCUTANEOUS | 0 refills | Status: DC

## 2022-12-17 MED ORDER — AZITHROMYCIN 250 MG PO TABS: ORAL_TABLET | ORAL | 0 refills | Status: DC

## 2022-12-17 NOTE — Patient Instructions (Signed)
Skin Tag, Adult A skin tag (acrochordon) is a soft, extra growth of skin. Most skin tags are skin-colored and rarely bigger than a pencil eraser. They often form in areas where there is frequent rubbing, or friction, on the skin. This may be where there are folds in the skin, such as: The eyelids. The neck. The armpits. The groin. Skin tags are not dangerous, and they do not spread from person to person (are not contagious). You may have one skin tag or many. Skin tags do not need treatment. However, your health care provider may recommend removing a skin tag if it: Gets irritated from clothing or jewelry. Bleeds. Is visible and unsightly. What are the causes? This condition is linked to: Increasing age. Pregnancy. Diabetes. Obesity. What are the signs or symptoms? Skin tags usually do not cause symptoms unless they get irritated by items touching your skin, such as clothing or jewelry. When this happens, you may have pain, itching, or bleeding. How is this diagnosed? This condition is diagnosed with an evaluation from your health care provider. No testing is needed for diagnosis. How is this treated? Treatment for this condition depends on whether you have symptoms. Your health care provider may also remove your skin tag if it is visible or if you do not like the way it looks. A skin tag can be removed by a health care provider with: A simple surgical procedure using scissors. A procedure that involves freezing your skin tag with a gas in liquid form (liquid nitrogen). A procedure that uses heat to destroy your skin tag (electrodessication). Follow these instructions at home: Watch for any changes in your skin tag. A normal skin tag does not require any other special care at home. Take over-the-counter and prescription medicines only as told by your health care provider. Keep all follow-up visits. Contact a health care provider if: You have a skin tag that: Becomes painful. Changes  color. Bleeds. Swells. Summary Skin tags are soft, extra growths of skin found in areas of frequent rubbing or friction. Skin tags usually do not cause symptoms. If symptoms occur, you may have pain, itching, or bleeding. Your health care provider may remove your skin tag if it causes symptoms or if you do not like the way it looks. This information is not intended to replace advice given to you by your health care provider. Make sure you discuss any questions you have with your health care provider. Document Revised: 05/28/2021 Document Reviewed: 05/28/2021 Elsevier Patient Education  2024 [employer].

## 2022-12-17 NOTE — Progress Notes (Signed)
Skin Tag Removal Procedure Note  Pre-operative Diagnosis: Classic skin tags (acrochordon)  Post-operative Diagnosis: Classic skin tags (acrochordon)  Locations:lateral  neck   Indications: irritation   Anesthesia: Lidocaine 1% without epinephrine without added sodium bicarbonate  Procedure Details  The risks (including bleeding and infection) and benefits of the procedure and Verbal informed consent obtained. Using sterile iris scissors, multiple skin tags were snipped off at their bases after cleansing with Betadine.  Bleeding was controlled by pressure.   Findings: Pathognomonic benign lesions  not sent for pathological exam.  Condition: Stable  Complications: none.  Plan: 1. Instructed to keep the wounds dry and covered for 24-48h and clean thereafter. 2. Warning signs of infection were reviewed.   3. Recommended that the patient use OTC acetaminophen as needed for pain.  4. Return as needed.

## 2022-12-18 ENCOUNTER — Other Ambulatory Visit (HOSPITAL_COMMUNITY): Payer: Self-pay

## 2022-12-22 ENCOUNTER — Telehealth: Payer: Self-pay

## 2022-12-22 ENCOUNTER — Other Ambulatory Visit (HOSPITAL_COMMUNITY): Payer: Self-pay

## 2022-12-22 NOTE — Telephone Encounter (Signed)
Pharmacy Patient Advocate Encounter   Received notification from RX Request Messages that prior authorization for Zepbound 2.5mg /0.35ml is required/requested.   Insurance verification completed.   The patient is insured through  [employer]  .   Per test claim: PA required; PA submitted to [employer] via Prompt PA Key/confirmation #/[id number] Status is pending

## 2022-12-23 ENCOUNTER — Other Ambulatory Visit (HOSPITAL_COMMUNITY): Payer: Self-pay

## 2022-12-23 NOTE — Telephone Encounter (Signed)
Pharmacy Patient Advocate Encounter  Received notification from  [employer]  that Prior Authorization for Zepbound 2.5mg /0.57ml has been Denied - Zepbound is a pharmacy benefit plan exclusion - product/drug not covered  PA #/Case ID/Reference #: [id number]

## 2022-12-23 NOTE — Telephone Encounter (Signed)
Just an FYI

## 2022-12-25 NOTE — Telephone Encounter (Signed)
This has been denied in a separate encounter. Please sign off on rx in this encounter as PA team is unable to resolve RX requests. Thank you

## 2023-01-06 NOTE — Telephone Encounter (Signed)
This has been denied in a separate encounter. Please sign off on rx in this encounter as PA team is unable to resolve RX requests. Thank you

## 2023-02-21 ENCOUNTER — Other Ambulatory Visit: Payer: Self-pay | Admitting: Family Medicine

## 2023-02-21 DIAGNOSIS — K219 Gastro-esophageal reflux disease without esophagitis: Secondary | ICD-10-CM

## 2023-02-27 ENCOUNTER — Other Ambulatory Visit: Payer: Self-pay | Admitting: Family Medicine

## 2023-04-15 ENCOUNTER — Ambulatory Visit: Payer: Managed Care, Other (non HMO) | Admitting: Family Medicine

## 2023-04-15 ENCOUNTER — Encounter: Payer: Self-pay | Admitting: Family Medicine

## 2023-04-15 VITALS — BP 126/88 | HR 68 | Temp 98.2°F | Resp 12 | Ht 66.0 in | Wt 237.8 lb

## 2023-04-15 DIAGNOSIS — J0100 Acute maxillary sinusitis, unspecified: Secondary | ICD-10-CM

## 2023-04-15 DIAGNOSIS — I739 Peripheral vascular disease, unspecified: Secondary | ICD-10-CM | POA: Diagnosis not present

## 2023-04-15 DIAGNOSIS — R051 Acute cough: Secondary | ICD-10-CM

## 2023-04-15 MED ORDER — TRIAMCINOLONE ACETONIDE 55 MCG/ACT NA AERO
2.0000 | INHALATION_SPRAY | Freq: Every day | NASAL | 12 refills | Status: AC
Start: 2023-04-15 — End: ?

## 2023-04-15 MED ORDER — PROMETHAZINE-DM 6.25-15 MG/5ML PO SYRP: 5.0000 mL | ORAL_SOLUTION | Freq: Four times a day (QID) | ORAL | 0 refills | Status: DC | PRN

## 2023-04-15 MED ORDER — AZELASTINE HCL 0.1 % NA SOLN
1.0000 | Freq: Two times a day (BID) | NASAL | 12 refills | Status: AC
Start: 2023-04-15 — End: ?

## 2023-04-15 MED ORDER — AMOXICILLIN-POT CLAVULANATE 875-125 MG PO TABS
1.0000 | ORAL_TABLET | Freq: Two times a day (BID) | ORAL | 0 refills | Status: AC
Start: 2023-04-15 — End: ?

## 2023-04-15 NOTE — Progress Notes (Signed)
Established Patient Office Visit  Subjective   Patient ID: Jo Compton, female    DOB: 14-Aug-1965  Age: 57 y.o. MRN: [id number]  Chief Complaint  Patient presents with   Sinus Problem    Face pain, nasal drip, headache.    HPI Discussed the use of AI scribe software for clinical note transcription with the patient, who gave verbal consent to proceed.  History of Present Illness   The patient presents with a chief complaint of worsening nasal congestion and pressure, which she believes is exacerbated by the use of Flonase. She reports increased nasal drainage and discomfort, particularly after using the nasal spray. The patient also describes associated ear pain and pressure, with the right ear feeling worse than the left. She describes the sensation as painful, but it may also be pressure-related. The patient has been using Claritin in addition to the Flonase, but not on a daily basis due to the perceived worsening of symptoms with Flonase use.  The onset of the current episode was approximately one week ago, and the patient notes that these episodes are becoming more frequent. She also reports intermittent dizziness, which she describes as feeling like her head is growing. The patient has not checked her temperature and is unsure if she has had a fever.  In addition to the nasal and ear symptoms, the patient has been experiencing a cough, but it is not severe enough to keep her awake. She works night shifts and sleeps during the day, so she is unsure if the cough is affecting her sleep. She has not been coughing excessively during the consultation.  The patient also mentions a previous issue with low iron and B12 levels, for which she was receiving B12 injections. She has submitted her medical bills for reimbursement and is awaiting confirmation of medical necessity for some items, including the B12 injections.      Patient Active Problem List   Diagnosis Date Noted   RUQ abdominal  pain 08/06/2022   Rectal bleeding 06/25/2022   Rectal pain 06/25/2022   B12 deficiency 06/16/2022   Breast pain, right 08/21/2021   LLQ pain 08/08/2021   Hyperlipidemia 08/08/2021   Lumbar foraminal stenosis 08/08/2021   Recurrent sinus infections 08/08/2021   Acute non-recurrent pansinusitis 05/01/2021   Acute cough 05/01/2021   Chronic constipation 03/19/2021   External hemorrhoid 03/10/2021   Hyperglycemia 10/14/2020   Elevated BP without diagnosis of hypertension 10/14/2020   Seasonal allergies 07/19/2020   Acute otalgia, left 07/19/2020   Morbid obesity (HCC) 07/19/2020   Right lower quadrant abdominal pain 05/20/2020   Gastroesophageal reflux disease 05/20/2020   Hx of adenomatous colonic polyps 08/22/2019   Multinodular goiter 03/09/2019   Leg cramps 11/25/2018   Claudication (HCC) 11/10/2018   Wound infection 01/08/2018   SOB (shortness of breath) 04/15/2017   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/08/2016   Edema, lower extremity 11/08/2016   Menorrhagia 03/20/2015   Depression 12/05/2014   Anemia, iron deficiency 08/03/2013   Anal fissure 01/17/2013   Hemorrhage of rectum and anus 01/17/2013   Obesity (BMI 30-39.9) 12/17/2012   HTN (hypertension) 08/10/2012   Past Medical History:  Diagnosis Date   Anal fissure    Anemia    Anxiety    no meds taken   Arthritis    Depression    Fistula    rectal   Hx of adenomatous colonic polyps 08/22/2019   OAB (overactive bladder)    Rectal bleeding    Past  Surgical History:  Procedure Laterality Date   ABDOMINAL HYSTERECTOMY Bilateral 03/20/2015   Procedure: HYSTERECTOMY ABDOMINAL, EXCISION MESENTERIC NODULE, BLADDER FLAP BIOPSY;  Surgeon: Kirkland Hun, MD;  Location: [facility];  Service: Gynecology;  Laterality: Bilateral;   APPENDECTOMY     BILATERAL SALPINGECTOMY Bilateral 03/20/2015   Procedure: BILATERAL SALPINGECTOMY;  Surgeon: Kirkland Hun, MD;  Location: [facility];  Service: Gynecology;  Laterality:  Bilateral;   COLONOSCOPY     CYSTOSCOPY N/A 03/20/2015   Procedure: CYSTOSCOPY;  Surgeon: Kirkland Hun, MD;  Location: [facility];  Service: Gynecology;  Laterality: N/A;   LAPAROSCOPY N/A 03/20/2015   Procedure: LAPAROSCOPY DIAGNOSTIC;  Surgeon: Kirkland Hun, MD;  Location: [facility];  Service: Gynecology;  Laterality: N/A;   Social History   Tobacco Use   Smoking status: Never   Smokeless tobacco: Never  Vaping Use   Vaping status: Never Used  Substance Use Topics   Alcohol use: Yes    Alcohol/week: 1.0 standard drink of alcohol    Types: 1 Glasses of wine per week   Drug use: No   Social History   Socioeconomic History   Marital status: Married    Spouse name: Not on file   Number of children: 1   Years of education: Not on file   Highest education level: Some college, no degree  Occupational History   Occupation: Tax adviser at [employer]  Tobacco Use   Smoking status: Never   Smokeless tobacco: Never  Vaping Use   Vaping status: Never Used  Substance and Sexual Activity   Alcohol use: Yes    Alcohol/week: 1.0 standard drink of alcohol    Types: 1 Glasses of wine per week   Drug use: No   Sexual activity: Yes    Birth control/protection: None  Other Topics Concern   Not on file  Social History Narrative   ** Merged History Encounter **       Social Drivers of Corporate investment banker Strain: Not on file  Food Insecurity: Not on file  Transportation Needs: Not on file  Physical Activity: Not on file  Stress: Not on file  Social Connections: Unknown (09/09/2021)   Received from [facility]   Social Network    Social Network: Not on file  Intimate Partner Violence: Unknown (07/31/2021)   Received from [facility], [facility]   HITS    Physically Hurt: Not on file    Insult or Talk Down To: Not on file    Threaten Physical Harm: Not on file    Scream or Curse: Not on file   Family Status  Relation Name Status    Mother  Deceased   Sister  Alive   Mat Uncle  Deceased   Mat Uncle  Deceased   Mat Aunt  (Not Specified)   Neg Hx  (Not Specified)  No partnership data on file   Family History  Problem Relation Age of Onset   Hypertension Mother    COPD Mother    Lung cancer Mother    Colon cancer Maternal Uncle    Liver cancer Maternal Uncle    Hypertension Maternal Aunt    Diabetes Maternal Aunt    Esophageal cancer Neg Hx    Rectal cancer Neg Hx    Stomach cancer Neg Hx    No Known Allergies    Review of Systems  Constitutional:  Negative for fever and malaise/fatigue.  HENT:  Positive for congestion and sinus pain.  Eyes:  Negative for blurred vision.  Respiratory:  Positive for cough. Negative for shortness of breath.   Cardiovascular:  Negative for chest pain, palpitations and leg swelling.  Gastrointestinal:  Negative for abdominal pain, blood in stool, nausea and vomiting.  Genitourinary:  Negative for dysuria and frequency.  Musculoskeletal:  Negative for back pain and falls.  Skin:  Negative for rash.  Neurological:  Negative for dizziness, loss of consciousness and headaches.  Endo/Heme/Allergies:  Negative for environmental allergies.  Psychiatric/Behavioral:  Negative for depression. The patient is not nervous/anxious.       Objective:     BP 126/88 (BP Location: Left Arm, Cuff Size: Large)   Pulse 68   Temp 98.2 F (36.8 C) (Oral)   Resp 12   Ht 5\' 6"  (1.676 m)   Wt 237 lb 12.8 oz (107.9 kg)   LMP 02/14/2015 (Approximate)   SpO2 94%   BMI 38.38 kg/m  BP Readings from Last 3 Encounters:  04/15/23 126/88  12/17/22 114/80  11/26/22 120/88   Wt Readings from Last 3 Encounters:  04/15/23 237 lb 12.8 oz (107.9 kg)  12/17/22 229 lb 3.2 oz (104 kg)  11/26/22 227 lb (103 kg)   SpO2 Readings from Last 3 Encounters:  04/15/23 94%  12/17/22 97%  11/26/22 99%      Physical Exam Vitals and nursing note reviewed.  Constitutional:      General: She is not in  acute distress.    Appearance: Normal appearance. She is well-developed.  HENT:     Head: Normocephalic and atraumatic.     Nose: Congestion present.  Eyes:     General: No scleral icterus.       Right eye: No discharge.        Left eye: No discharge.  Cardiovascular:     Rate and Rhythm: Normal rate and regular rhythm.     Heart sounds: No murmur heard. Pulmonary:     Effort: Pulmonary effort is normal. No respiratory distress.     Breath sounds: Normal breath sounds.  Musculoskeletal:        General: Normal range of motion.     Cervical back: Normal range of motion and neck supple.     Right lower leg: No edema.     Left lower leg: No edema.  Lymphadenopathy:     Cervical: Cervical adenopathy present.  Skin:    General: Skin is warm and dry.  Neurological:     Mental Status: She is alert and oriented to person, place, and time.  Psychiatric:        Mood and Affect: Mood normal.        Behavior: Behavior normal.        Thought Content: Thought content normal.        Judgment: Judgment normal.      No results found for any visits on 04/15/23.  Last CBC Lab Results  Component Value Date   WBC 5.3 11/26/2022   HGB 14.3 11/26/2022   HCT 45.0 11/26/2022   MCV 77.6 (L) 11/26/2022   MCH 24.8 (L) 08/10/2022   RDW 14.1 11/26/2022   PLT 281.0 11/26/2022   Last metabolic panel Lab Results  Component Value Date   GLUCOSE 94 11/26/2022   NA 138 11/26/2022   K 4.0 11/26/2022   CL 101 11/26/2022   CO2 29 11/26/2022   BUN 13 11/26/2022   CREATININE 0.92 11/26/2022   GFR 70.31 11/26/2022   CALCIUM 10.0 11/26/2022   PHOS  3.3 11/25/2018   PROT 7.3 11/26/2022   ALBUMIN 4.4 11/26/2022   BILITOT 0.4 11/26/2022   ALKPHOS 67 11/26/2022   AST 20 11/26/2022   ALT 20 11/26/2022   ANIONGAP 9 08/10/2022   Last lipids Lab Results  Component Value Date   CHOL 259 (H) 11/26/2022   HDL 54.40 11/26/2022   LDLCALC 182 (H) 11/26/2022   TRIG 113.0 11/26/2022   CHOLHDL 5  11/26/2022   Last hemoglobin A1c Lab Results  Component Value Date   HGBA1C 6.1 10/14/2020   Last thyroid functions Lab Results  Component Value Date   TSH 1.03 11/26/2022   Last vitamin D Lab Results  Component Value Date   VD25OH 23.30 (L) 11/26/2022   Last vitamin B12 and Folate Lab Results  Component Value Date   VITAMINB12 206 (L) 11/26/2022      The 10-year ASCVD risk score (Arnett DK, et al., 2019) is: 6%    Assessment & Plan:   Problem List Items Addressed This Visit       Unprioritized   Acute cough   Relevant Medications   promethazine-dextromethorphan (PROMETHAZINE-DM) 6.25-15 MG/5ML syrup   Other Visit Diagnoses       Acute non-recurrent maxillary sinusitis    -  Primary   Relevant Medications   triamcinolone (NASACORT) 55 MCG/ACT AERO nasal inhaler   azelastine (ASTELIN) 0.1 % nasal spray   amoxicillin-clavulanate (AUGMENTIN) 875-125 MG tablet   promethazine-dextromethorphan (PROMETHAZINE-DM) 6.25-15 MG/5ML syrup     Assessment and Plan    Chronic Rhinosinusitis   Her symptoms of chronic rhinosinusitis have worsened despite using Flonase, experiencing increased nasal drainage, ear pain, and nasal pressure for the past week. Examination indicates possible fluid in ears and significant nasal congestion. We discussed transitioning to a different steroid nasal spray and incorporating an antihistamine nasal spray, with a preference to try these new medications before exploring other treatments. We will prescribe a different steroid nasal spray, Astelin as the antihistamine nasal spray, and an antibiotic.  Cough   She reports occasional coughing that does not disrupt sleep, although her night work and daytime sleep schedule may affect symptom perception. After discussing, we agreed on a cough syrup for symptom management. We will prescribe a cough syrup.  Vitamin B12 Deficiency   She requires B12 injections due to low iron levels and needs a note for  insurance to confirm the medical necessity of these injections. We discussed the importance of B12 in preventing anemia and maintaining energy levels. We will provide a note stating the medical necessity of B12 injections.  General Health Maintenance   She needs documentation for insurance regarding B12 injections and vitamin D. We provided information on the significance of these supplements for overall health and preventing deficiencies. We will provide documentation for insurance purposes regarding B12 injections and vitamin D.        Return if symptoms worsen or fail to improve.    Donato Schultz, DO

## 2023-05-31 ENCOUNTER — Other Ambulatory Visit: Payer: Self-pay | Admitting: Family Medicine

## 2023-05-31 DIAGNOSIS — R030 Elevated blood-pressure reading, without diagnosis of hypertension: Secondary | ICD-10-CM

## 2023-06-04 ENCOUNTER — Encounter (HOSPITAL_BASED_OUTPATIENT_CLINIC_OR_DEPARTMENT_OTHER): Payer: Self-pay

## 2023-06-04 ENCOUNTER — Other Ambulatory Visit: Payer: Self-pay

## 2023-06-04 ENCOUNTER — Emergency Department (HOSPITAL_COMMUNITY): Payer: Managed Care, Other (non HMO)

## 2023-06-04 ENCOUNTER — Emergency Department (HOSPITAL_BASED_OUTPATIENT_CLINIC_OR_DEPARTMENT_OTHER): Payer: Managed Care, Other (non HMO)

## 2023-06-04 ENCOUNTER — Emergency Department (HOSPITAL_BASED_OUTPATIENT_CLINIC_OR_DEPARTMENT_OTHER)
Admission: EM | Admit: 2023-06-04 | Discharge: 2023-06-04 | Disposition: A | Discharge status: Home / Self Care | Payer: Managed Care, Other (non HMO) | Attending: Emergency Medicine | Admitting: Emergency Medicine

## 2023-06-04 DIAGNOSIS — R531 Weakness: Secondary | ICD-10-CM | POA: Insufficient documentation

## 2023-06-04 DIAGNOSIS — R200 Anesthesia of skin: Secondary | ICD-10-CM | POA: Insufficient documentation

## 2023-06-04 DIAGNOSIS — M5412 Radiculopathy, cervical region: Secondary | ICD-10-CM

## 2023-06-04 DIAGNOSIS — R791 Abnormal coagulation profile: Secondary | ICD-10-CM | POA: Insufficient documentation

## 2023-06-04 LAB — CBC WITH DIFFERENTIAL/PLATELET
Abs Immature Granulocytes: 0.01 10*3/uL (ref 0.00–0.07)
Basophils Absolute: 0 10*3/uL (ref 0.0–0.1)
Basophils Relative: 0 %
Eosinophils Absolute: 0.1 10*3/uL (ref 0.0–0.5)
Eosinophils Relative: 1 %
HCT: 42.9 % (ref 36.0–46.0)
Hemoglobin: 13.9 g/dL (ref 12.0–15.0)
Immature Granulocytes: 0 %
Lymphocytes Relative: 43 %
Lymphs Abs: 2 10*3/uL (ref 0.7–4.0)
MCH: 24.9 pg — ABNORMAL LOW (ref 26.0–34.0)
MCHC: 32.4 g/dL (ref 30.0–36.0)
MCV: 76.7 fL — ABNORMAL LOW (ref 80.0–100.0)
Monocytes Absolute: 0.5 10*3/uL (ref 0.1–1.0)
Monocytes Relative: 10 %
Neutro Abs: 2 10*3/uL (ref 1.7–7.7)
Neutrophils Relative %: 46 %
Platelets: 269 10*3/uL (ref 150–400)
RBC: 5.59 MIL/uL — ABNORMAL HIGH (ref 3.87–5.11)
RDW: 14.2 % (ref 11.5–15.5)
WBC: 4.6 10*3/uL (ref 4.0–10.5)
nRBC: 0 % (ref 0.0–0.2)

## 2023-06-04 LAB — COMPREHENSIVE METABOLIC PANEL
ALT: 27 U/L (ref 0–44)
AST: 23 U/L (ref 15–41)
Albumin: 4 g/dL (ref 3.5–5.0)
Alkaline Phosphatase: 59 U/L (ref 38–126)
Anion gap: 7 (ref 5–15)
BUN: 10 mg/dL (ref 6–20)
CO2: 26 mmol/L (ref 22–32)
Calcium: 10.1 mg/dL (ref 8.9–10.3)
Chloride: 103 mmol/L (ref 98–111)
Creatinine, Ser: 0.88 mg/dL (ref 0.44–1.00)
GFR, Estimated: >60 mL/min (ref >60)
Glucose, Bld: 99 mg/dL (ref 70–99)
Potassium: 3.8 mmol/L (ref 3.5–5.1)
Sodium: 136 mmol/L (ref 135–145)
Total Bilirubin: 0.6 mg/dL (ref 0.0–1.2)
Total Protein: 7.4 g/dL (ref 6.5–8.1)

## 2023-06-04 LAB — RAPID URINE DRUG SCREEN, HOSP PERFORMED
Amphetamines: NOT DETECTED
Barbiturates: NOT DETECTED
Benzodiazepines: NOT DETECTED
Cocaine: NOT DETECTED
Opiates: NOT DETECTED
Tetrahydrocannabinol: NOT DETECTED

## 2023-06-04 LAB — URINALYSIS, ROUTINE W REFLEX MICROSCOPIC
Bilirubin Urine: NEGATIVE
Glucose, UA: NEGATIVE mg/dL
Hgb urine dipstick: NEGATIVE
Ketones, ur: NEGATIVE mg/dL
Leukocytes,Ua: NEGATIVE
Nitrite: NEGATIVE
Protein, ur: NEGATIVE mg/dL
Specific Gravity, Urine: <=1.005 (ref 1.005–1.030)
pH: 5 (ref 5.0–8.0)

## 2023-06-04 LAB — PROTIME-INR
INR: 1 (ref 0.8–1.2)
Prothrombin Time: 13.1 s (ref 11.4–15.2)

## 2023-06-04 LAB — MAGNESIUM: Magnesium: 2 mg/dL (ref 1.7–2.4)

## 2023-06-04 LAB — CBG MONITORING, ED: Glucose-Capillary: 101 mg/dL — ABNORMAL HIGH (ref 70–99)

## 2023-06-04 MED ORDER — CLOPIDOGREL BISULFATE 300 MG PO TABS
300.0000 mg | ORAL_TABLET | Freq: Once | ORAL | Status: AC
  Administered 2023-06-04: 300 mg via ORAL
  Filled 2023-06-04: qty 1

## 2023-06-04 MED ORDER — IOHEXOL 350 MG/ML SOLN
75.0000 mL | Freq: Once | INTRAVENOUS | Status: AC | PRN
  Administered 2023-06-04: 75 mL via INTRAVENOUS

## 2023-06-04 MED ORDER — PREDNISONE 10 MG PO TABS: 20.0000 mg | ORAL_TABLET | Freq: Every day | ORAL | 0 refills | Status: AC

## 2023-06-04 MED ORDER — METHYLPREDNISOLONE SODIUM SUCC 125 MG IJ SOLR
125.0000 mg | Freq: Once | INTRAMUSCULAR | Status: AC
  Administered 2023-06-04: 125 mg via INTRAVENOUS
  Filled 2023-06-04: qty 2

## 2023-06-04 MED ORDER — ASPIRIN 81 MG PO CHEW
81.0000 mg | CHEWABLE_TABLET | Freq: Every day | ORAL | Status: DC
  Administered 2023-06-04: 81 mg via ORAL
  Filled 2023-06-04: qty 1

## 2023-06-04 MED ORDER — GADOBUTROL 1 MMOL/ML IV SOLN
10.0000 mL | Freq: Once | INTRAVENOUS | Status: AC | PRN
  Administered 2023-06-04: 10 mL via INTRAVENOUS

## 2023-06-04 NOTE — Discharge Instructions (Signed)
 Today you were diagnosed with cervical radiculopathy.  This is where you have narrowing or  stenosis of the spine in the neck that causes compression or  pinching of the nerves that provide sensation and motor function to the upper extremity.  Please call the phone number provided to establish care with a neurosurgeon for follow-up.  Steroids have been sent to your pharmacy.

## 2023-06-04 NOTE — ED Notes (Signed)
Pt called for vitals, no answer 

## 2023-06-04 NOTE — ED Provider Notes (Signed)
 3:50 PM Patient signed out to me by previous ED physician. Pt is a 58 yo female presenting for weakness/numbness.   Transferred from [facility] for MRI.   Physical Exam  BP (!) 128/91   Pulse (!) 57   Temp 98.5 F (36.9 C) (Oral)   Resp 17   Wt 108.7 kg   LMP 02/14/2015 (Approximate)   SpO2 97%   BMI 38.69 kg/m   Physical Exam  Procedures  Procedures  ED Course / MDM   Clinical Course as of 06/04/23 1550  Fri Jun 04, 2023  0907 Glucose-Capillary(!): 101 [HN]  9077 CBC with Differential(!) Unremarkable in the context of this patient's presentation  [HN]  0922 Urinalysis, Routine w reflex microscopic -Urine, Clean Catch wnl [HN]  0929 Protime-INR wnl [HN]  1007 Rapid urine drug screen (hospital performed) neg [HN]  1007 Magnesium : 2.0 wnl [HN]  1007 Comprehensive metabolic panel wnl [HN]  1048 CT ANGIO HEAD NECK W WO CM IMPRESSION: Non-contrast head CT:  No evidence of an acute intracranial abnormality.  CTA neck:  The common carotid, internal carotid and vertebral arteries are patent within the neck without stenosis or significant atherosclerotic disease. No evidence of dissection.  CTA head:  No proximal intracranial large vessel occlusion or high-grade proximal arterial stenosis identified.   [HN]  1102 D/w Dr. Jerrie with neurology who recommended aspirin  81 mg, plavix  300 mg, and transfer to [facility] for MRI brain and c-spine with and without contrast.  [HN]  1105 Accepted by Dr. Dean EDP for transfer to [facility] [HN]  1210 Patient called her husband and he will drive her to [facility] to go by POV [HN]    Clinical Course User Index [HN] Franklyn Sid SAILOR, MD   Medical Decision Making Amount and/or Complexity of Data Reviewed Labs: ordered. Decision-making details documented in ED Course. Radiology: ordered. Decision-making details documented in ED Course.  Risk OTC drugs. Prescription drug management.   MRI demonstrates: 1. No acute intracranial process. No  evidence of infarct or demyelinating disease. 2. C6-C7 moderate spinal canal stenosis with moderate to severe right and mild left neural foraminal narrowing. 3. C5-C6 mild-to-moderate spinal canal stenosis with moderate right and mild left neural foraminal narrowing. 4. C2-C3 mild spinal canal stenosis. 5. C3-C4 mild right neural foraminal narrowing.   Lindzen with neurology has reviewed. Safe for DC and f/u with neurosurgery.   On my physical evaluation patient has complete resolution of symptoms.  She has normal muscle strength in the upper extremities and normal sensation.  Symptoms woke her from sleep and it is my concern that her sleeping position likely further provoked or worsened her radiculopathy from neural foraminal narrowing/stenosis.  Findings discussed in detail with patient with recommendations for follow-up with neurosurgery.  Steroids given in ED and prednisone  sent to pharmacy.SABRA Pereyra, Bernarda SQUIBB, DO 06/04/23 1620

## 2023-06-04 NOTE — ED Triage Notes (Signed)
 Pt says that her right side has become numb and limp starting yesterday at about 1700. States that it seemed to improve, but then this morning it had worsened again.   Anastacio Balm, RN

## 2023-06-04 NOTE — ED Notes (Signed)
 Patient going POV to [facility] for an MRI--Dr. Scarlette Currier accepting

## 2023-06-04 NOTE — ED Notes (Signed)
 Pt sent POV to [facility] ED for MRI. Report called to charge RN at [facility]. Pt's PIV secured, this was okay per Dr. Drury Geralds. Pt's husband driving her over.   Anastacio Balm, RN

## 2023-06-04 NOTE — ED Provider Notes (Signed)
 [facility] [place] Provider Note   CSN: [id number] Arrival date & time: 06/04/23  9182     History  Chief Complaint  Patient presents with  . Numbness    Jo Compton is a 58 y.o. female with PMH as listed below who presents with R-sided weakness/numbness. She states she woke up yesterday after sleeping from her night shift and felt like her R arm was limp or weak. She could move it but it felt weaker than the left side. LKN 3 pm yesterday. The bottom of her R foot also felt somewhat numb, and her R arm/R leg felt mildly numb as well. No headache, visual changes, slurred speech, or confusion. She gets headaches periodically but didn't have thunderclap headache or neck pain during that time. She went to work last night again and her sxs improved somewhat but got home this AM and her symptoms were still there so she presented to ED. No h/o similar. No h/o CVA. Doesn't take blood thinners. No head trauma or chest pain. Denies illicit drugs.   Past Medical History:  Diagnosis Date  . Anal fissure   . Anemia   . Anxiety    no meds taken  . Arthritis   . Depression   . Fistula    rectal  . Hx of adenomatous colonic polyps 08/22/2019  . OAB (overactive bladder)   . Rectal bleeding        Home Medications Prior to Admission medications   Medication Sig Start Date End Date Taking? Authorizing Provider  AMBULATORY NON FORMULARY MEDICATION Medication Name: Diltiazem  2% gel:Lidocaine  5% -Using your index finger, apply a small amount of medication inside the rectum up to your first knuckle/joint three times daily x 6-8 weeks. 07/03/22   Zehr, Jessica D, PA-C  amoxicillin -clavulanate (AUGMENTIN ) 875-125 MG tablet Take 1 tablet by mouth 2 (two) times daily. 04/15/23   Antonio Cyndee Jamee JONELLE, DO  azelastine  (ASTELIN ) 0.1 % nasal spray Place 1 spray into both nostrils 2 (two) times daily. Use in each nostril as directed 04/15/23   Antonio Cyndee, Jamee JONELLE, DO   diltiazem  2 % GEL With lidocaine  5% - apply 3 times daily for 6-10 weeks 06/25/22   Zehr, Jessica D, PA-C  hydrochlorothiazide  (MICROZIDE ) 12.5 MG capsule TAKE 1 CAPSULE BY MOUTH EVERY DAY 05/31/23   Antonio Cyndee, Yvonne R, DO  hydrocortisone  (ANUSOL -HC) 2.5 % rectal cream Place 1 Application rectally daily as needed for hemorrhoids or anal itching. 06/15/22   Craig Alan JONELLE, PA-C  ibuprofen  (ADVIL ) 800 MG tablet Take 1 tablet (800 mg total) by mouth every 8 (eight) hours as needed. 11/25/18   Lowne Chase, Yvonne R, DO  loratadine  (CLARITIN ) 10 MG tablet Take 1 tablet (10 mg total) by mouth daily. 11/26/22   Antonio Cyndee Jamee JONELLE, DO  ofloxacin  (FLOXIN  OTIC) 0.3 % OTIC solution Place 5 drops into the left ear daily. 07/10/20   Antonio Cyndee Jamee JONELLE, DO  omeprazole  (PRILOSEC ) 20 MG capsule Take 1 capsule (20 mg total) by mouth daily. 04/09/22   Antonio Cyndee Jamee R, DO  pantoprazole  (PROTONIX ) 20 MG tablet TAKE 1 TABLET BY MOUTH EVERY DAY 02/22/23   Antonio Cyndee, Yvonne R, DO  promethazine -dextromethorphan (PROMETHAZINE -DM) 6.25-15 MG/5ML syrup Take 5 mLs by mouth 4 (four) times daily as needed. 04/15/23   Antonio Cyndee Jamee R, DO  rosuvastatin  (CRESTOR ) 20 MG tablet TAKE 1 TABLET BY MOUTH EVERY DAY 03/01/23   Lowne Chase, Yvonne R, DO  SYRINGE-NEEDLE, DISP, 3 ML (LUER LOCK SAFETY SYRINGES) 25G X 1 3 ML MISC Use to inject B12 weekly for 4 weeks and then monthly Patient not taking: Reported on 04/15/2023 04/15/22   Antonio Cyndee Rockers R, DO  triamcinolone  (NASACORT ) 55 MCG/ACT AERO nasal inhaler Place 2 sprays into the nose daily. 04/15/23   Lowne Chase, Yvonne R, DO      Allergies    Patient has no known allergies.    Review of Systems   Review of Systems A 10 point review of systems was performed and is negative unless otherwise reported in HPI.  Physical Exam Updated Vital Signs BP (!) 132/93   Pulse 72   Temp 98.5 F (36.9 C) (Oral)   Resp 18   Wt 108.7 kg   LMP 02/14/2015 (Approximate)    SpO2 98%   BMI 38.69 kg/m  Physical Exam General: Normal appearing female, lying in bed.  HEENT: NCAT, PERRLA, EOMI, no nystagmus. Sclera anicteric, MMM, trachea midline.  Cardiology: RRR, no murmurs/rubs/gallops. BL radial and DP pulses equal bilaterally.  Resp: Normal respiratory rate and effort. CTAB, no wheezes, rhonchi, crackles.  Abd: Soft, non-tender, non-distended. No rebound tenderness or guarding.  GU: Deferred. MSK: No peripheral edema or signs of trauma. Extremities without deformity or TTP. No cyanosis or clubbing. Skin: warm, dry. Neuro: A&Ox4, CNs II-XII grossly intact. 5/5 strength all extremities. Sensation grossly intact in all extremities, states it feels the same on both sides to light touch, but also states that she feels somewhat numb. Normal speech. Normal FTN bilaterally. Names thumb correctly.  Psych: Normal mood and affect.   1a  Level of consciousness: 0=alert; keenly responsive  1b. LOC questions:  0=Performs both tasks correctly  1c. LOC commands: 0=Performs both tasks correctly  2.  Best Gaze: 0=normal  3.  Visual: 0=No visual loss  4. Facial Palsy: 0=Normal symmetric movement  5a.  Motor left arm: 0=No drift, limb holds 90 (or 45) degrees for full 10 seconds  5b.  Motor right arm: 0=No drift, limb holds 90 (or 45) degrees for full 10 seconds  6a. motor left leg: 0=No drift, limb holds 90 (or 45) degrees for full 10 seconds  6b  Motor right leg:  0=No drift, limb holds 90 (or 45) degrees for full 10 seconds  7. Limb Ataxia: 0=Absent  8.  Sensory: 0=Normal; no sensory loss  9. Best Language:  0=No aphasia, normal  10. Dysarthria: 0=Normal  11. Extinction and Inattention: 0=No abnormality   Total:   0        ED Results / Procedures / Treatments   Labs (all labs ordered are listed, but only abnormal results are displayed) Labs Reviewed  CBC WITH DIFFERENTIAL/PLATELET - Abnormal; Notable for the following components:      Result Value   RBC 5.59  (*)    MCV 76.7 (*)    MCH 24.9 (*)    All other components within normal limits  CBG MONITORING, ED - Abnormal; Notable for the following components:   Glucose-Capillary 101 (*)    All other components within normal limits  COMPREHENSIVE METABOLIC PANEL  MAGNESIUM   PROTIME-INR  URINALYSIS, ROUTINE W REFLEX MICROSCOPIC  RAPID URINE DRUG SCREEN, HOSP PERFORMED    EKG EKG Interpretation Date/Time:  Friday June 04 2023 08:53:51 EST Ventricular Rate:  63 PR Interval:  172 QRS Duration:  82 QT Interval:  425 QTC Calculation: 435 R Axis:   -6  Text Interpretation: Sinus rhythm Abnormal R-wave progression, early  transition Baseline wander in lead(s) V2 Confirmed by Franklyn Gills [phone]) on 06/04/2023 9:22:40 AM  Radiology CT ANGIO HEAD NECK W WO CM Result Date: 06/04/2023 CLINICAL DATA:  Provided history: Neuro deficit, acute, stroke suspected. Right-sided weakness/numbness. EXAM: CT ANGIOGRAPHY HEAD AND NECK WITH AND WITHOUT CONTRAST TECHNIQUE: Multidetector CT imaging of the head and neck was performed using the standard protocol during bolus administration of intravenous contrast. Multiplanar CT image reconstructions and MIPs were obtained to evaluate the vascular anatomy. Carotid stenosis measurements (when applicable) are obtained utilizing NASCET criteria, using the distal internal carotid diameter as the denominator. RADIATION DOSE REDUCTION: This exam was performed according to the departmental dose-optimization program which includes automated exposure control, adjustment of the mA and/or kV according to patient size and/or use of iterative reconstruction technique. CONTRAST:  75mL OMNIPAQUE  IOHEXOL  350 MG/ML SOLN COMPARISON:  Non-contrast head CT 12/06/2018. FINDINGS: CT HEAD FINDING Brain: Cerebral volume is normal. There is no acute intracranial hemorrhage. No demarcated cortical infarct. No extra-axial fluid collection. No evidence of an intracranial mass. No midline shift.  Vascular: No hyperdense vessel. Skull: No calvarial fracture or aggressive osseous lesion. Sinuses/Orbits: No orbital mass or acute orbital finding. No significant paranasal sinus disease. Review of the MIP images confirms the above findings CTA NECK FINDINGS Aortic arch: Standard aortic branching. The visualized thoracic aorta is normal in caliber. No hemodynamically significant innominate or proximal subclavian artery stenosis. Right carotid system: CCA and ICA patent within the neck without stenosis or significant atherosclerotic disease. No evidence of dissection. Left carotid system: CCA and ICA patent within the neck without stenosis or significant atherosclerotic disease. No evidence of dissection. Vertebral arteries: Codominant and patent within the neck without stenosis or significant atherosclerotic disease. No evidence of dissection. Skeleton: Cervical spondylosis. No acute fracture or aggressive osseous lesion. Other neck: No neck mass or cervical lymphadenopathy. Upper chest: No consolidation within the imaged lung apices. Review of the MIP images confirms the above findings CTA HEAD FINDINGS Anterior circulation: The intracranial internal carotid arteries are patent. The M1 middle cerebral arteries are patent. Asymmetrically early branching of the right middle cerebral artery M1 segment. No M2 proximal branch occlusion or high-grade proximal stenosis. The anterior cerebral arteries are patent. No intracranial aneurysm is identified. Posterior circulation: The intracranial vertebral arteries are patent. The basilar artery is patent. The posterior cerebral arteries are patent. Hypoplastic left P1 segment with sizable left posterior communicating artery. The right posterior communicating artery is diminutive or absent. Venous sinuses: Within the limitations of contrast timing, no convincing thrombus. Anatomic variants: As described. Review of the MIP images confirms the above findings IMPRESSION:  Non-contrast head CT: No evidence of an acute intracranial abnormality. CTA neck: The common carotid, internal carotid and vertebral arteries are patent within the neck without stenosis or significant atherosclerotic disease. No evidence of dissection. CTA head: No proximal intracranial large vessel occlusion or high-grade proximal arterial stenosis identified. Electronically Signed   By: Rockey Childs D.O.   On: 06/04/2023 10:44    Procedures Procedures    Medications Ordered in ED Medications  clopidogrel  (PLAVIX ) tablet 300 mg (has no administration in time range)  aspirin  chewable tablet 81 mg (has no administration in time range)  iohexol  (OMNIPAQUE ) 350 MG/ML injection 75 mL (75 mLs Intravenous Contrast Given 06/04/23 0955)    ED Course/ Medical Decision Making/ A&P                          Medical  Decision Making Amount and/or Complexity of Data Reviewed Labs: ordered. Decision-making details documented in ED Course. Radiology: ordered. Decision-making details documented in ED Course.  Risk OTC drugs. Prescription drug management.    This patient presents to the ED for concern of R-sided numnbess/weakness, this involves an extensive number of treatment options, and is a complaint that carries with it a high risk of complications and morbidity.  I considered the following differential and admission for this acute, potentially life threatening condition.   MDM:    Given the acute onset of neurological symptoms, stroke is the most concerning etiology of these acute symptoms. The neuro exam is wnl.  Also consider electrolyte derangement, complex migraine, carotid/vertebral artery dissection, todd's paralysis, demyelinating syndrome, space occupying lesions. No h/o seizures. Pt LKN was 3 pm yesterday, she is out of TNK window and NIHSS is measured at 0. Will obtain CTH/CTA and labs.   LKN: 3 pm yesterday Glucose: 101 mg/dL AC: No BP: 867/06   Clinical Course as of 06/04/23 1211   Fri Jun 04, 2023  0907 Glucose-Capillary(!): 101 [HN]  9077 CBC with Differential(!) Unremarkable in the context of this patient's presentation  [HN]  0922 Urinalysis, Routine w reflex microscopic -Urine, Clean Catch wnl [HN]  0929 Protime-INR wnl [HN]  1007 Rapid urine drug screen (hospital performed) neg [HN]  1007 Magnesium : 2.0 wnl [HN]  1007 Comprehensive metabolic panel wnl [HN]  1048 CT ANGIO HEAD NECK W WO CM IMPRESSION: Non-contrast head CT:  No evidence of an acute intracranial abnormality.  CTA neck:  The common carotid, internal carotid and vertebral arteries are patent within the neck without stenosis or significant atherosclerotic disease. No evidence of dissection.  CTA head:  No proximal intracranial large vessel occlusion or high-grade proximal arterial stenosis identified.   [HN]  1102 D/w Dr. Jerrie with neurology who recommended aspirin  81 mg, plavix  300 mg, and transfer to [facility] for MRI brain and c-spine with and without contrast.  [HN]  1105 Accepted by Dr. Dean EDP for transfer to [facility] [HN]  1210 Patient called her husband and he will drive her to [facility] to go by POV [HN]    Clinical Course User Index [HN] Franklyn Sid SAILOR, MD    Labs: I Ordered, and personally interpreted labs.  The pertinent results include:  those listed above  Imaging Studies ordered: I ordered imaging studies including CTH, CTA H&N I independently visualized and interpreted imaging. I agree with the radiologist interpretation  Additional history obtained from chart review.   Cardiac Monitoring: .The patient was maintained on a cardiac monitor.  I personally viewed and interpreted the cardiac monitored which showed an underlying rhythm of: NSR  Reevaluation: After the interventions noted above, I reevaluated the patient and found that they have :stayed the same  Social Determinants of Health: .Lives independently  Disposition:  [facility]   Co  morbidities that complicate the patient evaluation . Past Medical History:  Diagnosis Date  . Anal fissure   . Anemia   . Anxiety    no meds taken  . Arthritis   . Depression   . Fistula    rectal  . Hx of adenomatous colonic polyps 08/22/2019  . OAB (overactive bladder)   . Rectal bleeding      Medicines Meds ordered this encounter  Medications  . iohexol  (OMNIPAQUE ) 350 MG/ML injection 75 mL  . clopidogrel  (PLAVIX ) tablet 300 mg  . aspirin  chewable tablet 81 mg    I have  reviewed the patients home medicines and have made adjustments as needed  Problem List / ED Course: Problem List Items Addressed This Visit   None Visit Diagnoses       Right sided numbness    -  Primary                   This note was created using dictation software, which may contain spelling or grammatical errors.    Franklyn Sid SAILOR, MD 06/04/23 [phone]

## 2023-06-29 ENCOUNTER — Other Ambulatory Visit (HOSPITAL_BASED_OUTPATIENT_CLINIC_OR_DEPARTMENT_OTHER): Payer: Self-pay | Admitting: Neurosurgery

## 2023-06-29 DIAGNOSIS — M48062 Spinal stenosis, lumbar region with neurogenic claudication: Secondary | ICD-10-CM

## 2023-07-01 ENCOUNTER — Telehealth (HOSPITAL_BASED_OUTPATIENT_CLINIC_OR_DEPARTMENT_OTHER): Payer: Self-pay

## 2023-07-27 ENCOUNTER — Telehealth (HOSPITAL_BASED_OUTPATIENT_CLINIC_OR_DEPARTMENT_OTHER): Payer: Self-pay

## 2023-07-28 ENCOUNTER — Ambulatory Visit (HOSPITAL_BASED_OUTPATIENT_CLINIC_OR_DEPARTMENT_OTHER)

## 2023-09-07 ENCOUNTER — Ambulatory Visit (INDEPENDENT_AMBULATORY_CARE_PROVIDER_SITE_OTHER): Admitting: Family Medicine

## 2023-09-07 ENCOUNTER — Other Ambulatory Visit (HOSPITAL_COMMUNITY)
Admission: RE | Admit: 2023-09-07 | Discharge: 2023-09-07 | Disposition: A | Discharge status: Home / Self Care | Source: Ambulatory Visit | Attending: Family Medicine | Admitting: Family Medicine

## 2023-09-07 ENCOUNTER — Encounter: Payer: Self-pay | Admitting: Family Medicine

## 2023-09-07 VITALS — BP 138/90 | HR 68 | Temp 98.1°F | Resp 18 | Ht 66.0 in | Wt 232.8 lb

## 2023-09-07 DIAGNOSIS — N760 Acute vaginitis: Secondary | ICD-10-CM | POA: Diagnosis not present

## 2023-09-07 DIAGNOSIS — B9689 Other specified bacterial agents as the cause of diseases classified elsewhere: Secondary | ICD-10-CM | POA: Insufficient documentation

## 2023-09-07 DIAGNOSIS — R350 Frequency of micturition: Secondary | ICD-10-CM | POA: Diagnosis not present

## 2023-09-07 DIAGNOSIS — N898 Other specified noninflammatory disorders of vagina: Secondary | ICD-10-CM | POA: Diagnosis present

## 2023-09-07 DIAGNOSIS — R300 Dysuria: Secondary | ICD-10-CM | POA: Diagnosis not present

## 2023-09-07 DIAGNOSIS — Z6837 Body mass index (BMI) 37.0-37.9, adult: Secondary | ICD-10-CM

## 2023-09-07 LAB — LIPID PANEL
Cholesterol: 203 mg/dL — ABNORMAL HIGH (ref 0–200)
HDL: 52.1 mg/dL (ref >39.00)
LDL Cholesterol: 131 mg/dL — ABNORMAL HIGH (ref 0–99)
NonHDL: 150.79
Total CHOL/HDL Ratio: 4
Triglycerides: 101 mg/dL (ref 0.0–149.0)
VLDL: 20.2 mg/dL (ref 0.0–40.0)

## 2023-09-07 LAB — CBC WITH DIFFERENTIAL/PLATELET
Basophils Absolute: 0 10*3/uL (ref 0.0–0.1)
Basophils Relative: 0.3 % (ref 0.0–3.0)
Eosinophils Absolute: 0.1 10*3/uL (ref 0.0–0.7)
Eosinophils Relative: 1.8 % (ref 0.0–5.0)
HCT: 43.1 % (ref 36.0–46.0)
Hemoglobin: 13.9 g/dL (ref 12.0–15.0)
Lymphocytes Relative: 40.6 % (ref 12.0–46.0)
Lymphs Abs: 2 10*3/uL (ref 0.7–4.0)
MCHC: 32.3 g/dL (ref 30.0–36.0)
MCV: 77.5 fl — ABNORMAL LOW (ref 78.0–100.0)
Monocytes Absolute: 0.5 10*3/uL (ref 0.1–1.0)
Monocytes Relative: 9.2 % (ref 3.0–12.0)
Neutro Abs: 2.4 10*3/uL (ref 1.4–7.7)
Neutrophils Relative %: 48.1 % (ref 43.0–77.0)
Platelets: 285 10*3/uL (ref 150.0–400.0)
RBC: 5.56 Mil/uL — ABNORMAL HIGH (ref 3.87–5.11)
RDW: 14.5 % (ref 11.5–15.5)
WBC: 4.9 10*3/uL (ref 4.0–10.5)

## 2023-09-07 LAB — COMPREHENSIVE METABOLIC PANEL WITH GFR
ALT: 25 U/L (ref 0–35)
AST: 20 U/L (ref 0–37)
Albumin: 4.3 g/dL (ref 3.5–5.2)
Alkaline Phosphatase: 79 U/L (ref 39–117)
BUN: 9 mg/dL (ref 6–23)
CO2: 26 meq/L (ref 19–32)
Calcium: 9.6 mg/dL (ref 8.4–10.5)
Chloride: 105 meq/L (ref 96–112)
Creatinine, Ser: 0.82 mg/dL (ref 0.40–1.20)
GFR: 80.28 mL/min (ref >60.00)
Glucose, Bld: 86 mg/dL (ref 70–99)
Potassium: 4.3 meq/L (ref 3.5–5.1)
Sodium: 140 meq/L (ref 135–145)
Total Bilirubin: 0.4 mg/dL (ref 0.2–1.2)
Total Protein: 6.8 g/dL (ref 6.0–8.3)

## 2023-09-07 LAB — POC URINALSYSI DIPSTICK (AUTOMATED)
Blood, UA: NEGATIVE
Glucose, UA: NEGATIVE
Ketones, UA: NEGATIVE
Leukocytes, UA: NEGATIVE
Nitrite, UA: NEGATIVE
Protein, UA: NEGATIVE
Spec Grav, UA: 1.025 (ref 1.010–1.025)
Urobilinogen, UA: 0.2 U/dL
pH, UA: 5 (ref 5.0–8.0)

## 2023-09-07 LAB — HEMOGLOBIN A1C: Hgb A1c MFr Bld: 6 % (ref 4.6–6.5)

## 2023-09-07 LAB — TSH: TSH: 0.85 u[IU]/mL (ref 0.35–5.50)

## 2023-09-07 MED ORDER — SEMAGLUTIDE-WEIGHT MANAGEMENT 0.25 MG/0.5ML ~~LOC~~ SOAJ: 0.2500 mg | SUBCUTANEOUS | 0 refills | Status: AC

## 2023-09-07 MED ORDER — SEMAGLUTIDE-WEIGHT MANAGEMENT 1 MG/0.5ML ~~LOC~~ SOAJ: 1.0000 mg | SUBCUTANEOUS | 0 refills | Status: AC

## 2023-09-07 MED ORDER — METRONIDAZOLE 500 MG PO TABS
500.0000 mg | ORAL_TABLET | Freq: Two times a day (BID) | ORAL | 0 refills | Status: AC
Start: 2023-09-07 — End: ?

## 2023-09-07 MED ORDER — SEMAGLUTIDE-WEIGHT MANAGEMENT 2.4 MG/0.75ML ~~LOC~~ SOAJ: 2.4000 mg | SUBCUTANEOUS | 0 refills | Status: AC

## 2023-09-07 MED ORDER — SEMAGLUTIDE-WEIGHT MANAGEMENT 0.5 MG/0.5ML ~~LOC~~ SOAJ: 0.5000 mg | SUBCUTANEOUS | 0 refills | Status: AC

## 2023-09-07 MED ORDER — SEMAGLUTIDE-WEIGHT MANAGEMENT 1.7 MG/0.75ML ~~LOC~~ SOAJ: 1.7000 mg | SUBCUTANEOUS | 0 refills | Status: AC

## 2023-09-07 MED ORDER — FLUCONAZOLE 150 MG PO TABS
150.0000 mg | ORAL_TABLET | Freq: Every day | ORAL | 0 refills | Status: AC
Start: 2023-09-07 — End: ?

## 2023-09-07 NOTE — Progress Notes (Signed)
 Established Patient Office Visit  Subjective   Patient ID: Jo Compton, female    DOB: [birth year]  Age: 58 y.o. MRN: [id number]  Chief Complaint  Patient presents with   Vaginal Itching    Pt states having urine freq, dysuria, odor. Sxs started x1 week, pt states using otc meds    HPI Discussed the use of AI scribe software for clinical note transcription with the patient, who gave verbal consent to proceed.  History of Present Illness Jo Compton is a 58 year old female who presents with increased urinary frequency and vaginal itching.  She has experienced increased urinary frequency for the past week, urinating approximately every hour or two during the day, but can go several hours without urinating at night. No back pain, hematuria, or dysuria. Her urine has a 'fishy type odor'.  She also reports vaginal itching and a fishy odor for the past week. She has a history of yeast infections but notes that this episode is different as it is associated with increased urinary frequency. She has been using an over-the-counter treatment for yeast infections, which she believes is called Aveeno, but it has only made the symptoms 'tolerable'.  She has previously been treated with Diflucan and metronidazole for similar symptoms. She has had a hysterectomy in the past and has previously consulted a urogynecologist for urinary issues. She has resumed Kegel exercises recently.   Patient Active Problem List   Diagnosis Date Noted   Urinary frequency 09/07/2023   Vaginal itching 09/07/2023   Bacterial vaginitis 09/07/2023   Dysuria 09/07/2023   RUQ abdominal pain 08/06/2022   Rectal bleeding 06/25/2022   Rectal pain 06/25/2022   B12 deficiency 06/16/2022   Breast pain, right 08/21/2021   LLQ pain 08/08/2021   Hyperlipidemia 08/08/2021   Lumbar foraminal stenosis 08/08/2021   Recurrent sinus infections 08/08/2021   Acute non-recurrent pansinusitis 05/01/2021   Acute cough 05/01/2021    Chronic constipation 03/19/2021   External hemorrhoid 03/10/2021   Hyperglycemia 10/14/2020   Elevated BP without diagnosis of hypertension 10/14/2020   Seasonal allergies 07/19/2020   Acute otalgia, left 07/19/2020   Morbid obesity (HCC) 07/19/2020   Right lower quadrant abdominal pain 05/20/2020   Gastroesophageal reflux disease 05/20/2020   Hx of adenomatous colonic polyps 08/22/2019   Multinodular goiter 03/09/2019   Leg cramps 11/25/2018   Claudication (HCC) 11/10/2018   Wound infection 01/08/2018   SOB (shortness of breath) 04/15/2017   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/08/2016   Edema, lower extremity 11/08/2016   Menorrhagia 03/20/2015   Depression 12/05/2014   Anemia, iron deficiency 08/03/2013   Anal fissure 01/17/2013   Hemorrhage of rectum and anus 01/17/2013   Obesity (BMI 30-39.9) 12/17/2012   HTN (hypertension) 08/10/2012   Past Medical History:  Diagnosis Date   Anal fissure    Anemia    Anxiety    no meds taken   Arthritis    Depression    Fistula    rectal   Hx of adenomatous colonic polyps 08/22/2019   OAB (overactive bladder)    Rectal bleeding    Past Surgical History:  Procedure Laterality Date   ABDOMINAL HYSTERECTOMY Bilateral 03/20/2015   Procedure: HYSTERECTOMY ABDOMINAL, EXCISION MESENTERIC NODULE, BLADDER FLAP BIOPSY;  Surgeon: Lula Sale, MD;  Location: [facility];  Service: Gynecology;  Laterality: Bilateral;   APPENDECTOMY     BILATERAL SALPINGECTOMY Bilateral 03/20/2015   Procedure: BILATERAL SALPINGECTOMY;  Surgeon: Lula Sale, MD;  Location: [facility];  Service: Gynecology;  Laterality: Bilateral;   COLONOSCOPY     CYSTOSCOPY N/A 03/20/2015   Procedure: CYSTOSCOPY;  Surgeon: Lula Sale, MD;  Location: [facility];  Service: Gynecology;  Laterality: N/A;   LAPAROSCOPY N/A 03/20/2015   Procedure: LAPAROSCOPY DIAGNOSTIC;  Surgeon: Lula Sale, MD;  Location: [facility];  Service: Gynecology;  Laterality: N/A;   Social  History   Tobacco Use   Smoking status: Never   Smokeless tobacco: Never  Vaping Use   Vaping status: Never Used  Substance Use Topics   Alcohol use: Yes    Alcohol/week: 1.0 standard drink of alcohol    Types: 1 Glasses of wine per week   Drug use: No   Social History   Socioeconomic History   Marital status: Married    Spouse name: Not on file   Number of children: 1   Years of education: Not on file   Highest education level: Some college, no degree  Occupational History   Occupation: Tax adviser at [employer]  Tobacco Use   Smoking status: Never   Smokeless tobacco: Never  Vaping Use   Vaping status: Never Used  Substance and Sexual Activity   Alcohol use: Yes    Alcohol/week: 1.0 standard drink of alcohol    Types: 1 Glasses of wine per week   Drug use: No   Sexual activity: Yes    Birth control/protection: None  Other Topics Concern   Not on file  Social History Narrative   ** Merged History Encounter **       Social Drivers of Corporate investment banker Strain: Not on file  Food Insecurity: Not on file  Transportation Needs: Not on file  Physical Activity: Not on file  Stress: Not on file  Social Connections: Unknown (09/09/2021)   Received from [facility]   Social Network    Social Network: Not on file  Intimate Partner Violence: Unknown (07/31/2021)   [facility] from [facility]   HITS    Physically Hurt: Not on file    Insult or Talk Down To: Not on file    Threaten Physical Harm: Not on file    Scream or Curse: Not on file   Family Status  Relation Name Status   Mother  Deceased   Sister  Alive   Mat Uncle  Deceased   Mat Uncle  Deceased   Mat Aunt  (Not Specified)   Neg Hx  (Not Specified)  No partnership data on file   Family History  Problem Relation Age of Onset   Hypertension Mother    COPD Mother    Lung cancer Mother    Colon cancer Maternal Uncle    Liver cancer Maternal Uncle     Hypertension Maternal Aunt    Diabetes Maternal Aunt    Esophageal cancer Neg Hx    Rectal cancer Neg Hx    Stomach cancer Neg Hx    No Known Allergies    Review of Systems  Constitutional:  Negative for fever and malaise/fatigue.  HENT:  Negative for congestion.   Eyes:  Negative for blurred vision.  Respiratory:  Negative for cough and shortness of breath.   Cardiovascular:  Negative for chest pain, palpitations and leg swelling.  Gastrointestinal:  Negative for vomiting.  Genitourinary:  Positive for dysuria and frequency. Negative for flank pain, hematuria and urgency.  Musculoskeletal:  Negative for back pain.  Skin:  Negative for rash.  Neurological:  Negative for  loss of consciousness and headaches.      Objective:     BP (!) 138/90 (BP Location: Left Arm, Patient Position: Sitting, Cuff Size: Large)   Pulse 68   Temp 98.1 F (36.7 C) (Oral)   Resp 18   Ht 5\' 6"  (1.676 m)   Wt 232 lb 12.8 oz (105.6 kg)   LMP 02/14/2015 (Approximate)   SpO2 97%   BMI 37.57 kg/m  BP Readings from Last 3 Encounters:  09/07/23 (!) 138/90  06/04/23 (!) 169/99  04/15/23 126/88   Wt Readings from Last 3 Encounters:  09/07/23 232 lb 12.8 oz (105.6 kg)  06/04/23 239 lb 11.2 oz (108.7 kg)  04/15/23 237 lb 12.8 oz (107.9 kg)   SpO2 Readings from Last 3 Encounters:  09/07/23 97%  06/04/23 98%  04/15/23 94%      Physical Exam Vitals and nursing note reviewed.  Constitutional:      General: She is not in acute distress.    Appearance: Normal appearance. She is well-developed.  HENT:     Head: Normocephalic and atraumatic.  Eyes:     General: No scleral icterus.       Right eye: No discharge.        Left eye: No discharge.  Cardiovascular:     Rate and Rhythm: Normal rate and regular rhythm.     Heart sounds: No murmur heard. Pulmonary:     Effort: Pulmonary effort is normal. No respiratory distress.     Breath sounds: Normal breath sounds.  Genitourinary:    Comments:  Vaginal odor Vaginal itching  Musculoskeletal:        General: Normal range of motion.     Cervical back: Normal range of motion and neck supple.     Right lower leg: No edema.     Left lower leg: No edema.  Skin:    General: Skin is warm and dry.  Neurological:     Mental Status: She is alert and oriented to person, place, and time.  Psychiatric:        Mood and Affect: Mood normal.        Behavior: Behavior normal.        Thought Content: Thought content normal.        Judgment: Judgment normal.      Results for orders placed or performed in visit on 09/07/23  POCT Urinalysis Dipstick (Automated)  Result Value Ref Range   Color, UA yellow    Clarity, UA clear    Glucose, UA Negative Negative   Bilirubin, UA small    Ketones, UA negative    Spec Grav, UA 1.025 1.010 - 1.025   Blood, UA negative    pH, UA 5.0 5.0 - 8.0   Protein, UA Negative Negative   Urobilinogen, UA 0.2 0.2 or 1.0 E.U./dL   Nitrite, UA negative    Leukocytes, UA Negative Negative    Last CBC Lab Results  Component Value Date   WBC 4.6 06/04/2023   HGB 13.9 06/04/2023   HCT 42.9 06/04/2023   MCV 76.7 (L) 06/04/2023   MCH 24.9 (L) 06/04/2023   RDW 14.2 06/04/2023   PLT 269 06/04/2023   Last metabolic panel Lab Results  Component Value Date   GLUCOSE 99 06/04/2023   NA 136 06/04/2023   K 3.8 06/04/2023   CL 103 06/04/2023   CO2 26 06/04/2023   BUN 10 06/04/2023   CREATININE 0.88 06/04/2023   GFRNONAA >60 06/04/2023   CALCIUM   10.1 06/04/2023   PHOS 3.3 11/25/2018   PROT 7.4 06/04/2023   ALBUMIN 4.0 06/04/2023   BILITOT 0.6 06/04/2023   ALKPHOS 59 06/04/2023   AST 23 06/04/2023   ALT 27 06/04/2023   ANIONGAP 7 06/04/2023   Last lipids Lab Results  Component Value Date   CHOL 259 (H) 11/26/2022   HDL 54.40 11/26/2022   LDLCALC 182 (H) 11/26/2022   TRIG 113.0 11/26/2022   CHOLHDL 5 11/26/2022   Last hemoglobin A1c Lab Results  Component Value Date   HGBA1C 6.1 10/14/2020    Last thyroid  functions Lab Results  Component Value Date   TSH 1.03 11/26/2022   Last vitamin D  Lab Results  Component Value Date   VD25OH 23.30 (L) 11/26/2022   Last vitamin B12 and Folate Lab Results  Component Value Date   VITAMINB12 206 (L) 11/26/2022      The 10-year ASCVD risk score (Arnett DK, et al., 2019) is: 8.1%    Assessment & Plan:   Problem List Items Addressed This Visit       Unprioritized   Vaginal itching   Relevant Medications   fluconazole (DIFLUCAN) 150 MG tablet   Other Relevant Orders   POCT Urinalysis Dipstick (Automated) (Completed)   Cervicovaginal ancillary only( [place])   Urinary frequency   Check labs  Urine culture pending       Relevant Orders   Hemoglobin A1c   Morbid obesity (HCC)   Pt would like to pay for wegovy       Relevant Medications   Semaglutide -Weight Management 0.25 MG/0.5ML SOAJ   Semaglutide -Weight Management 0.5 MG/0.5ML SOAJ (Start on 10/06/2023)   Semaglutide -Weight Management 1 MG/0.5ML SOAJ (Start on 11/04/2023)   Semaglutide -Weight Management 1.7 MG/0.75ML SOAJ (Start on 12/03/2023)   Semaglutide -Weight Management 2.4 MG/0.75ML SOAJ (Start on 01/01/2024)   Dysuria - Primary   Relevant Orders   POCT Urinalysis Dipstick (Automated) (Completed)   Urine Culture   CBC with Differential/Platelet   Comprehensive metabolic panel with GFR   Lipid panel   TSH   Bacterial vaginitis   Relevant Medications   metroNIDAZOLE (FLAGYL) 500 MG tablet   fluconazole (DIFLUCAN) 150 MG tablet  Assessment and Plan Assessment & Plan Bacterial vaginosis   Symptoms of itching and a fishy odor suggest bacterial vaginosis. A urine test ruled out a urinary tract infection. Differential diagnosis includes a yeast infection. Awaiting swab results for confirmation. She has responded well to metronidazole in the past. Prescribe metronidazole and advise against alcohol consumption during treatment due to the risk of severe nausea.  Perform a swab test to confirm the diagnosis. Discontinue metronidazole if swab results are negative for bacterial vaginosis.  Yeast infection   Symptoms include itching and discomfort, with limited relief from over-the-counter treatments. Differential diagnosis includes bacterial vaginosis. She has previously responded well to Diflucan. Prescribe Diflucan (fluconazole) with instructions to take one pill and repeat in three days if needed.    No follow-ups on file.    Taj Arteaga R Lowne Chase, DO

## 2023-09-07 NOTE — Assessment & Plan Note (Signed)
 Pt would like to pay for wegovy 

## 2023-09-07 NOTE — Assessment & Plan Note (Signed)
Check labs  Urine culture pending

## 2023-09-08 LAB — URINE CULTURE
MICRO NUMBER:: 16448689
Result:: NO GROWTH
SPECIMEN QUALITY:: ADEQUATE

## 2023-09-08 LAB — CERVICOVAGINAL ANCILLARY ONLY
Bacterial Vaginitis (gardnerella): NEGATIVE
Candida Glabrata: NEGATIVE
Candida Vaginitis: NEGATIVE
Chlamydia: NEGATIVE
Comment: NEGATIVE
Comment: NEGATIVE
Comment: NEGATIVE
Comment: NEGATIVE
Comment: NEGATIVE
Comment: NORMAL
Neisseria Gonorrhea: NEGATIVE
Trichomonas: NEGATIVE

## 2023-09-10 ENCOUNTER — Ambulatory Visit: Payer: Self-pay | Admitting: Family Medicine

## 2023-10-26 ENCOUNTER — Telehealth: Payer: Self-pay | Admitting: Gastroenterology

## 2023-10-26 DIAGNOSIS — K625 Hemorrhage of anus and rectum: Secondary | ICD-10-CM

## 2023-10-26 NOTE — Telephone Encounter (Addendum)
 Patient requesting medication refill for ditiazam [facility]. Please advise.   Patient scheduled for ov with pa 7/15  States she is experiencing burning and irritation due to constipation.

## 2023-10-26 NOTE — Telephone Encounter (Signed)
 Dr Avram ok to refill until appt?

## 2023-10-26 NOTE — Telephone Encounter (Signed)
 Please advise. Patient has not been since in over a year. She scheduled an appointment for follow up.

## 2023-10-26 NOTE — Telephone Encounter (Signed)
 It is okay to provide 1 refill of diltiazem  2% / 5% lidocaine  cream apply 2-3 times a day I into the anal canal  Number 30 g

## 2023-10-27 ENCOUNTER — Telehealth: Payer: Self-pay | Admitting: Internal Medicine

## 2023-10-27 ENCOUNTER — Other Ambulatory Visit: Payer: Self-pay

## 2023-10-27 MED ORDER — DILTIAZEM GEL 2 %: CUTANEOUS | 3 refills | Status: DC

## 2023-10-27 NOTE — Telephone Encounter (Signed)
 The pt has been advised of the prescription- faxed to [facility]

## 2023-10-27 NOTE — Telephone Encounter (Signed)
 Patient called and stated that [facility] does not have any prescription for her and is requesting a call back from the nurse. Please advise.

## 2023-10-27 NOTE — Telephone Encounter (Signed)
 Patient calling to advise on previous note. Please advise. States she is headed to [place] and would like to pick the prescription up.

## 2023-10-27 NOTE — Telephone Encounter (Signed)
 Called and spoke with the pharmacy and gave a verbal order for prescription. Pt aware

## 2023-11-09 ENCOUNTER — Ambulatory Visit: Admitting: Gastroenterology

## 2023-12-16 ENCOUNTER — Ambulatory Visit: Admitting: Gastroenterology

## 2023-12-21 ENCOUNTER — Ambulatory Visit: Payer: Self-pay

## 2023-12-21 ENCOUNTER — Emergency Department (HOSPITAL_BASED_OUTPATIENT_CLINIC_OR_DEPARTMENT_OTHER)

## 2023-12-21 ENCOUNTER — Other Ambulatory Visit: Payer: Self-pay

## 2023-12-21 ENCOUNTER — Emergency Department (HOSPITAL_BASED_OUTPATIENT_CLINIC_OR_DEPARTMENT_OTHER)
Admission: EM | Admit: 2023-12-21 | Discharge: 2023-12-21 | Disposition: A | Discharge status: Home / Self Care | Attending: Emergency Medicine | Admitting: Emergency Medicine

## 2023-12-21 ENCOUNTER — Encounter (HOSPITAL_BASED_OUTPATIENT_CLINIC_OR_DEPARTMENT_OTHER): Payer: Self-pay

## 2023-12-21 DIAGNOSIS — R531 Weakness: Secondary | ICD-10-CM | POA: Diagnosis not present

## 2023-12-21 DIAGNOSIS — R519 Headache, unspecified: Secondary | ICD-10-CM | POA: Diagnosis present

## 2023-12-21 DIAGNOSIS — R202 Paresthesia of skin: Secondary | ICD-10-CM | POA: Diagnosis not present

## 2023-12-21 LAB — COMPREHENSIVE METABOLIC PANEL WITH GFR
ALT: 33 U/L (ref 0–44)
AST: 29 U/L (ref 15–41)
Albumin: 4.1 g/dL (ref 3.5–5.0)
Alkaline Phosphatase: 80 U/L (ref 38–126)
Anion gap: 11 (ref 5–15)
BUN: 12 mg/dL (ref 6–20)
CO2: 21 mmol/L — ABNORMAL LOW (ref 22–32)
Calcium: 9.5 mg/dL (ref 8.9–10.3)
Chloride: 104 mmol/L (ref 98–111)
Creatinine, Ser: 0.76 mg/dL (ref 0.44–1.00)
GFR, Estimated: >60 mL/min (ref >60)
Glucose, Bld: 103 mg/dL — ABNORMAL HIGH (ref 70–99)
Potassium: 4.1 mmol/L (ref 3.5–5.1)
Sodium: 137 mmol/L (ref 135–145)
Total Bilirubin: 0.4 mg/dL (ref 0.0–1.2)
Total Protein: 6.8 g/dL (ref 6.5–8.1)

## 2023-12-21 LAB — CBC
HCT: 45.1 % (ref 36.0–46.0)
Hemoglobin: 14.5 g/dL (ref 12.0–15.0)
MCH: 24.6 pg — ABNORMAL LOW (ref 26.0–34.0)
MCHC: 32.2 g/dL (ref 30.0–36.0)
MCV: 76.6 fL — ABNORMAL LOW (ref 80.0–100.0)
Platelets: 216 K/uL (ref 150–400)
RBC: 5.89 MIL/uL — ABNORMAL HIGH (ref 3.87–5.11)
RDW: 16.3 % — ABNORMAL HIGH (ref 11.5–15.5)
WBC: 5.5 K/uL (ref 4.0–10.5)
nRBC: 0 % (ref 0.0–0.2)

## 2023-12-21 LAB — DIFFERENTIAL
Abs Immature Granulocytes: 0.01 K/uL (ref 0.00–0.07)
Basophils Absolute: 0 K/uL (ref 0.0–0.1)
Basophils Relative: 0 %
Eosinophils Absolute: 0.1 K/uL (ref 0.0–0.5)
Eosinophils Relative: 3 %
Immature Granulocytes: 0 %
Lymphocytes Relative: 41 %
Lymphs Abs: 2.3 K/uL (ref 0.7–4.0)
Monocytes Absolute: 0.5 K/uL (ref 0.1–1.0)
Monocytes Relative: 9 %
Neutro Abs: 2.6 K/uL (ref 1.7–7.7)
Neutrophils Relative %: 47 %

## 2023-12-21 LAB — PROTIME-INR
INR: 0.9 (ref 0.8–1.2)
Prothrombin Time: 12.7 s (ref 11.4–15.2)

## 2023-12-21 LAB — CBG MONITORING, ED: Glucose-Capillary: 106 mg/dL — ABNORMAL HIGH (ref 70–99)

## 2023-12-21 LAB — APTT: aPTT: 22 s — ABNORMAL LOW (ref 24–36)

## 2023-12-21 MED ORDER — SODIUM CHLORIDE 0.9% FLUSH
3.0000 mL | Freq: Once | INTRAVENOUS | Status: DC
  Filled 2023-12-21: qty 3

## 2023-12-21 MED ORDER — DEXAMETHASONE SODIUM PHOSPHATE 10 MG/ML IJ SOLN
10.0000 mg | Freq: Once | INTRAMUSCULAR | Status: AC
  Administered 2023-12-21: 10 mg via INTRAVENOUS
  Filled 2023-12-21: qty 1

## 2023-12-21 MED ORDER — KETOROLAC TROMETHAMINE 30 MG/ML IJ SOLN
30.0000 mg | Freq: Once | INTRAMUSCULAR | Status: AC
  Administered 2023-12-21: 30 mg via INTRAVENOUS
  Filled 2023-12-21: qty 1

## 2023-12-21 NOTE — ED Provider Notes (Signed)
 This patient was seen in MSE fashion by myself.  Patient presenting to the emergency department from home POV due to right sided weakness and tingling for approximately 1 week.  Patient also notes headache for approximately 1 week.  States that headache is sharp and that symptoms have become more frequent and severe as well along with headache.  Denies visual changes.  Based on chart review looks like patient was seen for similar symptoms back in February, ultimately had an MRI which showed cervical stenosis, at this time patient also had documented numbness/tingling.  Patient states that she followed up with neurosurgery but did not want to go through with any surgery at this time so she has not seen them since.  States that symptoms had been better until recently.  On physical exam no facial droop, no slurred speech, no unilateral weakness, sensation comparable, patient states that symptoms are episodic and right now she is not experiencing one of the episodes.  However she does still appreciate headache.  Also appreciates some congestion, throat drainage as well as sinus tenderness.  Based off initial physical exam CBC, CMP, PT/INR, APTT, CT head, EKG ordered.  All labs pending at this time, Dr. Cottie will see patient and determine further work-up and disposition.   Code stroke not activated as patient states that she has been experiencing symptoms for approximately 1 week and that they are episodic, at time of my initial exam no facial droop, no slurred speech, no visual disturbances, no pronator drift, no unilateral weakness, no reduced sensation, patient ambulatory without assistance.   Janetta Terrall FALCON, PA-C 12/21/23 [id number]    Cottie Donnice PARAS, MD 12/23/23 [phone]

## 2023-12-21 NOTE — ED Triage Notes (Signed)
 Patient here POV from Home.  Endorse Right Sided weakness and tingling for approximately 1 week. Also notes headache for approximately 1 week. Headache is sharp and symptoms have become more frequent and severe as well.   Some nausea. No vision changes.   NAD Noted during Triage. A&Ox4. GCS 15. Ambulatory.

## 2023-12-21 NOTE — ED Provider Notes (Signed)
 [place] [facility]ERGENCY DEPARTMENT AT [facility] Provider Note   CSN: [id number] Arrival date & time: 12/21/23  1152     Patient presents with: Weakness   Jo RACZKOWSKI is a 58 y.o. female presenting to ED complaining of a primarily frontal headache ongoing for 1 week.  She says the headache is waxing and waning, not positional.  It is currently moderate intensity.  She says she feels a pressure in her sinuses and occasional sharp pain near her sinuses.  She reports she has had intermittent weakness and paresthesias in her right arm.  This been ongoing for some time.  She was worked up for this back in February, which time she had an MRI of her brain and cervical spine performed as well as a CT angiogram.  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  She was referred to a spine or neurosurgery clinic but declined options for surgery.  Her CT angiogram per my review showed no high-grade stenosis or evidence of aneurysm.   HPI     Prior to Admission medications   Medication Sig Start Date End Date Taking? Authorizing Provider  amoxicillin -clavulanate (AUGMENTIN ) 875-125 MG tablet Take 1 tablet by mouth 2 (two) times daily. Patient not taking: Reported on 06/04/2023 04/15/23   Antonio Meth, [name], DO  azelastine  (ASTELIN ) 0.1 % nasal spray Place 1 spray into both nostrils 2 (two) times daily. Use in each nostril as directed 04/15/23   Antonio Meth, [name], DO  diltiazem  2 % GEL With lidocaine  5% - apply 2-3 times daily 10/27/23   Avram Lupita BRAVO, MD  fluconazole  (DIFLUCAN ) 150 MG tablet Take 1 tablet (150 mg total) by mouth daily. May repeat in 3 days if needed. 09/07/23   Lowne Chase, Yvonne R, DO  hydrochlorothiazide  (MICROZIDE ) 12.5 MG capsule TAKE 1 CAPSULE BY MOUTH EVERY DAY Patient taking differently: Take 12.5 mg by mouth every evening. 05/31/23   Lowne Chase, Yvonne R, DO  hydrocortisone  (ANUSOL -HC) 2.5 % rectal cream Place 1 Application rectally  daily as needed for hemorrhoids or anal itching. Patient not taking: Reported on 06/04/2023 06/15/22   Craig Alan SAUNDERS, PA-C  ibuprofen  (ADVIL ) 800 MG tablet Take 1 tablet (800 mg total) by mouth every 8 (eight) hours as needed. Patient not taking: Reported on 06/04/2023 11/25/18   Antonio Meth, [name], DO  loratadine  (CLARITIN ) 10 MG tablet Take 1 tablet (10 mg total) by mouth daily. 11/26/22   Antonio Meth [name], DO  metroNIDAZOLE  (FLAGYL ) 500 MG tablet Take 1 tablet (500 mg total) by mouth 2 (two) times daily. 09/07/23   Antonio Meth [name], DO  ofloxacin  (FLOXIN  OTIC) 0.3 % OTIC solution Place 5 drops into the left ear daily. Patient not taking: Reported on 06/04/2023 07/10/20   Antonio Meth [name], DO  omeprazole  (PRILOSEC ) 20 MG capsule Take 1 capsule (20 mg total) by mouth daily. 04/09/22   Antonio Meth Jamee R, DO  pantoprazole  (PROTONIX ) 20 MG tablet TAKE 1 TABLET BY MOUTH EVERY DAY 02/22/23   Antonio Meth, Jamee R, DO  rosuvastatin  (CRESTOR ) 20 MG tablet TAKE 1 TABLET BY MOUTH EVERY DAY 03/01/23   Antonio Meth, Yvonne R, DO  Semaglutide -Weight Management 1.7 MG/0.75ML SOAJ Inject 1.7 mg into the skin once a week for 28 days. 12/03/23 12/31/23  Antonio Meth [name], DO  Semaglutide -Weight Management 2.4 MG/0.75ML SOAJ Inject 2.4 mg into the skin once a week for 28 days. 01/01/24 01/29/24  Lowne Chase, Yvonne R, DO  SYRINGE-NEEDLE, DISP, 3 ML (LUER LOCK SAFETY SYRINGES) 25G X 1 3 ML MISC Use to inject B12 weekly for 4 weeks and then monthly Patient not taking: Reported on 04/15/2023 04/15/22   Antonio Cyndee Jamee JONELLE, DO  triamcinolone  (NASACORT ) 55 MCG/ACT AERO nasal inhaler Place 2 sprays into the nose daily. Patient not taking: Reported on 06/04/2023 04/15/23   Lowne Chase, Yvonne R, DO    Allergies: Patient has no known allergies.    Review of Systems  Updated Vital Signs BP 129/85   Pulse 72   Temp 97.8 F (36.6 C) (Oral)   Resp 20   Ht 5' 7 (1.702 m)   Wt 104.3 kg   LMP 02/14/2015  (Approximate)   SpO2 100%   BMI 36.02 kg/m   Physical Exam Constitutional:      General: She is not in acute distress. HENT:     Head: Normocephalic and atraumatic.  Eyes:     Conjunctiva/sclera: Conjunctivae normal.     Pupils: Pupils are equal, round, and reactive to light.  Cardiovascular:     Rate and Rhythm: Normal rate and regular rhythm.  Pulmonary:     Effort: Pulmonary effort is normal. No respiratory distress.  Abdominal:     General: There is no distension.     Tenderness: There is no abdominal tenderness.  Skin:    General: Skin is warm and dry.  Neurological:     General: No focal deficit present.     Mental Status: She is alert and oriented to person, place, and time. Mental status is at baseline.     Cranial Nerves: No cranial nerve deficit.     Sensory: No sensory deficit.     Motor: No weakness.     Coordination: Coordination normal.  Psychiatric:        Mood and Affect: Mood normal.        Behavior: Behavior normal.     (all labs ordered are listed, but only abnormal results are displayed) Labs Reviewed  APTT - Abnormal; Notable for the following components:      Result Value   aPTT 22 (*)    All other components within normal limits  CBC - Abnormal; Notable for the following components:   RBC 5.89 (*)    MCV 76.6 (*)    MCH 24.6 (*)    RDW 16.3 (*)    All other components within normal limits  COMPREHENSIVE METABOLIC PANEL WITH GFR - Abnormal; Notable for the following components:   CO2 21 (*)    Glucose, Bld 103 (*)    All other components within normal limits  CBG MONITORING, ED - Abnormal; Notable for the following components:   Glucose-Capillary 106 (*)    All other components within normal limits  PROTIME-INR  DIFFERENTIAL  ETHANOL    EKG: EKG Interpretation Date/Time:  Tuesday December 21 2023 12:06:52 EDT Ventricular Rate:  67 PR Interval:  176 QRS Duration:  79 QT Interval:  401 QTC Calculation: 424 R Axis:   -17  Text  Interpretation: Sinus rhythm Low voltage, precordial leads Abnormal R-wave progression, early transition Le Confirmed by Cottie Cough [phone]) on 12/21/2023 12:24:41 PM  Radiology: CT HEAD WO CONTRAST Result Date: 12/21/2023 EXAM: CT HEAD WITHOUT CONTRAST 12/21/2023 12:52:47 PM TECHNIQUE: CT of the head was performed without the administration of intravenous contrast. Automated exposure control, iterative reconstruction, and/or weight based adjustment of the mA/kV was utilized to reduce the radiation dose to  as low as reasonably achievable. COMPARISON: Head MRI and CTA 06/04/2023. CLINICAL HISTORY: Neuro deficit, acute, stroke suspected. FINDINGS: BRAIN AND VENTRICLES: No acute hemorrhage. Gray-white differentiation is preserved. No hydrocephalus. No extra-axial collection. No mass effect or midline shift. ORBITS: No acute abnormality. SINUSES: No acute abnormality. SOFT TISSUES AND SKULL: No acute soft tissue abnormality. No skull fracture. IMPRESSION: 1. Negative head CT. Electronically signed by: Dasie Hamburg MD 12/21/2023 01:04 PM EDT RP Workstation: [id number]     Procedures   Medications Ordered in the ED  sodium chloride  flush (NS) 0.9 % injection 3 mL (has no administration in time range)  dexamethasone  (DECADRON ) injection 10 mg (10 mg Intravenous Given 12/21/23 1258)  ketorolac  (TORADOL ) 30 MG/ML injection 30 mg (30 mg Intravenous Given 12/21/23 1343)                                    Medical Decision Making Amount and/or Complexity of Data Reviewed Labs: ordered. Radiology: ordered.  Risk Prescription drug management.   This patient presents to the ED with concern for frontal headache, paresthesia. This involves an extensive number of treatment options, and is a complaint that carries with it a high risk of complications and morbidity.  The differential diagnosis includes tension type headache versus sinus infection versus other  NIHSS of 0 on arrival - doubt acute  CVA  Right arm paresthesias and intermittent weakness are mostly localized to likely cervical stenosis noted on prior MRI.  I have a low suspicion for stroke, multiple sclerosis or demyelinating disease.  External records from outside source obtained and reviewed including prior MRI of the brain and cervical spine as well as CT angio of the head and neck from February of this year.  No evidence of high-grade arterial stenosis, vascular injury, or aneurysm on CT angiogram earlier this year.  This lowers my suspicion for [facility] or vascular injury and I do not believe she needs repeat imaging at this time.  I ordered and personally interpreted labs.  The pertinent results include: No emergent findings  I ordered imaging studies including CT scan of the head I independently visualized and interpreted imaging which showed no emergent findings I agree with the radiologist interpretation  The patient was maintained on a cardiac monitor.  I personally viewed and interpreted the cardiac monitored which showed an underlying rhythm of: Sinus rhythm  Per my interpretation the patient's ECG shows sinus rhythm no acute ischemic findings  I ordered medication including IV decadron  for headache  I have reviewed the patients home medicines and have made adjustments as needed  Test Considered: No indication for repeat CT angiogram or LP.  Doubt subarachnoid hemorrhage or meningitis or life-threatening cause of headache.  Patient's motor strength is intact and normal in her right upper extremity at this time.   Disposition:  After consideration of the diagnostic results and the patient's response to treatment, I feel that the patient would benefit from close outpatient follow-up      Final diagnoses:  Sinus headache    ED Discharge Orders     None          Artesha Wemhoff, Donnice PARAS, MD 12/21/23 1439

## 2023-12-21 NOTE — Telephone Encounter (Signed)
 Pt headed to ER now to be evaluated.  She if the wait is long she will leave.  Advised that she stay due to her symptoms being warranted as urgent and needs attention now.

## 2023-12-21 NOTE — Telephone Encounter (Signed)
 FYI Only or Action Required?: Action required by provider: Refusing hospital, plans on ED in am or earlier if worsening, please call pt back with further recommendations.  Patient was last seen in primary care on 09/07/2023 by Antonio Meth, [name], DO.  Called Nurse Triage reporting Headache, burning sensation in nose, right-sided weakness, Ear Pain, fogginess, Sore Throat, Eye Pain, and nose pain.  Symptoms began several weeks ago.  Interventions attempted: Rest, hydration, or home remedies.  Symptoms are: severe and sporadic.  Triage Disposition: Call EMS 911 Now  Patient/caregiver understands and will follow disposition?: No, refuses disposition      Message from Edson W sent at 12/21/2023 11:05 AM EDT  Headaches + Burning Sensation In Nose   Reason for Disposition  [1] Weakness of the face, arm or leg on one side of the body AND [2] new-onset  Answer Assessment - Initial Assessment Questions 1. LOCATION: Where does it hurt?      Think another sinus infection, not cheeks, just from nose up middle part of head 2. ONSET: When did the headache start? (e.g., minutes, hours, days)      About a week 3. PATTERN: Does the pain come and go, or has it been constant since it started?     Comes and goes but starting to come and go more often than just regular headache 4. SEVERITY: How bad is the pain? and What does it keep you from doing?  (e.g., Scale 1-10; mild, moderate, or severe)     Sometimes more sharp 6-7/10, sometimes sharper than other times 5. RECURRENT SYMPTOM: Have you ever had headaches before? If Yes, ask: When was the last time? and What happened that time?      Only when have sinus infection headaches like these 6. CAUSE: What do you think is causing the headache?     Think another sinus infection 8. HEAD INJURY: Has there been any recent injury to your head?      no 9. OTHER SYMPTOMS: Do you have any other symptoms? (e.g., fever, stiff  neck, eye pain, sore throat, cold symptoms)     No fever, not really congested nasally, no cheek pain, not really eye pain or sore throat, can say yes to sore throat little bit, eye pain some but not really, mostly pain in nose going up towards head and ear pain both ears  Now that mention it some to right side, weakness, right arm not face, no more than usual, have sciatic nerve issue, everything on leg be hurting sometimes but to me different situation Sometimes just feels limber, kind of limp, been going on about 2 weeks, didn't think much about it because know leg having nerve issue so thought nerve going through hand in arm No numbness or tingling No changes to speech As far as confusion, nothing that really stands out, do have episodes of fogginess but always know where I'm out, but do be feeling like wait a minute what but getting older, well yeah coming more in past week or 2, but took it as getting old and be forgetting stuff like most older people do Don't know what all is going on but know head be hurting, ears be hurting, nose be hurting, and do be having little bit of weakness on right side where just feels like blah but thought from nerve issue have in my back Feel like in right state of mind right now Going to work and everything but just noticed these things happening in  last couple days or so, think will maybe go in the morning or so, don't want to sit there at this time of day, will go in morning will go to [facility] on [address] but if it gets worse then I'll go on, but right now don't think urgent thing Advised call 911 if weakness or fogginess return, or call 911 if losing consciousness, new numbness/tingling/speech changes, severe headache/eye pain or vision changes get to hospital, have another adult drive to hospital if not 088    Advised pt go to hospital right away, especially calling 911 if present weakness or fogginess, pt refusing at this time, pt states she will go to ED in morning  or earlier if worsening. Advised 911 if worsening or new symptoms (discussed), sending message to PCP for call back to pt if further recommendations/questions.  Protocols used: [facility]

## 2023-12-21 NOTE — ED Provider Notes (Incomplete)
 [place] EMERGENCY DEPARTMENT AT [facility] Provider Note   CSN: [id number] Arrival date & time: 12/21/23  1152     Patient presents with: Weakness   Jo Compton is a 58 y.o. female.  {Add pertinent medical, surgical, social history, OB history to HPI:32947}  Weakness      Prior to Admission medications   Medication Sig Start Date End Date Taking? Authorizing Provider  amoxicillin -clavulanate (AUGMENTIN ) 875-125 MG tablet Take 1 tablet by mouth 2 (two) times daily. Patient not taking: Reported on 06/04/2023 04/15/23   Antonio Cyndee Rockers R, DO  azelastine  (ASTELIN ) 0.1 % nasal spray Place 1 spray into both nostrils 2 (two) times daily. Use in each nostril as directed 04/15/23   Antonio Cyndee, Rockers SAUNDERS, DO  diltiazem  2 % GEL With lidocaine  5% - apply 2-3 times daily 10/27/23   Avram Lupita BRAVO, MD  fluconazole  (DIFLUCAN ) 150 MG tablet Take 1 tablet (150 mg total) by mouth daily. May repeat in 3 days if needed. 09/07/23   Lowne Chase, Yvonne R, DO  hydrochlorothiazide  (MICROZIDE ) 12.5 MG capsule TAKE 1 CAPSULE BY MOUTH EVERY DAY Patient taking differently: Take 12.5 mg by mouth every evening. 05/31/23   Lowne Chase, Yvonne R, DO  hydrocortisone  (ANUSOL -HC) 2.5 % rectal cream Place 1 Application rectally daily as needed for hemorrhoids or anal itching. Patient not taking: Reported on 06/04/2023 06/15/22   Craig Alan SAUNDERS, PA-C  ibuprofen  (ADVIL ) 800 MG tablet Take 1 tablet (800 mg total) by mouth every 8 (eight) hours as needed. Patient not taking: Reported on 06/04/2023 11/25/18   Antonio Cyndee, Rockers SAUNDERS, DO  loratadine  (CLARITIN ) 10 MG tablet Take 1 tablet (10 mg total) by mouth daily. 11/26/22   Antonio Cyndee Rockers R, DO  metroNIDAZOLE  (FLAGYL ) 500 MG tablet Take 1 tablet (500 mg total) by mouth 2 (two) times daily. 09/07/23   Antonio Cyndee Rockers SAUNDERS, DO  ofloxacin  (FLOXIN  OTIC) 0.3 % OTIC solution Place 5 drops into the left ear daily. Patient not taking: Reported on 06/04/2023 07/10/20    Antonio Cyndee, Rockers SAUNDERS, DO  omeprazole  (PRILOSEC ) 20 MG capsule Take 1 capsule (20 mg total) by mouth daily. 04/09/22   Antonio Cyndee Rockers R, DO  pantoprazole  (PROTONIX ) 20 MG tablet TAKE 1 TABLET BY MOUTH EVERY DAY 02/22/23   Antonio Cyndee, Yvonne R, DO  rosuvastatin  (CRESTOR ) 20 MG tablet TAKE 1 TABLET BY MOUTH EVERY DAY 03/01/23   Antonio Cyndee, Rockers SAUNDERS, DO  Semaglutide -Weight Management 1.7 MG/0.75ML SOAJ Inject 1.7 mg into the skin once a week for 28 days. 12/03/23 12/31/23  Antonio Cyndee Rockers SAUNDERS, DO  Semaglutide -Weight Management 2.4 MG/0.75ML SOAJ Inject 2.4 mg into the skin once a week for 28 days. 01/01/24 01/29/24  Antonio Cyndee Rockers R, DO  SYRINGE-NEEDLE, DISP, 3 ML (LUER LOCK SAFETY SYRINGES) 25G X 1 3 ML MISC Use to inject B12 weekly for 4 weeks and then monthly Patient not taking: Reported on 04/15/2023 04/15/22   Antonio Cyndee Rockers SAUNDERS, DO  triamcinolone  (NASACORT ) 55 MCG/ACT AERO nasal inhaler Place 2 sprays into the nose daily. Patient not taking: Reported on 06/04/2023 04/15/23   Lowne Chase, Yvonne R, DO    Allergies: Patient has no known allergies.    Review of Systems  Neurological:  Positive for weakness.    Updated Vital Signs BP (!) 145/95 (BP Location: Right Arm)   Pulse 72   Temp 97.8 F (36.6 C) (Oral)   Resp 18   Ht 5' 7 (  1.702 m)   Wt 104.3 kg   LMP 02/14/2015 (Approximate)   SpO2 100%   BMI 36.02 kg/m   Physical Exam  (all labs ordered are listed, but only abnormal results are displayed) Labs Reviewed  PROTIME-INR  APTT  CBC  DIFFERENTIAL  COMPREHENSIVE METABOLIC PANEL WITH GFR  ETHANOL  CBG MONITORING, ED    EKG: None  Radiology: No results found.  {Document cardiac monitor, telemetry assessment procedure when appropriate:32947} Procedures   Medications Ordered in the ED  sodium chloride  flush (NS) 0.9 % injection 3 mL (has no administration in time range)      {Click here for ABCD2, HEART and other calculators REFRESH Note before  signing:1}                              Medical Decision Making Amount and/or Complexity of Data Reviewed Labs: ordered. Radiology: ordered.  Risk Prescription drug management.   ***  {Document critical care time when appropriate  Document review of labs and clinical decision tools ie [id number], etc  Document your independent review of radiology images and any outside records  Document your discussion with family members, caretakers and with consultants  Document social determinants of health affecting pt's care  Document your decision making why or why not admission, treatments were needed:32947:::1}   Final diagnoses:  None    ED Discharge Orders     None

## 2023-12-24 ENCOUNTER — Telehealth: Payer: Self-pay

## 2023-12-24 ENCOUNTER — Other Ambulatory Visit: Payer: Self-pay | Admitting: Family Medicine

## 2023-12-24 DIAGNOSIS — R42 Dizziness and giddiness: Secondary | ICD-10-CM

## 2023-12-24 MED ORDER — MECLIZINE HCL 25 MG PO TABS: 25.0000 mg | ORAL_TABLET | Freq: Three times a day (TID) | ORAL | 0 refills | Status: AC | PRN

## 2023-12-24 NOTE — Telephone Encounter (Signed)
 Copied from CRM [id number]. Topic: Clinical - Lab/Test Results >> Dec 24, 2023  8:14 AM Robinson H wrote: Reason for CRM: Patient states she had labs done at the ER that have comeback abnormal and would like to know if Jo Compton or her nurse can look into them and give her a call.  Jahne [phone]

## 2023-12-24 NOTE — Telephone Encounter (Signed)
 Pt called in to follow up on advice for dizziness. She is requesting maybe a medication to help make it through the weekend and if she is still feeling bad would like to come in office next week to see provider. Please call to advise

## 2024-02-04 ENCOUNTER — Ambulatory Visit: Admitting: Gastroenterology

## 2024-05-30 ENCOUNTER — Telehealth: Payer: Self-pay | Admitting: Gastroenterology

## 2024-05-30 NOTE — Telephone Encounter (Signed)
 Pt called back to confirm appt for February the 5 th at 11 am. Please advise.

## 2024-05-30 NOTE — Telephone Encounter (Signed)
 Incoming call from pt regarding scheduling appt. Pt requesting sooner date. Pt scheduled for 06/21/2024. Please advise. Thank you.

## 2024-05-30 NOTE — Telephone Encounter (Signed)
 Left message for the patient. Scheduled her for 06/01/24. Asked she call us  and confirm the appointment.

## 2024-05-31 NOTE — Progress Notes (Unsigned)
 "  Chief Complaint: Anal fissure  HPI:    Jo Compton is a 59 year old female with a past medical history as listed below, including anxiety and depression, known to Dr. Avram, who was referred to me by Jo Compton, Jo SAUNDERS, DO for a complaint of anal fissure.      08/15/2019 colonoscopy done for rectal bleeding with 2 diminutive polyps in the transverse colon, two 1-2 mm polyps in the descending colon and cecum, eroded linear pattern friable with spontaneous bleeding for furrowed mucosa in the rectum.  At that time discussed that it could be ulcerative proctitis versus prep effect versus relation to constipation.  Biopsy showed 4 diminutive adenomas.  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  Colon recall placed for 2026.    10/26/2023 patient given a refill of diltiazem  for fissure.  Discussed the use of AI scribe software for clinical note transcription with the patient, who gave verbal consent to proceed.  History of Present Illness Jo Compton is a 59 year old AA female with chronic anal fissure, constipation, and prior colonic adenomas who presents for evaluation of persistent anal fissure symptoms.  Anal fissure symptoms have been recurrent since first identified during colonoscopy in 2021. Intermittent topical therapy with diltiazem  and lidocaine  ointment has provided relief, but symptoms recur once or twice per month. She is currently out of medication and has not used it recently. During symptomatic episodes, she experiences intermittent pain, discomfort, itching, and burning in the anal area, particularly with bowel movements and straining. When she does use her Diltiazem  and then she stops it.  Anal sex has also exacerbated symptoms in the past though this is no longer an occurrence for her.  Constipation is chronic, with bowel movements occurring as infrequently as once per week, though sometimes every three days. Difficulty with evacuation and passing hard stools exacerbate fissure symptoms.  She prefers prune juice for constipation management and has discontinued Metamucil due to taste fatigue. She has not tried fiber gummies and is reluctant to use traditional laxatives or fiber supplements. Her last bowel movement was approximately three days ago and was described as satisfactory. No abdominal pain reported.  She is aware of her prior colonic adenomas and the need for repeat colonoscopy.  Denies fever, chills or weight loss.      Past Medical History:  Diagnosis Date   Anal fissure    Anemia    Anxiety    no meds taken   Arthritis    Depression    Fistula    rectal   Hx of adenomatous colonic polyps 08/22/2019   OAB (overactive bladder)    Rectal bleeding     Past Surgical History:  Procedure Laterality Date   ABDOMINAL HYSTERECTOMY Bilateral 03/20/2015   Procedure: HYSTERECTOMY ABDOMINAL, EXCISION MESENTERIC NODULE, BLADDER FLAP BIOPSY;  Surgeon: Rome Rigg, MD;  Location: [facility];  Service: Gynecology;  Laterality: Bilateral;   APPENDECTOMY     BILATERAL SALPINGECTOMY Bilateral 03/20/2015   Procedure: BILATERAL SALPINGECTOMY;  Surgeon: Rome Rigg, MD;  Location: [facility];  Service: Gynecology;  Laterality: Bilateral;   COLONOSCOPY     CYSTOSCOPY N/A 03/20/2015   Procedure: CYSTOSCOPY;  Surgeon: Rome Rigg, MD;  Location: [facility];  Service: Gynecology;  Laterality: N/A;   LAPAROSCOPY N/A 03/20/2015   Procedure: LAPAROSCOPY DIAGNOSTIC;  Surgeon: Rome Rigg, MD;  Location: [facility];  Service: Gynecology;  Laterality: N/A;    Current Outpatient Medications  Medication Sig Dispense Refill   amoxicillin -clavulanate (AUGMENTIN ) 875-125 MG tablet Take 1  tablet by mouth 2 (two) times daily. (Patient not taking: Reported on 06/04/2023) 20 tablet 0   azelastine  (ASTELIN ) 0.1 % nasal spray Place 1 spray into both nostrils 2 (two) times daily. Use in each nostril as directed 30 mL 12   diltiazem  2 % GEL With lidocaine  5% - apply 2-3 times daily 30 g 3    fluconazole  (DIFLUCAN ) 150 MG tablet Take 1 tablet (150 mg total) by mouth daily. May repeat in 3 days if needed. 2 tablet 0   hydrochlorothiazide  (MICROZIDE ) 12.5 MG capsule TAKE 1 CAPSULE BY MOUTH EVERY DAY (Patient taking differently: Take 12.5 mg by mouth every evening.) 30 capsule 11   hydrocortisone  (ANUSOL -HC) 2.5 % rectal cream Place 1 Application rectally daily as needed for hemorrhoids or anal itching. (Patient not taking: Reported on 06/04/2023) 30 g 0   ibuprofen  (ADVIL ) 800 MG tablet Take 1 tablet (800 mg total) by mouth every 8 (eight) hours as needed. (Patient not taking: Reported on 06/04/2023) 30 tablet 0   loratadine  (CLARITIN ) 10 MG tablet Take 1 tablet (10 mg total) by mouth daily. 90 tablet 3   meclizine  (MEDI-MECLIZINE ) 25 MG tablet Take 1 tablet (25 mg total) by mouth 3 (three) times daily as needed for dizziness. 30 tablet 0   metroNIDAZOLE  (FLAGYL ) 500 MG tablet Take 1 tablet (500 mg total) by mouth 2 (two) times daily. 14 tablet 0   ofloxacin  (FLOXIN  OTIC) 0.3 % OTIC solution Place 5 drops into the left ear daily. (Patient not taking: Reported on 06/04/2023) 5 mL 0   omeprazole  (PRILOSEC ) 20 MG capsule Take 1 capsule (20 mg total) by mouth daily. 30 capsule 3   pantoprazole  (PROTONIX ) 20 MG tablet TAKE 1 TABLET BY MOUTH EVERY DAY 90 tablet 1   rosuvastatin  (CRESTOR ) 20 MG tablet TAKE 1 TABLET BY MOUTH EVERY DAY 90 tablet 0   SYRINGE-NEEDLE, DISP, 3 ML (LUER LOCK SAFETY SYRINGES) 25G X 1 3 ML MISC Use to inject B12 weekly for 4 weeks and then monthly (Patient not taking: Reported on 04/15/2023) 50 each 1   triamcinolone  (NASACORT ) 55 MCG/ACT AERO nasal inhaler Place 2 sprays into the nose daily. (Patient not taking: Reported on 06/04/2023) 1 each 12   No current facility-administered medications for this visit.    Allergies as of 06/01/2024   (No Known Allergies)    Family History  Problem Relation Age of Onset   Hypertension Mother    COPD Mother    Lung cancer Mother     Colon cancer Maternal Uncle    Liver cancer Maternal Uncle    Hypertension Maternal Aunt    Diabetes Maternal Aunt    Esophageal cancer Neg Hx    Rectal cancer Neg Hx    Stomach cancer Neg Hx     Social History   Socioeconomic History   Marital status: Married    Spouse name: Not on file   Number of children: 1   Years of education: Not on file   Highest education level: Some college, no degree  Occupational History   Occupation: tax adviser at [employer]  Tobacco Use   Smoking status: Never   Smokeless tobacco: Never  Vaping Use   Vaping status: Never Used  Substance and Sexual Activity   Alcohol use: Not Currently    Alcohol/week: 1.0 standard drink of alcohol    Types: 1 Glasses of wine per week   Drug use: No   Sexual activity: Yes    Birth control/protection: None  Other Topics Concern   Not on file  Social History Narrative   ** Merged History Encounter **       Social Drivers of Health   Tobacco Use: Low Risk (12/21/2023)   Patient History    Smoking Tobacco Use: Never    Smokeless Tobacco Use: Never    Passive Exposure: Not on file  Financial Resource Strain: Not on file  Food Insecurity: Not on file  Transportation Needs: Not on file  Physical Activity: Not on file  Stress: Not on file  Social Connections: Unknown (09/09/2021)   [facility] from [facility]   Social Network    Social Network: Not on file  Intimate Partner Violence: Unknown (07/31/2021)   Received from [facility]   HITS    Physically Hurt: Not on file    Insult or Talk Down To: Not on file    Threaten Physical Harm: Not on file    Scream or Curse: Not on file  Depression (PHQ2-9): Low Risk (06/16/2022)   Depression (PHQ2-9)    PHQ-2 Score: 0  Alcohol Screen: Not on file  Housing: Not on file  Utilities: Not on file  Health Literacy: Not on file    Review of Systems:    Constitutional: No weight loss, fever or chills Skin: No rash  Cardiovascular: No chest  pain Respiratory: No SOB  Gastrointestinal: See HPI and otherwise negative Genitourinary: No dysuria Neurological: No headache, dizziness or syncope Musculoskeletal: No new muscle or joint pain Hematologic: No bleeding  Psychiatric: No history of depression or anxiety    Physical Exam:  Vital signs: BP 122/88   Pulse 89   Ht 5' 7 (1.702 m)   Wt 241 lb 3.2 oz (109.4 kg)   LMP 02/14/2015   BMI 37.78 kg/m    Constitutional:   Pleasant obese AA female appears to be in NAD, Well developed, Well nourished, alert and cooperative Head:  Normocephalic and atraumatic. Eyes:   PEERL, EOMI. No icterus. Conjunctiva pink. Ears:  Normal auditory acuity. Neck:  Supple Throat: Oral cavity and pharynx without inflammation, swelling or lesion.  Respiratory: Respirations even and unlabored. Lungs clear to auscultation bilaterally.   No wheezes, crackles, or rhonchi.  Cardiovascular: Normal S1, S2. No MRG. Regular rate and rhythm. No peripheral edema, cyanosis or pallor.  Gastrointestinal:  Soft, nondistended, nontender. No rebound or guarding. Normal bowel sounds. No appreciable masses or hepatomegaly. Rectal: External: Posterior anal fissure, minimally tender to palpation, appears chronic from skin appearance around it, no bleeding; internal: No mass, no residue Msk:  Symmetrical without gross deformities. Without edema, no deformity or joint abnormality.  Neurologic:  Alert and  oriented x4;  grossly normal neurologically.  Skin:   Dry and intact without significant lesions or rashes. Psychiatric: Demonstrates good judgement and reason without abnormal affect or behaviors.  MOST RECENT LABS: CBC    Component Value Date/Time   WBC 5.5 12/21/2023 1207   RBC 5.89 (H) 12/21/2023 1207   HGB 14.5 12/21/2023 1207   HCT 45.1 12/21/2023 1207   PLT 216 12/21/2023 1207   MCV 76.6 (L) 12/21/2023 1207   MCH 24.6 (L) 12/21/2023 1207   MCHC 32.2 12/21/2023 1207   RDW 16.3 (H) 12/21/2023 1207    LYMPHSABS 2.3 12/21/2023 1207   MONOABS 0.5 12/21/2023 1207   EOSABS 0.1 12/21/2023 1207   BASOSABS 0.0 12/21/2023 1207    CMP     Component Value Date/Time   NA 137 12/21/2023 1207   K 4.1 12/21/2023  1207   CL 104 12/21/2023 1207   CO2 21 (L) 12/21/2023 1207   GLUCOSE 103 (H) 12/21/2023 1207   BUN 12 12/21/2023 1207   CREATININE 0.76 12/21/2023 1207   CREATININE 0.88 11/25/2018 1459   CALCIUM  9.5 12/21/2023 1207   PROT 6.8 12/21/2023 1207   ALBUMIN 4.1 12/21/2023 1207   AST 29 12/21/2023 1207   ALT 33 12/21/2023 1207   ALKPHOS 80 12/21/2023 1207   BILITOT 0.4 12/21/2023 1207   GFRNONAA >60 12/21/2023 1207   GFRAA >60 05/18/2019 0904   Assessment & Plan Chronic anal fissure Chronic, longstanding anal fissure with recurrent symptoms and incomplete healing, likely exacerbated by ongoing constipation. Prolonged topical therapy is expected to improve healing and reduce recurrence.  - Performed anorectal examination to assess fissure status. - Prescribed diltiazem  with lidocaine  ointment for 6-8 weeks, to be applied 2-3 times daily. - Provided refills for diltiazem  with lidocaine  ointment. - Planned follow-up in 2 months to reassess healing and fissure status.  Constipation Chronic, severe constipation with infrequent bowel movements, contributing to fissure recurrence. She prefers non-laxative options and is amenable to dietary modification and fiber supplementation. - Recommended increased dietary fiber and fluid intake. - Suggested trial of over-the-counter fiber gummies as a palatable fiber supplement. - Provided education on maintaining regular bowel movements (at least every three days) to prevent fissure recurrence.  History of colonic adenomas History of precancerous colonic polyps with guideline-based need for surveillance colonoscopy. She is due for repeat procedure in April 2026 and agrees to scheduling. - Discussed need for surveillance colonoscopy due April 2026. -  Patient scheduled for colonoscopy with Dr. Avram in the [facility]  Did provide the patient with a detailed list of risks for the procedure and she agrees to proceed. Patient is appropriate for endoscopic procedure(s) in the ambulatory ([facility]) setting.   Patient to follow in clinic per recommendations after time of colonoscopy as well as with me for surveillance of fissure healing unless colonoscopy occurs around the same time.        Delon Failing, PA-C [facility] 05/31/2024, 4:11 PM  Cc: Jo Cyndee Jo JONELLE, DO  "

## 2024-06-01 ENCOUNTER — Ambulatory Visit: Admitting: Physician Assistant

## 2024-06-01 ENCOUNTER — Encounter: Payer: Self-pay | Admitting: Physician Assistant

## 2024-06-01 VITALS — BP 122/88 | HR 89 | Ht 67.0 in | Wt 241.2 lb

## 2024-06-01 DIAGNOSIS — K5909 Other constipation: Secondary | ICD-10-CM

## 2024-06-01 DIAGNOSIS — K602 Anal fissure, unspecified: Secondary | ICD-10-CM | POA: Diagnosis not present

## 2024-06-01 DIAGNOSIS — K625 Hemorrhage of anus and rectum: Secondary | ICD-10-CM

## 2024-06-01 DIAGNOSIS — Z860101 Personal history of adenomatous and serrated colon polyps: Secondary | ICD-10-CM | POA: Diagnosis not present

## 2024-06-01 MED ORDER — DILTIAZEM GEL 2 %: CUTANEOUS | 3 refills | Status: AC

## 2024-06-01 NOTE — Telephone Encounter (Signed)
 The pt has arrived for the appt

## 2024-06-01 NOTE — Patient Instructions (Addendum)
 We have sent a prescription for Diltiazem  2% gel with Lidocaine  to [facility] for you. Using your index finger, you should apply a small amount of medication inside the rectum up to your first knuckle/joint three times daily x 8 weeks.  [facility]'s information is below: Address: [address] [place], [place] [address]  [phone]  *Please DO NOT go directly from our office to pick up this medication! Give the pharmacy 1 day to process the prescription as this is compounded and takes time to make.  It has been recommended to you by your physician that you have a(n) colonoscopy completed. Per your request, we did not schedule the procedure(s) today. Please contact our office at [phone] should you decide to have the procedure completed. You will be scheduled for a pre-visit and procedure at that time.   _______________________________________________________  If your blood pressure at your visit was 140/90 or greater, please contact your primary care physician to follow up on this.  _______________________________________________________  If you are age 57 or older, your body mass index should be between 23-30. Your Body mass index is 37.78 kg/m. If this is out of the aforementioned range listed, please consider follow up with your Primary Care Provider.  If you are age 52 or younger, your body mass index should be between 19-25. Your Body mass index is 37.78 kg/m. If this is out of the aformentioned range listed, please consider follow up with your Primary Care Provider.   ________________________________________________________  The [facility] providers would like to encourage you to use MYCHART to communicate with providers for non-urgent requests or questions.  Due to long hold times on the telephone, sending your provider a message by [facility] may be a faster and more efficient way to get a response.  Please allow 48 business hours for a response.   Please remember that this is for non-urgent requests.  _______________________________________________________  [facility] is using a team-based approach to care.  Your team is made up of your doctor and two to three APPS. Our APPS (Nurse Practitioners and Physician Assistants) work with your physician to ensure care continuity for you. They are fully qualified to address your health concerns and develop a treatment plan. They communicate directly with your gastroenterologist to care for you. Seeing the Advanced Practice Practitioners on your physician's team can help you by facilitating care more promptly, often allowing for earlier appointments, access to diagnostic testing, procedures, and other specialty referrals.

## 2024-06-21 ENCOUNTER — Ambulatory Visit: Admitting: Gastroenterology
